# Patient Record
Sex: Female | Born: 1955
Health system: Southern US, Community
[De-identification: ages and names within clinical notes are randomized; demographics above are authoritative.]

## PROBLEM LIST (undated history)

## (undated) DIAGNOSIS — B2790 Infectious mononucleosis, unspecified without complication: Secondary | ICD-10-CM

## (undated) DIAGNOSIS — Z8719 Personal history of other diseases of the digestive system: Secondary | ICD-10-CM

## (undated) DIAGNOSIS — J40 Bronchitis, not specified as acute or chronic: Secondary | ICD-10-CM

## (undated) DIAGNOSIS — E785 Hyperlipidemia, unspecified: Secondary | ICD-10-CM

## (undated) DIAGNOSIS — E119 Type 2 diabetes mellitus without complications: Secondary | ICD-10-CM

## (undated) DIAGNOSIS — N200 Calculus of kidney: Secondary | ICD-10-CM

## (undated) DIAGNOSIS — J449 Chronic obstructive pulmonary disease, unspecified: Secondary | ICD-10-CM

## (undated) DIAGNOSIS — N189 Chronic kidney disease, unspecified: Secondary | ICD-10-CM

## (undated) DIAGNOSIS — T7840XA Allergy, unspecified, initial encounter: Secondary | ICD-10-CM

## (undated) DIAGNOSIS — I10 Essential (primary) hypertension: Secondary | ICD-10-CM

## (undated) DIAGNOSIS — H269 Unspecified cataract: Secondary | ICD-10-CM

## (undated) DIAGNOSIS — E039 Hypothyroidism, unspecified: Secondary | ICD-10-CM

## (undated) DIAGNOSIS — Z87442 Personal history of urinary calculi: Secondary | ICD-10-CM

## (undated) DIAGNOSIS — G709 Myoneural disorder, unspecified: Secondary | ICD-10-CM

## (undated) DIAGNOSIS — C50919 Malignant neoplasm of unspecified site of unspecified female breast: Secondary | ICD-10-CM

## (undated) DIAGNOSIS — C801 Malignant (primary) neoplasm, unspecified: Secondary | ICD-10-CM

## (undated) DIAGNOSIS — K219 Gastro-esophageal reflux disease without esophagitis: Secondary | ICD-10-CM

## (undated) DIAGNOSIS — J189 Pneumonia, unspecified organism: Secondary | ICD-10-CM

## (undated) DIAGNOSIS — E079 Disorder of thyroid, unspecified: Secondary | ICD-10-CM

## (undated) DIAGNOSIS — E669 Obesity, unspecified: Secondary | ICD-10-CM

## (undated) HISTORY — DX: Disorder of thyroid, unspecified: E07.9

## (undated) HISTORY — DX: Obesity, unspecified: E66.9

## (undated) HISTORY — PX: BREAST BIOPSY: SHX20

## (undated) HISTORY — DX: Allergy, unspecified, initial encounter: T78.40XA

## (undated) HISTORY — DX: Chronic obstructive pulmonary disease, unspecified: J44.9

## (undated) HISTORY — DX: Hyperlipidemia, unspecified: E78.5

## (undated) HISTORY — DX: Gastro-esophageal reflux disease without esophagitis: K21.9

## (undated) HISTORY — PX: TUBAL LIGATION: SHX77

## (undated) HISTORY — DX: Bronchitis, not specified as acute or chronic: J40

## (undated) HISTORY — DX: Unspecified cataract: H26.9

## (undated) HISTORY — DX: Chronic kidney disease, unspecified: N18.9

## (undated) HISTORY — PX: BREAST LUMPECTOMY: SHX2

## (undated) HISTORY — DX: Essential (primary) hypertension: I10

## (undated) HISTORY — DX: Type 2 diabetes mellitus without complications: E11.9

## (undated) HISTORY — PX: TONSILLECTOMY AND ADENOIDECTOMY: SUR1326

---

## 1999-03-11 ENCOUNTER — Other Ambulatory Visit: Admission: RE | Admit: 1999-03-11 | Discharge: 1999-03-11 | Payer: Self-pay | Admitting: Obstetrics and Gynecology

## 2000-03-15 ENCOUNTER — Other Ambulatory Visit: Admission: RE | Admit: 2000-03-15 | Discharge: 2000-03-15 | Payer: Self-pay | Admitting: Obstetrics and Gynecology

## 2001-12-13 ENCOUNTER — Encounter: Payer: Self-pay | Admitting: Obstetrics and Gynecology

## 2001-12-13 ENCOUNTER — Ambulatory Visit (HOSPITAL_COMMUNITY): Admission: RE | Admit: 2001-12-13 | Discharge: 2001-12-13 | Payer: Self-pay | Admitting: Obstetrics and Gynecology

## 2002-01-01 ENCOUNTER — Other Ambulatory Visit: Admission: RE | Admit: 2002-01-01 | Discharge: 2002-01-01 | Payer: Self-pay | Admitting: Obstetrics and Gynecology

## 2007-06-07 ENCOUNTER — Emergency Department (HOSPITAL_COMMUNITY): Admission: EM | Admit: 2007-06-07 | Discharge: 2007-06-07 | Payer: Self-pay | Admitting: Emergency Medicine

## 2007-06-14 ENCOUNTER — Emergency Department (HOSPITAL_COMMUNITY): Admission: EM | Admit: 2007-06-14 | Discharge: 2007-06-14 | Payer: Self-pay | Admitting: Emergency Medicine

## 2007-06-20 ENCOUNTER — Ambulatory Visit (HOSPITAL_COMMUNITY): Admission: RE | Admit: 2007-06-20 | Discharge: 2007-06-20 | Payer: Self-pay | Admitting: Internal Medicine

## 2007-06-22 ENCOUNTER — Ambulatory Visit (HOSPITAL_COMMUNITY): Admission: RE | Admit: 2007-06-22 | Discharge: 2007-06-22 | Payer: Self-pay | Admitting: Internal Medicine

## 2007-12-06 ENCOUNTER — Ambulatory Visit (HOSPITAL_COMMUNITY): Admission: RE | Admit: 2007-12-06 | Discharge: 2007-12-06 | Payer: Self-pay | Admitting: Obstetrics and Gynecology

## 2008-12-10 ENCOUNTER — Ambulatory Visit (HOSPITAL_COMMUNITY): Admission: RE | Admit: 2008-12-10 | Discharge: 2008-12-10 | Payer: Self-pay | Admitting: Obstetrics and Gynecology

## 2009-02-15 HISTORY — PX: VAGINAL HYSTERECTOMY: SUR661

## 2009-09-30 ENCOUNTER — Ambulatory Visit (HOSPITAL_COMMUNITY): Admission: RE | Admit: 2009-09-30 | Discharge: 2009-10-01 | Payer: Self-pay | Admitting: Obstetrics and Gynecology

## 2009-09-30 ENCOUNTER — Encounter (INDEPENDENT_AMBULATORY_CARE_PROVIDER_SITE_OTHER): Payer: Self-pay | Admitting: Obstetrics and Gynecology

## 2009-12-18 ENCOUNTER — Ambulatory Visit (HOSPITAL_COMMUNITY): Admission: RE | Admit: 2009-12-18 | Discharge: 2009-12-18 | Payer: Self-pay | Admitting: Obstetrics and Gynecology

## 2010-03-30 ENCOUNTER — Other Ambulatory Visit (HOSPITAL_COMMUNITY): Payer: Self-pay | Admitting: Orthopedic Surgery

## 2010-03-30 DIAGNOSIS — M25512 Pain in left shoulder: Secondary | ICD-10-CM

## 2010-04-02 ENCOUNTER — Ambulatory Visit (HOSPITAL_COMMUNITY)
Admission: RE | Admit: 2010-04-02 | Discharge: 2010-04-02 | Disposition: A | Discharge status: Home / Self Care | Payer: 59 | Source: Ambulatory Visit | Attending: Orthopedic Surgery | Admitting: Orthopedic Surgery

## 2010-04-02 DIAGNOSIS — S42293A Other displaced fracture of upper end of unspecified humerus, initial encounter for closed fracture: Secondary | ICD-10-CM | POA: Insufficient documentation

## 2010-04-02 DIAGNOSIS — S46819A Strain of other muscles, fascia and tendons at shoulder and upper arm level, unspecified arm, initial encounter: Secondary | ICD-10-CM | POA: Insufficient documentation

## 2010-04-02 DIAGNOSIS — X58XXXA Exposure to other specified factors, initial encounter: Secondary | ICD-10-CM | POA: Insufficient documentation

## 2010-04-02 DIAGNOSIS — M25512 Pain in left shoulder: Secondary | ICD-10-CM

## 2010-04-02 DIAGNOSIS — Y939 Activity, unspecified: Secondary | ICD-10-CM | POA: Insufficient documentation

## 2010-04-02 DIAGNOSIS — Y929 Unspecified place or not applicable: Secondary | ICD-10-CM | POA: Insufficient documentation

## 2010-04-02 DIAGNOSIS — Y998 Other external cause status: Secondary | ICD-10-CM | POA: Insufficient documentation

## 2010-05-01 LAB — CBC
HCT: 34.9 % — ABNORMAL LOW (ref 36.0–46.0)
MCH: 29.5 pg (ref 26.0–34.0)
MCV: 88.3 fL (ref 78.0–100.0)
Platelets: 219 10*3/uL (ref 150–400)
RBC: 3.96 MIL/uL (ref 3.87–5.11)
RDW: 13.3 % (ref 11.5–15.5)
WBC: 11.8 10*3/uL — ABNORMAL HIGH (ref 4.0–10.5)

## 2010-05-01 LAB — SURGICAL PCR SCREEN
MRSA, PCR: NEGATIVE
Staphylococcus aureus: NEGATIVE

## 2010-05-01 LAB — URINE CULTURE
Colony Count: NO GROWTH
Culture  Setup Time: 201108171112

## 2010-05-28 ENCOUNTER — Inpatient Hospital Stay (INDEPENDENT_AMBULATORY_CARE_PROVIDER_SITE_OTHER)
Admission: RE | Admit: 2010-05-28 | Discharge: 2010-05-28 | Disposition: A | Discharge status: Home / Self Care | Payer: Commercial Managed Care - PPO | Source: Ambulatory Visit | Attending: Emergency Medicine | Admitting: Emergency Medicine

## 2010-05-28 DIAGNOSIS — W19XXXA Unspecified fall, initial encounter: Secondary | ICD-10-CM

## 2010-05-28 DIAGNOSIS — S01501A Unspecified open wound of lip, initial encounter: Secondary | ICD-10-CM

## 2010-11-10 ENCOUNTER — Other Ambulatory Visit (HOSPITAL_COMMUNITY): Payer: Self-pay | Admitting: Obstetrics and Gynecology

## 2010-11-10 DIAGNOSIS — Z1231 Encounter for screening mammogram for malignant neoplasm of breast: Secondary | ICD-10-CM

## 2010-11-10 LAB — CSF CELL COUNT WITH DIFFERENTIAL
RBC Count, CSF: 3025 — ABNORMAL HIGH
Tube #: 1
WBC, CSF: 1

## 2010-11-10 LAB — POCT I-STAT, CHEM 8
BUN: 24 — ABNORMAL HIGH
Chloride: 106
HCT: 44
Potassium: 4.2
Sodium: 138

## 2010-11-10 LAB — CBC
Hemoglobin: 14.6
MCHC: 33.7
RDW: 12.8

## 2010-11-10 LAB — GRAM STAIN

## 2010-11-10 LAB — CSF CULTURE W GRAM STAIN: Culture: NO GROWTH

## 2010-11-10 LAB — DIFFERENTIAL
Basophils Absolute: 0.1
Basophils Relative: 1
Lymphocytes Relative: 40
Neutro Abs: 4.7
Neutrophils Relative %: 49

## 2010-11-10 LAB — PROTEIN AND GLUCOSE, CSF: Total  Protein, CSF: 42

## 2010-12-29 ENCOUNTER — Ambulatory Visit (HOSPITAL_COMMUNITY)
Admission: RE | Admit: 2010-12-29 | Discharge: 2010-12-29 | Disposition: A | Discharge status: Home / Self Care | Payer: 59 | Source: Ambulatory Visit | Attending: Obstetrics and Gynecology | Admitting: Obstetrics and Gynecology

## 2010-12-29 DIAGNOSIS — Z1231 Encounter for screening mammogram for malignant neoplasm of breast: Secondary | ICD-10-CM | POA: Insufficient documentation

## 2011-01-11 ENCOUNTER — Encounter: Payer: 59 | Attending: Internal Medicine | Admitting: Dietician

## 2011-01-11 ENCOUNTER — Encounter: Payer: Self-pay | Admitting: Dietician

## 2011-01-11 DIAGNOSIS — Z713 Dietary counseling and surveillance: Secondary | ICD-10-CM | POA: Insufficient documentation

## 2011-01-11 DIAGNOSIS — I10 Essential (primary) hypertension: Secondary | ICD-10-CM | POA: Insufficient documentation

## 2011-01-11 DIAGNOSIS — E785 Hyperlipidemia, unspecified: Secondary | ICD-10-CM | POA: Insufficient documentation

## 2011-01-11 DIAGNOSIS — E119 Type 2 diabetes mellitus without complications: Secondary | ICD-10-CM | POA: Insufficient documentation

## 2011-01-11 NOTE — Progress Notes (Signed)
  Medical Nutrition Therapy:  Appt start time: 1530 end time:  1700.  Assessment:  Primary concerns today: Blood glucose control and history of pre-diabetes.  Has a desire to learn more about taking care of her diabetes, loosing weight and getting her blood glucose under control.  Current HgA1C is 5.7%.  She is looking to get this number lower.  Gives a history of having lost 24 lb since August 2012.  Seeing Dr. Allena Napoleon and is on a regimen for weight loss and supplements to assist in loosing weight and obtaining a healthy state.  Her husband has done a great deal of reading regarding good glucose control, and is trying to utilized the advice of Dr. Valora Corporal  for glucose control.  She reports that he has been successful with the low carb, lean meat approach for glucose control.  She is seeking to eat healthy, but needs some education regarding label reading and food prep and meal planning. Use organic products when affordable.   MEDICATIONS: Diabetes:  Metformin ER 500 mg twice daily.  DIETARY INTAKE:  24-hr recall:  B (6:00-9:00 AM): Protein Smoothie containing whey protein powder, 1 cup of berries, and flax seed.  Estimated in the past that this was 40 gm of CHO.  Snk (Mid- AM) :Usually does not have a snack at this time.  L (Mid-day): Lean meat and a salad or steamed veggies.  Snk (Mid - PM): 4 oz organic apple D (6:00-8:00 PM): Lean meat, steamed non-starchy vegetable, side salad with a dressing. Or Masa Lubin have a cole slaw with a dressing.  Snk (9 PM): usually no snack. Beverages: water or coffee with a small serving of 1/2 and !/2. Recent physical activity: Brisk walk with husband for 60 minutes 5 times per week.  Estimated energy needs:64 inches and 169.3 lb.  Adjusted weight=136 lb. (62 kg) Approximately 1300 calories-1400 calories for weight loss.  Will aim more for 1300-1350.   135-145 g carbohydrates per day 95-100 g protein 34-36 g fat  Progress Towards Goal(s):  In  progress.   Nutritional Diagnosis:  [place]-2.1 Inpaired nutrition utilization As related to glucose.  As evidenced by fasting glucose greater than 100 mg..    Intervention:  Nutrition Increase label reading and continue to increase the complex CHO in the diet.  Use high fiber grains and cereals and more of the non-starchy vegetables and the lower glycemic fruits.  Practice measuring foods at home and use the information when eating at the cafeteria.    Handouts given during visit include:  ADA Living Well with Diabetes  Novo Nordisk Carbohydrate Counting Guide.  Diabetes/Glucose Control Work Building surveyor.  Food log for counting carbs.  Monitoring/Evaluation:  Dietary intake, exercise, blood glucose records , and body weight in 3-6 months.

## 2011-01-11 NOTE — Patient Instructions (Signed)
   Continue to read your food labels.  Try to eat regular meals and snacks.  Try to keep your carb to 15-30 gm of carb for meals and 15 gm for snacks.  Use your yellow card for carb counting and the carb counting book for references.  Continue to work at [employer] and snacks.  Plan to cook ahead.  Keep records of the food you ate and the effects on your glucose level.  This will help for planning.  Keep up your good work, you and your husband have made a good start.  Always have fiber in your foods.  For breads and cereals, the higher the fiber, the better for your glucose levels.  On the food label, keep the sugar at 0-9 gms per serving.  Continue to use the Splenda and Stevia in moderation.  When you loose your weight, and they explore the use of small amounts of Agava Nector for sweetening.  Pardon my spelling.  Continue your regular walking.  Plan to touch base with me sometime in the next 3-6 months.  E-mail me with the results of your A1C.

## 2011-02-01 ENCOUNTER — Ambulatory Visit: Payer: 59 | Admitting: Physical Therapy

## 2011-02-04 ENCOUNTER — Ambulatory Visit: Payer: 59 | Attending: Orthopedic Surgery | Admitting: Physical Therapy

## 2011-02-04 DIAGNOSIS — M25619 Stiffness of unspecified shoulder, not elsewhere classified: Secondary | ICD-10-CM | POA: Insufficient documentation

## 2011-02-04 DIAGNOSIS — M25519 Pain in unspecified shoulder: Secondary | ICD-10-CM | POA: Insufficient documentation

## 2011-02-04 DIAGNOSIS — IMO0001 Reserved for inherently not codable concepts without codable children: Secondary | ICD-10-CM | POA: Insufficient documentation

## 2011-02-22 ENCOUNTER — Ambulatory Visit: Payer: 59 | Attending: Orthopedic Surgery | Admitting: Physical Therapy

## 2011-02-22 DIAGNOSIS — IMO0001 Reserved for inherently not codable concepts without codable children: Secondary | ICD-10-CM | POA: Insufficient documentation

## 2011-02-22 DIAGNOSIS — M25619 Stiffness of unspecified shoulder, not elsewhere classified: Secondary | ICD-10-CM | POA: Insufficient documentation

## 2011-02-22 DIAGNOSIS — M25519 Pain in unspecified shoulder: Secondary | ICD-10-CM | POA: Insufficient documentation

## 2011-02-24 ENCOUNTER — Ambulatory Visit: Payer: 59 | Admitting: Physical Therapy

## 2011-03-01 ENCOUNTER — Ambulatory Visit: Payer: 59 | Admitting: Physical Therapy

## 2011-03-03 ENCOUNTER — Ambulatory Visit: Payer: 59 | Admitting: Physical Therapy

## 2011-03-10 ENCOUNTER — Encounter: Payer: 59 | Admitting: Physical Therapy

## 2011-03-15 ENCOUNTER — Ambulatory Visit: Payer: 59 | Admitting: Physical Therapy

## 2011-03-17 ENCOUNTER — Ambulatory Visit: Payer: 59 | Admitting: Physical Therapy

## 2011-03-22 ENCOUNTER — Ambulatory Visit: Payer: 59 | Attending: Orthopedic Surgery | Admitting: Physical Therapy

## 2011-03-22 DIAGNOSIS — M25619 Stiffness of unspecified shoulder, not elsewhere classified: Secondary | ICD-10-CM | POA: Insufficient documentation

## 2011-03-22 DIAGNOSIS — IMO0001 Reserved for inherently not codable concepts without codable children: Secondary | ICD-10-CM | POA: Insufficient documentation

## 2011-03-22 DIAGNOSIS — M25519 Pain in unspecified shoulder: Secondary | ICD-10-CM | POA: Insufficient documentation

## 2011-03-24 ENCOUNTER — Encounter: Payer: 59 | Admitting: Physical Therapy

## 2011-03-29 ENCOUNTER — Ambulatory Visit: Payer: 59 | Admitting: Physical Therapy

## 2011-03-31 ENCOUNTER — Ambulatory Visit: Payer: 59 | Admitting: Physical Therapy

## 2011-04-07 ENCOUNTER — Encounter: Payer: 59 | Admitting: Physical Therapy

## 2012-01-25 ENCOUNTER — Other Ambulatory Visit: Payer: Self-pay | Admitting: Obstetrics and Gynecology

## 2012-01-25 DIAGNOSIS — Z1231 Encounter for screening mammogram for malignant neoplasm of breast: Secondary | ICD-10-CM

## 2012-02-24 ENCOUNTER — Ambulatory Visit: Payer: 59 | Admitting: Obstetrics and Gynecology

## 2012-02-24 ENCOUNTER — Encounter: Payer: Self-pay | Admitting: Obstetrics and Gynecology

## 2012-02-24 ENCOUNTER — Ambulatory Visit (INDEPENDENT_AMBULATORY_CARE_PROVIDER_SITE_OTHER): Payer: 59 | Admitting: Obstetrics and Gynecology

## 2012-02-24 VITALS — BP 110/62 | Ht 63.51 in | Wt 157.0 lb

## 2012-02-24 DIAGNOSIS — N951 Menopausal and female climacteric states: Secondary | ICD-10-CM

## 2012-02-24 DIAGNOSIS — Z01419 Encounter for gynecological examination (general) (routine) without abnormal findings: Secondary | ICD-10-CM

## 2012-02-24 DIAGNOSIS — Z90710 Acquired absence of both cervix and uterus: Secondary | ICD-10-CM | POA: Insufficient documentation

## 2012-02-24 NOTE — Progress Notes (Signed)
Subjective:    Michelle [name] is a 57 y.o. female . who presents for annual exam.  The patient has no complaints today. Her menopausal symptoms are well controlled by a bioidentical hormone regimen prescribed by Dr. Ananias Pilgrim.  Last Pap: 01/03/2009 WNL: Yes Regular Periods:no Contraception: BTL, hyst  Monthly Breast exam:yes Tetanus<63yrs:yes Nl.Bladder Function:yes Daily BMs:yes Healthy Diet:yes Calcium:yes Mammogram:yes Date of Mammogram: 01/01/2011 Mammo sched next week Exercise:yes Have often Exercise: 5x/weekly Seatbelt: yes Abuse at home: no Stressful work:no Sigmoid-colonoscopy: none Bone Density: Yes 03/02/2010-osteopenia Pt has to have DEXA sched thru [facility] per ins co. PCP: Dr. Ananias Pilgrim Change in PMH: no change Change in FMH:no change BMI-28  The following portions of the patient's history were reviewed and updated as appropriate: allergies, current medications, past family history, past medical history, past social history, past surgical history and problem list.  Review of Systems Pertinent items are noted in HPI. Gastrointestinal:No change in bowel habits, no abdominal pain, no rectal bleeding Genitourinary:negative for dysuria, frequency, hematuria, nocturia and urinary incontinence    Objective:     BP 110/62  Ht 5' 3.51" (1.613 m)  Wt 157 lb (71.215 kg)  BMI 27.37 kg/m2  Weight:  Wt Readings from Last 1 Encounters:  02/24/12 157 lb (71.215 kg)     BMI: Body mass index is 27.37 kg/(m^2). General Appearance: Alert, appropriate appearance for age. No acute distress HEENT: Grossly normal Neck / Thyroid: Supple, no masses, nodes or enlargement Lungs: clear to auscultation bilaterally Back: No CVA tenderness Breast Exam: No masses or nodes.No dimpling, nipple retraction or discharge. Cardiovascular: Regular rate and rhythm. S1, S2, no murmur Gastrointestinal: Soft, non-tender, no masses or organomegaly Pelvic Exam: External genitalia: normal general  appearance Vaginal: atrophic mucosa and vaginal vault, Well healed and well suspended Cervix: removed surgically Adnexa: No palpable masses Uterus: removed surgically Rectovaginal: normal rectal, no masses Lymphatic Exam: Non-palpable nodes in neck, clavicular, axillary, or inguinal regions Skin: no rash or abnormalities Neurologic: Normal gait and speech, no tremor  Psychiatric: Alert and oriented, appropriate affect.    Urinalysis:Not done    Assessment:    Normal gyn exam Hormone replacement therapy, combination of estrogen, progesterone, and asked Dr. Ferne Reus compounded by Dr. Marilynne Halsted Menopause    Plan:   mammogram return annually or prn    Dierdre Forth MD

## 2012-02-29 ENCOUNTER — Ambulatory Visit (HOSPITAL_COMMUNITY)
Admission: RE | Admit: 2012-02-29 | Discharge: 2012-02-29 | Disposition: A | Discharge status: Home / Self Care | Payer: 59 | Source: Ambulatory Visit | Attending: Obstetrics and Gynecology | Admitting: Obstetrics and Gynecology

## 2012-02-29 ENCOUNTER — Ambulatory Visit (HOSPITAL_COMMUNITY): Payer: 59

## 2012-02-29 DIAGNOSIS — Z1231 Encounter for screening mammogram for malignant neoplasm of breast: Secondary | ICD-10-CM | POA: Insufficient documentation

## 2013-02-22 ENCOUNTER — Other Ambulatory Visit: Payer: Self-pay | Admitting: Obstetrics and Gynecology

## 2013-02-22 DIAGNOSIS — M8580 Other specified disorders of bone density and structure, unspecified site: Secondary | ICD-10-CM

## 2013-02-22 DIAGNOSIS — Z1231 Encounter for screening mammogram for malignant neoplasm of breast: Secondary | ICD-10-CM

## 2013-03-06 ENCOUNTER — Ambulatory Visit (HOSPITAL_COMMUNITY)
Admission: RE | Admit: 2013-03-06 | Discharge: 2013-03-06 | Disposition: A | Discharge status: Home / Self Care | Payer: 59 | Source: Ambulatory Visit | Attending: Obstetrics and Gynecology | Admitting: Obstetrics and Gynecology

## 2013-03-06 DIAGNOSIS — Z1231 Encounter for screening mammogram for malignant neoplasm of breast: Secondary | ICD-10-CM | POA: Insufficient documentation

## 2013-03-06 DIAGNOSIS — M8580 Other specified disorders of bone density and structure, unspecified site: Secondary | ICD-10-CM

## 2013-03-26 ENCOUNTER — Ambulatory Visit (INDEPENDENT_AMBULATORY_CARE_PROVIDER_SITE_OTHER): Payer: 59 | Admitting: Internal Medicine

## 2013-03-26 ENCOUNTER — Encounter: Payer: Self-pay | Admitting: Internal Medicine

## 2013-03-26 VITALS — BP 147/71 | HR 103 | Temp 98.2°F | Resp 18 | Ht 63.0 in | Wt 165.0 lb

## 2013-03-26 DIAGNOSIS — M8580 Other specified disorders of bone density and structure, unspecified site: Secondary | ICD-10-CM | POA: Insufficient documentation

## 2013-03-26 DIAGNOSIS — I10 Essential (primary) hypertension: Secondary | ICD-10-CM | POA: Insufficient documentation

## 2013-03-26 DIAGNOSIS — E1169 Type 2 diabetes mellitus with other specified complication: Secondary | ICD-10-CM | POA: Insufficient documentation

## 2013-03-26 DIAGNOSIS — Z139 Encounter for screening, unspecified: Secondary | ICD-10-CM

## 2013-03-26 DIAGNOSIS — M949 Disorder of cartilage, unspecified: Secondary | ICD-10-CM

## 2013-03-26 DIAGNOSIS — M899 Disorder of bone, unspecified: Secondary | ICD-10-CM

## 2013-03-26 DIAGNOSIS — E785 Hyperlipidemia, unspecified: Secondary | ICD-10-CM | POA: Insufficient documentation

## 2013-03-26 DIAGNOSIS — N951 Menopausal and female climacteric states: Secondary | ICD-10-CM

## 2013-03-26 DIAGNOSIS — E039 Hypothyroidism, unspecified: Secondary | ICD-10-CM | POA: Insufficient documentation

## 2013-03-26 NOTE — Progress Notes (Signed)
Subjective:    Patient ID: Michelle [name], female    DOB: [birth year], 58 y.o.   MRN: [id number]  HPI  Lester is here for first visit  Primary care.  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   [facility]  "borderline Diabetes" diagnosed Dr.VAughn,  HTN (on no meds now ),  Hyperlipidemia,  Symptomatic menopause  (on compounded HT Dr. Sharol Roussel),  Osteopenia  ,  Hypothyroidism  (Dx Dr. Sharol Roussel by bloodwork)  She is S/P hysterectomy signficant uterine prolapse per pt report  On HT for approx 2 years per pt report.   HTN:  She tells me she was able to lose over 20 lbs in the last year and subsequently able to stop her BP medication.  Since Thanksgiving she has gained 10 lbs back.  She is asymptomatic  She has never had a colonoscopy  Allergies  Allergen Reactions  . Shrimp [Shellfish Allergy] Itching    Allergic to shrimp.  Wheezing and tightness in chest.  . Tdap [Diphth-Acell Pertussis-Tetanus] Other (See Comments)    Change in level of consciousness  . Influenza Vaccine Live     Sever flu symptoms, chest tightness, wheezing  . Latex Rash  . Vitamin B12     Swelling in extremity and general malize.   Past Medical History  Diagnosis Date  . Asthma   . Hyperlipidemia   . Hypertension   . Thyroid disease   . Obesity    Past Surgical History  Procedure Laterality Date  . Tonsillectomy and adenoidectomy  childhood  . Vaginal hysterectomy    . Tubal ligation     History   Social History  . Marital Status: Married    Spouse Name: N/A    Number of Children: N/A  . Years of Education: N/A   Occupational History  . Not on file.   Social History Main Topics  . Smoking status: Never Smoker   . Smokeless tobacco: Never Used  . Alcohol Use: No  . Drug Use: No  . Sexual Activity: Not on file   Other Topics Concern  . Not on file   Social History Narrative  . No narrative on file   Family History  Problem Relation Age of Onset  . Heart disease Mother   .  Hyperlipidemia Mother   . Stroke Mother   . Hypertension Mother    Patient Active Problem List   Diagnosis Date Noted  . Menopausal symptoms 02/24/2012  . Status post hysterectomy 02/24/2012   Current Outpatient Prescriptions on File Prior to Visit  Medication Sig Dispense Refill  . ALPHA LIPOIC ACID PO Take 1-2 capsules by mouth 2 (two) times daily.        . AMBULATORY NON FORMULARY MEDICATION Medication Name: Biest/Progesterone/Testosterone/DHEA 2.5/250/0.5/2mg       . aspirin 81 MG EC tablet Take 81 mg by mouth daily. Swallow whole.       . Cholecalciferol (VITAMIN D) 2000 UNITS tablet Take 2,000 Units by mouth daily.        . metformin (FORTAMET) 500 MG (OSM) 24 hr tablet Take 500 mg by mouth 2 (two) times daily with a meal.       . MULTIPLE VITAMIN PO Take 1 tablet by mouth daily.        . Omega 3-6-9 Fatty Acids LIQD Take 10 mLs by mouth daily.        Marland Kitchen PROBIOTIC CAPS Take 1 capsule by mouth daily.        Marland Kitchen  simvastatin (ZOCOR) 40 MG tablet Take 20 mg by mouth at bedtime.        No current facility-administered medications on file prior to visit.      Review of Systems See HPI    Objective:   Physical Exam Physical Exam  Nursing note and vitals reviewed.  Constitutional: She is oriented to person, place, and time. She appears well-developed and well-nourished.  HENT:  Head: Normocephalic and atraumatic.  Cardiovascular: Normal rate and regular rhythm. Exam reveals no gallop and no friction rub.  No murmur heard.  Pulmonary/Chest: Breath sounds normal. She has no wheezes. She has no rales.  Neurological: She is alert and oriented to person, place, and time.  Skin: Skin is warm and dry.  Psychiatric: She has a normal mood and affect. Her behavior is normal.          Assessment & Plan:  Hyperlipidemia  Will check fasting levels  HTN  She is offf meds now repeat BP  142/80.  Will moniter for now  Symptomatic menopause:  I counseled pt that I prescribe FDA approved  HT and do not prescribe compounded HT.   Explained that we do not know long term risks of testosterone in women.  She wishes to conitnue with Dr. Sharol Roussel for this.  S/P hysterectomy  Osteopenia  03/06/2013  Normal dexa  She has never had a colonoscopy.  She would like to see a woman.  Will refer.   She is to schedule CPE with me

## 2013-03-26 NOTE — Patient Instructions (Signed)
Schedule CPE

## 2013-03-27 ENCOUNTER — Telehealth: Payer: Self-pay | Admitting: *Deleted

## 2013-03-27 ENCOUNTER — Telehealth: Payer: Self-pay | Admitting: Internal Medicine

## 2013-03-27 DIAGNOSIS — Z1211 Encounter for screening for malignant neoplasm of colon: Secondary | ICD-10-CM

## 2013-03-27 NOTE — Telephone Encounter (Signed)
Called pt regarding a referral to GI for colonoscopy. Pt was unable to provide appt info for referral and states that she will call back

## 2013-03-27 NOTE — Telephone Encounter (Signed)
error 

## 2013-03-27 NOTE — Telephone Encounter (Signed)
duplicate

## 2013-03-27 NOTE — Telephone Encounter (Signed)
Michelle [name]  I spoke with pt about getting a screening colonoscopy  - she has never had one at the age of 66.    She wanted to see a female.  Please call pt and see if you can set up with Dr. Collene Mares or Dr. Olevia Perches of [place] back to me her response

## 2013-03-27 NOTE — Telephone Encounter (Signed)
Pt returned call would like to schedule her colonoscopy in May. Referral completed in Epic. Dr Nichola Sizer schedule for colonoscopies only goes out until April. Pt was given the number and instructed to call in March to schedule her appt.

## 2013-12-17 ENCOUNTER — Encounter: Payer: Self-pay | Admitting: Internal Medicine

## 2014-01-07 ENCOUNTER — Encounter (HOSPITAL_COMMUNITY): Payer: 59

## 2014-01-07 ENCOUNTER — Encounter: Payer: 59 | Admitting: Surgery

## 2014-02-19 ENCOUNTER — Other Ambulatory Visit (HOSPITAL_COMMUNITY): Payer: Self-pay | Admitting: Obstetrics and Gynecology

## 2014-02-19 DIAGNOSIS — Z1231 Encounter for screening mammogram for malignant neoplasm of breast: Secondary | ICD-10-CM

## 2014-02-28 ENCOUNTER — Encounter: Payer: Self-pay | Admitting: *Deleted

## 2014-03-11 ENCOUNTER — Encounter: Payer: Self-pay | Admitting: Internal Medicine

## 2014-03-11 ENCOUNTER — Ambulatory Visit (INDEPENDENT_AMBULATORY_CARE_PROVIDER_SITE_OTHER): Payer: 59 | Admitting: Internal Medicine

## 2014-03-11 ENCOUNTER — Encounter: Payer: Self-pay | Admitting: *Deleted

## 2014-03-11 VITALS — BP 154/80 | HR 106 | Temp 98.6°F | Resp 18 | Ht 63.5 in | Wt 170.0 lb

## 2014-03-11 DIAGNOSIS — R0602 Shortness of breath: Secondary | ICD-10-CM

## 2014-03-11 DIAGNOSIS — R0789 Other chest pain: Secondary | ICD-10-CM

## 2014-03-11 DIAGNOSIS — R05 Cough: Secondary | ICD-10-CM

## 2014-03-11 DIAGNOSIS — R059 Cough, unspecified: Secondary | ICD-10-CM

## 2014-03-11 DIAGNOSIS — R7303 Prediabetes: Secondary | ICD-10-CM | POA: Insufficient documentation

## 2014-03-11 MED ORDER — PREDNISONE 20 MG PO TABS: ORAL_TABLET | ORAL | Status: DC

## 2014-03-11 MED ORDER — ALBUTEROL SULFATE HFA 108 (90 BASE) MCG/ACT IN AERS: INHALATION_SPRAY | RESPIRATORY_TRACT | Status: DC

## 2014-03-11 MED ORDER — METHYLPREDNISOLONE ACETATE 80 MG/ML IJ SUSP
160.0000 mg | Freq: Once | INTRAMUSCULAR | Status: AC
  Administered 2014-03-11: 160 mg via INTRAMUSCULAR

## 2014-03-11 MED ORDER — ALBUTEROL SULFATE (2.5 MG/3ML) 0.083% IN NEBU
2.5000 mg | INHALATION_SOLUTION | Freq: Once | RESPIRATORY_TRACT | Status: AC
  Administered 2014-03-11: 2.5 mg via RESPIRATORY_TRACT

## 2014-03-11 MED ORDER — AZITHROMYCIN 250 MG PO TABS: ORAL_TABLET | ORAL | Status: DC

## 2014-03-11 NOTE — Progress Notes (Signed)
Subjective:    Patient ID: Michelle [name], female    DOB: 1955/03/15, 59 y.o.   MRN: [id number]  HPI  For past 10 days increasing cough yellow sputum,  Wheezing and SOB.  No documented fever no chest pain   Years ago was told she had Asthma.   Has been getting care through [facility] and trying "natural meds"  See BP  Mother has htn.  Pt was on BP med but stopped  "after Dr. Sharol Roussel fixed my menopause"     Has not had any BP meds in several years  Allergies  Allergen Reactions  . Shrimp [Shellfish Allergy] Itching    Allergic to shrimp.  Wheezing and tightness in chest.  . Tdap [Diphth-Acell Pertussis-Tetanus] Other (See Comments)    Change in level of consciousness  . Influenza Vaccine Live     Sever flu symptoms, chest tightness, wheezing  . Latex Rash  . Vitamin B12     Swelling in extremity and general malize.   Past Medical History  Diagnosis Date  . Asthma   . Hyperlipidemia   . Hypertension   . Thyroid disease   . Obesity    Past Surgical History  Procedure Laterality Date  . Tonsillectomy and adenoidectomy  childhood  . Vaginal hysterectomy    . Tubal ligation     History   Social History  . Marital Status: Married    Spouse Name: N/A    Number of Children: N/A  . Years of Education: N/A   Occupational History  . Not on file.   Social History Main Topics  . Smoking status: Never Smoker   . Smokeless tobacco: Never Used  . Alcohol Use: No  . Drug Use: No  . Sexual Activity: Yes   Other Topics Concern  . Not on file   Social History Narrative   Family History  Problem Relation Age of Onset  . Heart disease Mother   . Hyperlipidemia Mother   . Stroke Mother   . Hypertension Mother    Patient Active Problem List   Diagnosis Date Noted  . Borderline diabetes  DX Dr Sharol Roussel 03/11/2014  . HTN (hypertension) 03/26/2013  . Hyperlipidemia 03/26/2013  . Osteopenia 03/26/2013  . Hypothyroidism 03/26/2013  . Menopausal symptoms 02/24/2012  .  Status post hysterectomy 02/24/2012   Current Outpatient Prescriptions on File Prior to Visit  Medication Sig Dispense Refill  . ALPHA LIPOIC ACID PO Take 1-2 capsules by mouth 2 (two) times daily.      . AMBULATORY NON FORMULARY MEDICATION Medication Name: Biest/Progesterone/Testosterone/DHEA 2.5/250/0.5/2mg     . aspirin 81 MG EC tablet Take 81 mg by mouth daily. Swallow whole.     . Calcium-Magnesium (CAL-MAG PO) Take 1 tablet by mouth daily.    . Cholecalciferol (VITAMIN D) 2000 UNITS tablet Take 2,000 Units by mouth daily.      . metformin (FORTAMET) 500 MG (OSM) 24 hr tablet Take 500 mg by mouth 2 (two) times daily with a meal.     . MULTIPLE VITAMIN PO Take 1 tablet by mouth daily.      . NONFORMULARY OR COMPOUNDED ITEM Take 1 tablet by mouth 2 (two) times daily. GTA Forte    . Omega 3-6-9 Fatty Acids LIQD Take 10 mLs by mouth daily.      Marland Kitchen PROBIOTIC CAPS Take 1 capsule by mouth daily.      . simvastatin (ZOCOR) 20 MG tablet Take 20 mg by mouth daily.  No current facility-administered medications on file prior to visit.      Review of Systems See HPI    Objective:   Physical Exam  Physical Exam  Nursing note and vitals reviewed.  Peak flow 280  Pulse ox 98% Constitutional: She is oriented to person, place, and time. She appears well-developed and well-nourished.  HENT:  Head: Normocephalic and atraumatic.  Cardiovascular: Normal rate and regular rhythm. Exam reveals no gallop and no friction rub.  No murmur heard.  Pulmonary/Chest: Breath sounds normal. She has end-exp wheezes.  . She has no rales.  Neurological: She is alert and oriented to person, place, and time.  Skin: Skin is warm and dry.  Psychiatric: She has a normal mood and affect. Her behavior is normal.             Assessment & Plan:  Bronchospasm  Will give depomedrol 160 mg in office with prednisone taper.  Restart albuterol 2 inhalations tid  See me in 2-3 days  I counseled pt that if any  worsening SOB or waking at night SOB to go to ER .  She voices understanding.  She does not like to take RX meds so I hope she will take what is prescribed  Bronchitis  Z-pak  Cough  See above  SOB  EKG today SR no acute changes to indicate  ACS

## 2014-03-12 ENCOUNTER — Ambulatory Visit (HOSPITAL_COMMUNITY): Payer: 59

## 2014-03-12 ENCOUNTER — Encounter: Payer: Self-pay | Admitting: Internal Medicine

## 2014-03-12 NOTE — Patient Instructions (Signed)
See me in 2-3 days

## 2014-03-14 ENCOUNTER — Emergency Department (HOSPITAL_BASED_OUTPATIENT_CLINIC_OR_DEPARTMENT_OTHER)
Admission: EM | Admit: 2014-03-14 | Discharge: 2014-03-14 | Disposition: A | Discharge status: Home / Self Care | Payer: 59 | Attending: Emergency Medicine | Admitting: Emergency Medicine

## 2014-03-14 ENCOUNTER — Ambulatory Visit (INDEPENDENT_AMBULATORY_CARE_PROVIDER_SITE_OTHER): Payer: 59 | Admitting: Internal Medicine

## 2014-03-14 ENCOUNTER — Emergency Department (HOSPITAL_BASED_OUTPATIENT_CLINIC_OR_DEPARTMENT_OTHER): Payer: 59

## 2014-03-14 ENCOUNTER — Encounter: Payer: Self-pay | Admitting: Internal Medicine

## 2014-03-14 ENCOUNTER — Encounter (HOSPITAL_BASED_OUTPATIENT_CLINIC_OR_DEPARTMENT_OTHER): Payer: Self-pay | Admitting: *Deleted

## 2014-03-14 VITALS — BP 167/74 | HR 100 | Resp 18 | Ht 63.5 in | Wt 170.0 lb

## 2014-03-14 DIAGNOSIS — Z91040 Latex allergy status: Secondary | ICD-10-CM | POA: Diagnosis not present

## 2014-03-14 DIAGNOSIS — E785 Hyperlipidemia, unspecified: Secondary | ICD-10-CM | POA: Diagnosis not present

## 2014-03-14 DIAGNOSIS — N200 Calculus of kidney: Secondary | ICD-10-CM | POA: Insufficient documentation

## 2014-03-14 DIAGNOSIS — J9801 Acute bronchospasm: Secondary | ICD-10-CM | POA: Diagnosis not present

## 2014-03-14 DIAGNOSIS — R0602 Shortness of breath: Secondary | ICD-10-CM | POA: Diagnosis present

## 2014-03-14 DIAGNOSIS — Z79899 Other long term (current) drug therapy: Secondary | ICD-10-CM | POA: Insufficient documentation

## 2014-03-14 DIAGNOSIS — Z7982 Long term (current) use of aspirin: Secondary | ICD-10-CM | POA: Diagnosis not present

## 2014-03-14 DIAGNOSIS — E669 Obesity, unspecified: Secondary | ICD-10-CM | POA: Diagnosis not present

## 2014-03-14 DIAGNOSIS — I10 Essential (primary) hypertension: Secondary | ICD-10-CM | POA: Insufficient documentation

## 2014-03-14 DIAGNOSIS — J40 Bronchitis, not specified as acute or chronic: Secondary | ICD-10-CM | POA: Insufficient documentation

## 2014-03-14 DIAGNOSIS — R079 Chest pain, unspecified: Secondary | ICD-10-CM

## 2014-03-14 LAB — BASIC METABOLIC PANEL
Anion gap: 6 (ref 5–15)
BUN: 19 mg/dL (ref 6–23)
CALCIUM: 9.5 mg/dL (ref 8.4–10.5)
CO2: 24 mmol/L (ref 19–32)
Chloride: 103 mmol/L (ref 96–112)
Creatinine, Ser: 0.65 mg/dL (ref 0.50–1.10)
GLUCOSE: 159 mg/dL — AB (ref 70–99)
Potassium: 4 mmol/L (ref 3.5–5.1)
Sodium: 133 mmol/L — ABNORMAL LOW (ref 135–145)

## 2014-03-14 LAB — CBC
HEMATOCRIT: 42.8 % (ref 36.0–46.0)
Hemoglobin: 14.3 g/dL (ref 12.0–15.0)
MCH: 28.5 pg (ref 26.0–34.0)
MCHC: 33.4 g/dL (ref 30.0–36.0)
MCV: 85.3 fL (ref 78.0–100.0)
PLATELETS: 340 10*3/uL (ref 150–400)
RBC: 5.02 MIL/uL (ref 3.87–5.11)
RDW: 13 % (ref 11.5–15.5)
WBC: 10.6 10*3/uL — ABNORMAL HIGH (ref 4.0–10.5)

## 2014-03-14 LAB — TROPONIN I: Troponin I: <0.03 ng/mL (ref <0.031)

## 2014-03-14 LAB — D-DIMER, QUANTITATIVE: D-Dimer, Quant: <0.27 ug/mL-FEU (ref 0.00–0.48)

## 2014-03-14 MED ORDER — ALBUTEROL SULFATE (2.5 MG/3ML) 0.083% IN NEBU
5.0000 mg | INHALATION_SOLUTION | Freq: Once | RESPIRATORY_TRACT | Status: AC
  Administered 2014-03-14: 5 mg via RESPIRATORY_TRACT
  Filled 2014-03-14: qty 6

## 2014-03-14 NOTE — Progress Notes (Signed)
   Subjective:    Patient ID: Michelle [name], female    DOB: 08-01-1955, 60 y.o.   MRN: [id number]  HPI  03/11/2014 Bronchospasm Will give depomedrol 160 mg in office with prednisone taper. Restart albuterol 2 inhalations tid See me in 2-3 days I counseled pt that if any worsening SOB or waking at night SOB to go to ER . She voices understanding. She does not like to take RX meds so I hope she will take what is prescribed  Bronchitis Z-pak  Cough See above  SOB EKG today SR no acute changes to indicate ACS   TODAY :  Michelle [name] returns  Some improvement but still SOB , with left sided chest pain is persistant.  Waking and night for rescue inhaler.  Using albuterol TID  See BP  She states BP at home 128/68 this am and 138/72 later on.    Review of Systems See HPI    Objective:   Physical Exam  Physical Exam  Nursing note and vitals reviewed.  Constitutional: She is oriented to person, place, and time. She appears well-developed and well-nourished.  HENT:  Head: Normocephalic and atraumatic.  Cardiovascular: Normal rate and regular rhythm. Exam reveals no gallop and no friction rub.  No murmur heard.  Pulmonary/Chest: Breath sounds normal. She has no wheezes. Rales at left base.    Neurological: She is alert and oriented to person, place, and time.  Skin: Skin is warm and dry.  Psychiatric: She has a normal mood and affect. Her behavior is normal.             Assessment & Plan:  Persistant  left sided chest pain with SOB:  Will need further eval with stat CXR d-dimer and CT angio of positive  Transfer to ER

## 2014-03-14 NOTE — ED Provider Notes (Signed)
CSN: [id number]     Arrival date & time 03/14/14  1227 History   First MD Initiated Contact with Patient 03/14/14 1311     Chief Complaint  Patient presents with  . Shortness of Breath     (Consider location/radiation/quality/duration/timing/severity/associated sxs/prior Treatment) HPI Comments: 59 year old female with a past medical history of thyroid disease, hypertension, hyperlipidemia and asthma presenting to the ED from her PCP Dr. Sharin Mons office with shortness of breath 2 weeks. Patient reports she had upper respiratory symptoms over the past 2 weeks, went to her PCP on Monday, 3 days ago and started on azithromycin, prednisone and albuterol. States that her respiratory symptoms have started to subside, however she continues to be short of breath. States she has pain in the left side of her chest and can occasionally hear some rattling. Pain worse with inspiration. She has been using albuterol inhaler every 8 hours with temporary relief. She is short of breath both at rest and on exertion. PCP sent her to the ED to rule out PE. Denies any lower extremity edema, leg pain, recent long travel or history of blood clots. Her mom has a significant history of blood clots. She has been on estrogen replacement therapy for 2 years. Patient states it has been 35 years since she's had an asthma exacerbation.  Patient is a 59 y.o. female presenting with shortness of breath. The history is provided by the patient.  Shortness of Breath Associated symptoms: chest pain and wheezing     Past Medical History  Diagnosis Date  . Asthma   . Hyperlipidemia   . Hypertension   . Thyroid disease   . Obesity    Past Surgical History  Procedure Laterality Date  . Tonsillectomy and adenoidectomy  childhood  . Vaginal hysterectomy    . Tubal ligation     Family History  Problem Relation Age of Onset  . Heart disease Mother   . Hyperlipidemia Mother   . Stroke Mother   . Hypertension Mother     History  Substance Use Topics  . Smoking status: Never Smoker   . Smokeless tobacco: Never Used  . Alcohol Use: No   OB History    Gravida Para Term Preterm AB TAB SAB Ectopic Multiple Living   7 4   3  0 3   4     Review of Systems  Respiratory: Positive for shortness of breath and wheezing.   Cardiovascular: Positive for chest pain.  All other systems reviewed and are negative.     Allergies  Shrimp; Tdap; Influenza vaccine live; Latex; and Vitamin b12  Home Medications   Prior to Admission medications   Medication Sig Start Date End Date Taking? Authorizing Provider  albuterol (PROVENTIL HFA;VENTOLIN HFA) 108 (90 BASE) MCG/ACT inhaler INhale 1 puffs into lungs every 8 hours prn wheezing or SOB 03/11/14   Lanice Shirts, MD  ALPHA LIPOIC ACID PO Take 1-2 capsules by mouth 2 (two) times daily.      Historical Provider, MD  AMBULATORY NON FORMULARY MEDICATION Medication Name: Biest/Progesterone/Testosterone/DHEA 2.5/250/0.5/2mg     Historical Provider, MD  aspirin 81 MG EC tablet Take 81 mg by mouth daily. Swallow whole.     Historical Provider, MD  azithromycin (ZITHROMAX) 250 MG tablet Take as directed 03/11/14   Lanice Shirts, MD  Calcium-Magnesium (CAL-MAG PO) Take 1 tablet by mouth daily.    Historical Provider, MD  Cholecalciferol (VITAMIN D) 2000 UNITS tablet Take 2,000 Units by mouth daily.  Historical Provider, MD  metformin (FORTAMET) 500 MG (OSM) 24 hr tablet Take 500 mg by mouth 2 (two) times daily with a meal.     Historical Provider, MD  MULTIPLE VITAMIN PO Take 1 tablet by mouth daily.      Historical Provider, MD  NONFORMULARY OR COMPOUNDED ITEM Take 1 tablet by mouth 2 (two) times daily. GTA Forte    Historical Provider, MD  Omega 3-6-9 Fatty Acids LIQD Take 10 mLs by mouth daily.      Historical Provider, MD  predniSONE (DELTASONE) 20 MG tablet Take 3 tablets in am for 3 days then 2 tablets in am for 3 days then 1 tablet in am for 3 days then  stop  Start first dose today 03/11/14   Lanice Shirts, MD  PROBIOTIC CAPS Take 1 capsule by mouth daily.      Historical Provider, MD  simvastatin (ZOCOR) 20 MG tablet Take 20 mg by mouth daily.    Historical Provider, MD   BP 168/64 mmHg  Pulse 105  Temp(Src) 97.9 F (36.6 C) (Oral)  Resp 12  Ht 5' 3.5" (1.613 m)  Wt 170 lb (77.111 kg)  BMI 29.64 kg/m2  SpO2 98% Physical Exam  Constitutional: She is oriented to person, place, and time. She appears well-developed and well-nourished. No distress.  HENT:  Head: Normocephalic and atraumatic.  Mouth/Throat: Oropharynx is clear and moist.  Eyes: Conjunctivae and EOM are normal.  Neck: Normal range of motion. Neck supple. No JVD present.  Cardiovascular: Normal rate, regular rhythm, normal heart sounds and intact distal pulses.   No extremity edema.  Pulmonary/Chest: Effort normal. No respiratory distress.  Faint expiratory wheezes left mid-upper lung fields.  Musculoskeletal: Normal range of motion. She exhibits no edema.  No calf swelling or tenderness bilateral.  Neurological: She is alert and oriented to person, place, and time. No sensory deficit.  Skin: Skin is warm and dry.  Psychiatric: She has a normal mood and affect. Her behavior is normal.  Nursing note and vitals reviewed.   ED Course  Procedures (including critical care time) Labs Review Labs Reviewed  CBC - Abnormal; Notable for the following:    WBC 10.6 (*)    All other components within normal limits  BASIC METABOLIC PANEL - Abnormal; Notable for the following:    Sodium 133 (*)    Glucose, Bld 159 (*)    All other components within normal limits  TROPONIN I  D-DIMER, QUANTITATIVE    Imaging Review Dg Chest 2 View  03/14/2014   CLINICAL DATA:  Left-sided chest pain and difficulty breathing for 3 days  EXAM: CHEST  2 VIEW  COMPARISON:  None.  FINDINGS: Lungs are clear. Heart size and pulmonary vascularity are normal. No adenopathy. No bone lesions.   IMPRESSION: No edema or consolidation.   Electronically Signed   By: Lowella Grip M.D.   On: 03/14/2014 13:13     EKG Interpretation   Date/Time:  Thursday March 14 2014 13:14:56 EST Ventricular Rate:  94 PR Interval:  142 QRS Duration: 100 QT Interval:  328 QTC Calculation: 410 R Axis:   73 Text Interpretation:  Normal sinus rhythm Normal ECG Confirmed by WARD,   DO, KRISTEN ([id number]) on 03/14/2014 1:20:56 PM      MDM   Final diagnoses:  Shortness of breath  Bronchospasm  Bronchitis   Pt in NAD. Afebrile, mildly tachycardic, vitals otherwise stable. O2 sat 98% on room air. No respiratory distress. Chest x-ray clear.  D-dimer within normal limits. Labs without any acute finding. Doubt cardiac. HEART score 3. Neb treatment provides temporary relief. I advised her to continue the azithromycin until complete along with prednisone. She will also continue albuterol inhaler. Follow-up with PCP on Monday or Tuesday, 4-5 days from now. I advised her to stay home from work over the weekend. I discussed results with her PCP over the phone. Stable for discharge. Return precautions given. Patient states understanding of treatment care plan and is agreeable.  Carman Ching, PA-C 03/14/14 [place], DO 03/14/14 [id number]

## 2014-03-14 NOTE — ED Notes (Addendum)
She was seen by Dr Coralyn Mark on Monday and treated for pain in her left lung and sob. Sob is better but not completely gone. Pain with inspiration. She was seen by her MD today for a recheck and sent here for further evaluation to r/o PE

## 2014-03-14 NOTE — Discharge Instructions (Signed)
Continue azithromycin and prednisone as prescribed by her primary care physician. Rest and do not do any exertional activity until follow-up with your primary care physician.  Bronchospasm A bronchospasm is a spasm or tightening of the airways going into the lungs. During a bronchospasm breathing becomes more difficult because the airways get smaller. When this happens there can be coughing, a whistling sound when breathing (wheezing), and difficulty breathing. Bronchospasm is often associated with asthma, but not all patients who experience a bronchospasm have asthma. CAUSES  A bronchospasm is caused by inflammation or irritation of the airways. The inflammation or irritation may be triggered by:   Allergies (such as to animals, pollen, food, or mold). Allergens that cause bronchospasm may cause wheezing immediately after exposure or many hours later.   Infection. Viral infections are believed to be the most common cause of bronchospasm.   Exercise.   Irritants (such as pollution, cigarette smoke, strong odors, aerosol sprays, and paint fumes).   Weather changes. Winds increase molds and pollens in the air. Rain refreshes the air by washing irritants out. Cold air may cause inflammation.   Stress and emotional upset.  SIGNS AND SYMPTOMS   Wheezing.   Excessive nighttime coughing.   Frequent or severe coughing with a simple cold.   Chest tightness.   Shortness of breath.  DIAGNOSIS  Bronchospasm is usually diagnosed through a history and physical exam. Tests, such as chest X-rays, are sometimes done to look for other conditions. TREATMENT   Inhaled medicines can be given to open up your airways and help you breathe. The medicines can be given using either an inhaler or a nebulizer machine.  Corticosteroid medicines may be given for severe bronchospasm, usually when it is associated with asthma. HOME CARE INSTRUCTIONS   Always have a plan prepared for seeking medical  care. Know when to call your health care provider and local emergency services (911 in the [place] Know where you can access local emergency care.  Only take medicines as directed by your health care provider.  If you were prescribed an inhaler or nebulizer machine, ask your health care provider to explain how to use it correctly. Always use a spacer with your inhaler if you were given one.  It is necessary to remain calm during an attack. Try to relax and breathe more slowly.  Control your home environment in the following ways:   Change your heating and air conditioning filter at least once a month.   Limit your use of fireplaces and wood stoves.  Do not smoke and do not allow smoking in your home.   Avoid exposure to perfumes and fragrances.   Get rid of pests (such as roaches and mice) and their droppings.   Throw away plants if you see mold on them.   Keep your house clean and dust free.   Replace carpet with wood, tile, or vinyl flooring. Carpet can trap dander and dust.   Use allergy-proof pillows, mattress covers, and box spring covers.   Wash bed sheets and blankets every week in hot water and dry them in a dryer.   Use blankets that are made of polyester or cotton.   Wash hands frequently. SEEK MEDICAL CARE IF:   You have muscle aches.   You have chest pain.   The sputum changes from clear or white to yellow, green, gray, or bloody.   The sputum you cough up gets thicker.   There are problems that may be related to the medicine  you are given, such as a rash, itching, swelling, or trouble breathing.  SEEK IMMEDIATE MEDICAL CARE IF:   You have worsening wheezing and coughing even after taking your prescribed medicines.   You have increased difficulty breathing.   You develop severe chest pain. MAKE SURE YOU:   Understand these instructions.  Will watch your condition.  Will get help right away if you are not doing well or get  worse. Document Released: 02/04/2003 Document Revised: 02/06/2013 Document Reviewed: 07/24/2012 [facility] Patient Information 2015 [place], [place]. This information is not intended to replace advice given to you by your health care provider. Make sure you discuss any questions you have with your health care provider.  Shortness of Breath Shortness of breath means you have trouble breathing. It could also mean that you have a medical problem. You should get immediate medical care for shortness of breath. CAUSES   Not enough oxygen in the air such as with high altitudes or a smoke-filled room.  Certain lung diseases, infections, or problems.  Heart disease or conditions, such as angina or heart failure.  Low red blood cells (anemia).  Poor physical fitness, which can cause shortness of breath when you exercise.  Chest or back injuries or stiffness.  Being overweight.  Smoking.  Anxiety, which can make you feel like you are not getting enough air. DIAGNOSIS  Serious medical problems can often be found during your physical exam. Tests may also be done to determine why you are having shortness of breath. Tests may include:  Chest X-rays.  Lung function tests.  Blood tests.  An electrocardiogram (ECG).  An ambulatory electrocardiogram. An ambulatory ECG records your heartbeat patterns over a 24-hour period.  Exercise testing.  A transthoracic echocardiogram (TTE). During echocardiography, sound waves are used to evaluate how blood flows through your heart.  A transesophageal echocardiogram (TEE).  Imaging scans. Your health care provider may not be able to find a cause for your shortness of breath after your exam. In this case, it is important to have a follow-up exam with your health care provider as directed.  TREATMENT  Treatment for shortness of breath depends on the cause of your symptoms and can vary greatly. HOME CARE INSTRUCTIONS   Do not smoke. Smoking is a common  cause of shortness of breath. If you smoke, ask for help to quit.  Avoid being around chemicals or things that may bother your breathing, such as paint fumes and dust.  Rest as needed. Slowly resume your usual activities.  If medicines were prescribed, take them as directed for the full length of time directed. This includes oxygen and any inhaled medicines.  Keep all follow-up appointments as directed by your health care provider. SEEK MEDICAL CARE IF:   Your condition does not improve in the time expected.  You have a hard time doing your normal activities even with rest.  You have any new symptoms. SEEK IMMEDIATE MEDICAL CARE IF:   Your shortness of breath gets worse.  You feel light-headed, faint, or develop a cough not controlled with medicines.  You start coughing up blood.  You have pain with breathing.  You have chest pain or pain in your arms, shoulders, or abdomen.  You have a fever.  You are unable to walk up stairs or exercise the way you normally do. MAKE SURE YOU:  Understand these instructions.  Will watch your condition.  Will get help right away if you are not doing well or get worse. Document Released:  10/27/2000 Document Revised: 02/06/2013 Document Reviewed: 04/19/2011 [facility] Patient Information 2015 [place], [place]. This information is not intended to replace advice given to you by your health care provider. Make sure you discuss any questions you have with your health care provider. Acute Bronchitis Bronchitis is inflammation of the airways that extend from the windpipe into the lungs (bronchi). The inflammation often causes mucus to develop. This leads to a cough, which is the most common symptom of bronchitis.  In acute bronchitis, the condition usually develops suddenly and goes away over time, usually in a couple weeks. Smoking, allergies, and asthma can make bronchitis worse. Repeated episodes of bronchitis may cause further lung problems.   CAUSES Acute bronchitis is most often caused by the same virus that causes a cold. The virus can spread from person to person (contagious) through coughing, sneezing, and touching contaminated objects. SIGNS AND SYMPTOMS   Cough.   Fever.   Coughing up mucus.   Body aches.   Chest congestion.   Chills.   Shortness of breath.   Sore throat.  DIAGNOSIS  Acute bronchitis is usually diagnosed through a physical exam. Your health care provider will also ask you questions about your medical history. Tests, such as chest X-rays, are sometimes done to rule out other conditions.  TREATMENT  Acute bronchitis usually goes away in a couple weeks. Oftentimes, no medical treatment is necessary. Medicines are sometimes given for relief of fever or cough. Antibiotic medicines are usually not needed but may be prescribed in certain situations. In some cases, an inhaler may be recommended to help reduce shortness of breath and control the cough. A cool mist vaporizer may also be used to help thin bronchial secretions and make it easier to clear the chest.  HOME CARE INSTRUCTIONS  Get plenty of rest.   Drink enough fluids to keep your urine clear or pale yellow (unless you have a medical condition that requires fluid restriction). Increasing fluids may help thin your respiratory secretions (sputum) and reduce chest congestion, and it will prevent dehydration.   Take medicines only as directed by your health care provider.  If you were prescribed an antibiotic medicine, finish it all even if you start to feel better.  Avoid smoking and secondhand smoke. Exposure to cigarette smoke or irritating chemicals will make bronchitis worse. If you are a smoker, consider using nicotine gum or skin patches to help control withdrawal symptoms. Quitting smoking will help your lungs heal faster.   Reduce the chances of another bout of acute bronchitis by washing your hands frequently, avoiding people  with cold symptoms, and trying not to touch your hands to your mouth, nose, or eyes.   Keep all follow-up visits as directed by your health care provider.  SEEK MEDICAL CARE IF: Your symptoms do not improve after 1 week of treatment.  SEEK IMMEDIATE MEDICAL CARE IF:  You develop an increased fever or chills.   You have chest pain.   You have severe shortness of breath.  You have bloody sputum.   You develop dehydration.  You faint or repeatedly feel like you are going to pass out.  You develop repeated vomiting.  You develop a severe headache. MAKE SURE YOU:   Understand these instructions.  Will watch your condition.  Will get help right away if you are not doing well or get worse. Document Released: 03/11/2004 Document Revised: 06/18/2013 Document Reviewed: 07/25/2012 [facility] Patient Information 2015 [place], [place]. This information is not intended to replace advice given to  you by your health care provider. Make sure you discuss any questions you have with your health care provider.

## 2014-03-19 ENCOUNTER — Ambulatory Visit (INDEPENDENT_AMBULATORY_CARE_PROVIDER_SITE_OTHER): Payer: 59 | Admitting: Internal Medicine

## 2014-03-19 ENCOUNTER — Encounter: Payer: Self-pay | Admitting: Internal Medicine

## 2014-03-19 VITALS — BP 138/70 | HR 101 | Resp 16 | Ht 63.5 in | Wt 171.0 lb

## 2014-03-19 DIAGNOSIS — R059 Cough, unspecified: Secondary | ICD-10-CM

## 2014-03-19 DIAGNOSIS — J9801 Acute bronchospasm: Secondary | ICD-10-CM

## 2014-03-19 DIAGNOSIS — R05 Cough: Secondary | ICD-10-CM

## 2014-03-19 DIAGNOSIS — R0602 Shortness of breath: Secondary | ICD-10-CM

## 2014-03-19 MED ORDER — MOMETASONE FURO-FORMOTEROL FUM 100-5 MCG/ACT IN AERO: INHALATION_SPRAY | RESPIRATORY_TRACT | Status: DC

## 2014-03-19 NOTE — Progress Notes (Signed)
Subjective:    Patient ID: Michelle [name], female    DOB: 12/11/1955, 59 y.o.   MRN: [id number]  HPI 03/14/2014  ER note Shortness of breath  Bronchospasm  Bronchitis   Pt in NAD. Afebrile, mildly tachycardic, vitals otherwise stable. O2 sat 98% on room air. No respiratory distress. Chest x-ray clear. D-dimer within normal limits. Labs without any acute finding. Doubt cardiac. HEART score 3. Neb treatment provides temporary relief. I advised her to continue the azithromycin until complete along with prednisone. She will also continue albuterol inhaler. Follow-up with PCP on Monday or Tuesday, 4-5 days from now. I advised her to stay home from work over the weekend. I discussed results with her PCP over the phone. Stable for discharge. Return precautions given. Patient states understanding of treatment care plan and is agreeable.  Carman Ching, PA-C 03/14/14 [place], DO  TODAY  Michelle [name] is here for follow up of bronchospasm and SOB.  Was sent to ER for eval to rule out PE  CXR neg acute process,  D-dimer and troponin neg  " I am getting better every day"  Sleeping through the night no rescue inhaler use during night  But using inhaler q4h during day.  Cough much less  But still some DOE when walking long distances  Allergies  Allergen Reactions  . Shrimp [Shellfish Allergy] Itching    Allergic to shrimp.  Wheezing and tightness in chest.  . Tdap [Diphth-Acell Pertussis-Tetanus] Other (See Comments)    Change in level of consciousness  . Influenza Vaccine Live     Sever flu symptoms, chest tightness, wheezing  . Latex Rash  . Vitamin B12     Swelling in extremity and general malize.   Past Medical History  Diagnosis Date  . Asthma   . Hyperlipidemia   . Hypertension   . Thyroid disease   . Obesity    Past Surgical History  Procedure Laterality Date  . Tonsillectomy and adenoidectomy  childhood  . Vaginal hysterectomy    . Tubal ligation     History     Social History  . Marital Status: Married    Spouse Name: N/A    Number of Children: N/A  . Years of Education: N/A   Occupational History  . Not on file.   Social History Main Topics  . Smoking status: Never Smoker   . Smokeless tobacco: Never Used  . Alcohol Use: No  . Drug Use: No  . Sexual Activity: Yes   Other Topics Concern  . Not on file   Social History Narrative   Family History  Problem Relation Age of Onset  . Heart disease Mother   . Hyperlipidemia Mother   . Stroke Mother   . Hypertension Mother    Patient Active Problem List   Diagnosis Date Noted  . Renal calculi  incidental on CT imaging 03/14/2014  . Borderline diabetes  DX Dr Sharol Roussel 03/11/2014  . HTN (hypertension) 03/26/2013  . Hyperlipidemia 03/26/2013  . Osteopenia 03/26/2013  . Hypothyroidism 03/26/2013  . Menopausal symptoms 02/24/2012  . Status post hysterectomy 02/24/2012   Current Outpatient Prescriptions on File Prior to Visit  Medication Sig Dispense Refill  . albuterol (PROVENTIL HFA;VENTOLIN HFA) 108 (90 BASE) MCG/ACT inhaler INhale 1 puffs into lungs every 8 hours prn wheezing or SOB 1 Inhaler 0  . ALPHA LIPOIC ACID PO Take 1-2 capsules by mouth 2 (two) times daily.      . AMBULATORY NON  FORMULARY MEDICATION Medication Name: Biest/Progesterone/Testosterone/DHEA 2.5/250/0.5/2mg     . aspirin 81 MG EC tablet Take 81 mg by mouth daily. Swallow whole.     Marland Kitchen azithromycin (ZITHROMAX) 250 MG tablet Take as directed 6 tablet 0  . Calcium-Magnesium (CAL-MAG PO) Take 1 tablet by mouth daily.    . Cholecalciferol (VITAMIN D) 2000 UNITS tablet Take 2,000 Units by mouth daily.      . metformin (FORTAMET) 500 MG (OSM) 24 hr tablet Take 500 mg by mouth 2 (two) times daily with a meal.     . MULTIPLE VITAMIN PO Take 1 tablet by mouth daily.      . NONFORMULARY OR COMPOUNDED ITEM Take 1 tablet by mouth 2 (two) times daily. GTA Forte    . Omega 3-6-9 Fatty Acids LIQD Take 10 mLs by mouth daily.       . predniSONE (DELTASONE) 20 MG tablet Take 3 tablets in am for 3 days then 2 tablets in am for 3 days then 1 tablet in am for 3 days then stop  Start first dose today 18 tablet 0  . PROBIOTIC CAPS Take 1 capsule by mouth daily.      . simvastatin (ZOCOR) 20 MG tablet Take 20 mg by mouth daily.     No current facility-administered medications on file prior to visit.        Review of Systems    see HPI Objective:   Physical Exam Physical Exam  Nursing note and vitals reviewed.   Repeat peak flow 400  Repeat BP 138/70 Constitutional: She is oriented to person, place, and time. She appears well-developed and well-nourished.  HENT:  Head: Normocephalic and atraumatic.  Cardiovascular: Normal rate and regular rhythm. Exam reveals no gallop and no friction rub.  No murmur heard.  Pulmonary/Chest: Breath sounds normal. She has no wheezes. She has no rales.  Neurological: She is alert and oriented to person, place, and time.  Skin: Skin is warm and dry.  Psychiatric: She has a normal mood and affect. Her behavior is normal.              Assessment & Plan:  Bronchospasm in setting of bronchitis :   Overall improved.    Will start Dulera 100/20  2 inhaltions bid.    Repeat Peak flow 400.  She has finished prednisone this am.    Cough improved   Elevaated BP  Repeat is normal   See me in 4 weeks or sooner prn

## 2014-03-26 ENCOUNTER — Ambulatory Visit (HOSPITAL_COMMUNITY)
Admission: RE | Admit: 2014-03-26 | Discharge: 2014-03-26 | Disposition: A | Discharge status: Home / Self Care | Payer: 59 | Source: Ambulatory Visit | Attending: Obstetrics and Gynecology | Admitting: Obstetrics and Gynecology

## 2014-03-26 DIAGNOSIS — Z1231 Encounter for screening mammogram for malignant neoplasm of breast: Secondary | ICD-10-CM | POA: Insufficient documentation

## 2014-04-16 ENCOUNTER — Encounter: Payer: Self-pay | Admitting: Internal Medicine

## 2014-04-16 ENCOUNTER — Ambulatory Visit (INDEPENDENT_AMBULATORY_CARE_PROVIDER_SITE_OTHER): Payer: 59 | Admitting: Internal Medicine

## 2014-04-16 VITALS — BP 180/80 | HR 98 | Resp 16 | Ht 63.5 in | Wt 171.0 lb

## 2014-04-16 DIAGNOSIS — I10 Essential (primary) hypertension: Secondary | ICD-10-CM

## 2014-04-16 DIAGNOSIS — J9801 Acute bronchospasm: Secondary | ICD-10-CM

## 2014-04-16 MED ORDER — MOMETASONE FURO-FORMOTEROL FUM 100-5 MCG/ACT IN AERO: INHALATION_SPRAY | RESPIRATORY_TRACT | Status: DC

## 2014-04-16 MED ORDER — AMLODIPINE BESYLATE 5 MG PO TABS: 5.0000 mg | ORAL_TABLET | Freq: Every day | ORAL | Status: DC

## 2014-04-16 MED ORDER — ALBUTEROL SULFATE HFA 108 (90 BASE) MCG/ACT IN AERS: INHALATION_SPRAY | RESPIRATORY_TRACT | Status: DC

## 2014-04-16 NOTE — Progress Notes (Signed)
Subjective:    Patient ID: Michelle [name], female    DOB: 23-May-1955, 59 y.o.   MRN: [id number]  HPI 03/19/2014 note Assessment & Plan:  Bronchospasm in setting of bronchitis : Overall improved. Will start Dulera 100/20 2 inhaltions bid. Repeat Peak flow 400. She has finished prednisone this am.   Cough improved   Elevaated BP Repeat is normal   See me in 4 weeks or sooner prn        TODAY  Michelle [name] returns for follow up of bronchospasm in setting of bronchitis.   She was placed on Dulera last visit.  States she is much better,  Back at work.  She tells me she is using her Dulera bid and has not needed her ventolin  See BP:  Upon further history pt tells me she was treated for HTN in 2009 for Dr. Sharol [name].  She reports she had extensive work up back then for labile HTN including checking for pheochromocytoma  2009 CT shows normal adrenals and no evidence of RAS.  Pt states she was treated with Norvasc,  HCTZ, and clonidine for approx 6 months and then stopped  She is also getting "hormone troches" from Dr. Sharol [name] and tells me she has not been back to see her in quite some time.  Unclear if troches contain testosterone.   Strong FH of Htn in mother and brother  She denies headache or chest pain, pressure   NO LE edema  Allergies  Allergen Reactions  . Shrimp [Shellfish Allergy] Itching    Allergic to shrimp.  Wheezing and tightness in chest.  . Tdap [Diphth-Acell Pertussis-Tetanus] Other (See Comments)    Change in level of consciousness  . Influenza Vaccine Live     Sever flu symptoms, chest tightness, wheezing  . Latex Rash  . Vitamin B12     Swelling in extremity and general malize.   Past Medical History  Diagnosis Date  . Asthma   . Hyperlipidemia   . Hypertension   . Thyroid disease   . Obesity    Past Surgical History  Procedure Laterality Date  . Tonsillectomy and adenoidectomy  childhood  . Vaginal hysterectomy    . Tubal ligation     History    Social History  . Marital Status: Married    Spouse Name: N/A  . Number of Children: N/A  . Years of Education: N/A   Occupational History  . Not on file.   Social History Main Topics  . Smoking status: Never Smoker   . Smokeless tobacco: Never Used  . Alcohol Use: No  . Drug Use: No  . Sexual Activity: Yes   Other Topics Concern  . Not on file   Social History Narrative   Family History  Problem Relation Age of Onset  . Heart disease Mother   . Hyperlipidemia Mother   . Stroke Mother   . Hypertension Mother    Patient Active Problem List   Diagnosis Date Noted  . Renal calculi  incidental on CT imaging 03/14/2014  . Borderline diabetes  DX Dr Michelle [name] 03/11/2014  . HTN (hypertension) 03/26/2013  . Hyperlipidemia 03/26/2013  . Osteopenia 03/26/2013  . Hypothyroidism 03/26/2013  . Menopausal symptoms 02/24/2012  . Status post hysterectomy 02/24/2012   Current Outpatient Prescriptions on File Prior to Visit  Medication Sig Dispense Refill  . albuterol (PROVENTIL HFA;VENTOLIN HFA) 108 (90 BASE) MCG/ACT inhaler INhale 1 puffs into lungs every 8 hours prn wheezing or SOB 1 Inhaler 0  .  ALPHA LIPOIC ACID PO Take 1-2 capsules by mouth 2 (two) times daily.      . AMBULATORY NON FORMULARY MEDICATION Medication Name: Biest/Progesterone/Testosterone/DHEA 2.5/250/0.5/2mg     . aspirin 81 MG EC tablet Take 81 mg by mouth daily. Swallow whole.     . Calcium-Magnesium (CAL-MAG PO) Take 1 tablet by mouth daily.    . Cholecalciferol (VITAMIN D) 2000 UNITS tablet Take 2,000 Units by mouth daily.      . metformin (FORTAMET) 500 MG (OSM) 24 hr tablet Take 500 mg by mouth 2 (two) times daily with a meal.     . mometasone-formoterol (DULERA) 100-5 MCG/ACT AERO 2 inhalations bid 1 Inhaler 1  . MULTIPLE VITAMIN PO Take 1 tablet by mouth daily.      . NONFORMULARY OR COMPOUNDED ITEM Take 1 tablet by mouth 2 (two) times daily. GTA Forte    . Omega 3-6-9 Fatty Acids LIQD Take 10 mLs by mouth  daily.      Marland Kitchen PROBIOTIC CAPS Take 1 capsule by mouth daily.      . simvastatin (ZOCOR) 20 MG tablet Take 20 mg by mouth daily.     No current facility-administered medications on file prior to visit.       Review of Systems See HPI    Objective:   Physical Exam Physical Exam  Nursing note and vitals reviewed. Peak flow 400  Constitutional: She is oriented to person, place, and time. She appears well-developed and well-nourished.  HENT:  Head: Normocephalic and atraumatic.  Cardiovascular: Normal rate and regular rhythm. Exam reveals no gallop and no friction rub.  No murmur heard.  Pulmonary/Chest: Breath sounds normal. She has no wheezes. She has no rales.  Neurological: She is alert and oriented to person, place, and time.  Skin: Skin is warm and dry.  Psychiatric: She has a normal mood and affect. Her behavior is normal.         Assessment & Plan:  HTN  I advised pt that there is likely a genetic component to her HTN.  If testosterone is in Troches this my be a contributor .  Discussed multiple complications of untreated Htn including MI,  CVA and renal failure.  Will initiate Norvasc 5 mg daily.  She has a long h/o using homeopathic remedies.  I hope she  Will take medication.   I note she did not have her chemistries and thyroid done last year.  Will get thyroid studies and all labs today   Michelle [name] will call pt   Bronchospasm   Since this is spring allergy season  Advised to use  Dulera bid for 4 weeks then if still well can taper to 2 inhalations once a day  On HT with troches  Advised to see Dr. Sharol [name] since she has not been there in a while   See me in 4 weeks or prn

## 2014-04-17 ENCOUNTER — Telehealth: Payer: Self-pay | Admitting: *Deleted

## 2014-04-17 NOTE — Telephone Encounter (Signed)
Michelle [name]'s insurance will not pay for the [facility]. She is requesting Symbicort to be called in

## 2014-04-18 MED ORDER — BUDESONIDE-FORMOTEROL FUMARATE 80-4.5 MCG/ACT IN AERO: 2.0000 | INHALATION_SPRAY | Freq: Two times a day (BID) | RESPIRATORY_TRACT | Status: DC

## 2014-04-18 NOTE — Telephone Encounter (Signed)
Patient is aware that the RX was sent to the pharmacy downstairs

## 2014-04-19 LAB — LIPID PANEL
CHOL/HDL RATIO: 2.4 ratio
CHOLESTEROL: 147 mg/dL (ref 0–200)
HDL: 61 mg/dL (ref >=46)
LDL CALC: 73 mg/dL (ref 0–99)
TRIGLYCERIDES: 65 mg/dL (ref <150)
VLDL: 13 mg/dL (ref 0–40)

## 2014-04-19 LAB — COMPREHENSIVE METABOLIC PANEL
ALT: 24 U/L (ref 0–35)
AST: 18 U/L (ref 0–37)
Albumin: 4.1 g/dL (ref 3.5–5.2)
Alkaline Phosphatase: 37 U/L — ABNORMAL LOW (ref 39–117)
BUN: 15 mg/dL (ref 6–23)
CO2: 25 mEq/L (ref 19–32)
CREATININE: 0.59 mg/dL (ref 0.50–1.10)
Calcium: 9.3 mg/dL (ref 8.4–10.5)
Chloride: 103 mEq/L (ref 96–112)
GLUCOSE: 89 mg/dL (ref 70–99)
Potassium: 4.5 mEq/L (ref 3.5–5.3)
Sodium: 139 mEq/L (ref 135–145)
Total Bilirubin: 0.8 mg/dL (ref 0.2–1.2)
Total Protein: 6.5 g/dL (ref 6.0–8.3)

## 2014-04-19 LAB — CBC
HCT: 43.6 % (ref 36.0–46.0)
HEMOGLOBIN: 14.3 g/dL (ref 12.0–15.0)
MCH: 28.5 pg (ref 26.0–34.0)
MCHC: 32.8 g/dL (ref 30.0–36.0)
MCV: 87 fL (ref 78.0–100.0)
MPV: 9.9 fL (ref 8.6–12.4)
Platelets: 301 10*3/uL (ref 150–400)
RBC: 5.01 MIL/uL (ref 3.87–5.11)
RDW: 13.5 % (ref 11.5–15.5)
WBC: 7.6 10*3/uL (ref 4.0–10.5)

## 2014-04-20 LAB — T3, FREE: T3, Free: 2.8 pg/mL (ref 2.3–4.2)

## 2014-04-20 LAB — TSH: TSH: 0.023 u[IU]/mL — AB (ref 0.350–4.500)

## 2014-04-20 LAB — VITAMIN D 25 HYDROXY (VIT D DEFICIENCY, FRACTURES): VIT D 25 HYDROXY: 105 ng/mL — AB (ref 30–100)

## 2014-04-20 LAB — T4, FREE: Free T4: 1 ng/dL (ref 0.80–1.80)

## 2014-04-23 ENCOUNTER — Telehealth: Payer: Self-pay | Admitting: Internal Medicine

## 2014-04-23 ENCOUNTER — Encounter: Payer: Self-pay | Admitting: Internal Medicine

## 2014-04-23 NOTE — Telephone Encounter (Signed)
Spoke with pt regarding labs  She had doubled up on her vitamin D and was taking 10,000 units of D when she was sick.  I advised to take only 800 units daily  See TSH  She was given a bovine thyroid supplement by Dr. Sharol Roussel called [facility]   Advised to stop this and have her TSH rechecked in 4-6 months.  She voices understanding  Pt states she will see labs via My Chart

## 2014-05-14 ENCOUNTER — Encounter: Payer: Self-pay | Admitting: Internal Medicine

## 2014-05-14 ENCOUNTER — Ambulatory Visit (INDEPENDENT_AMBULATORY_CARE_PROVIDER_SITE_OTHER): Payer: 59 | Admitting: Internal Medicine

## 2014-05-14 VITALS — BP 141/82 | HR 97 | Resp 16 | Ht 63.5 in | Wt 170.0 lb

## 2014-05-14 DIAGNOSIS — I10 Essential (primary) hypertension: Secondary | ICD-10-CM | POA: Diagnosis not present

## 2014-05-14 DIAGNOSIS — R946 Abnormal results of thyroid function studies: Secondary | ICD-10-CM

## 2014-05-14 DIAGNOSIS — J9801 Acute bronchospasm: Secondary | ICD-10-CM

## 2014-05-14 DIAGNOSIS — R7989 Other specified abnormal findings of blood chemistry: Secondary | ICD-10-CM

## 2014-05-14 MED ORDER — AMLODIPINE BESYLATE 5 MG PO TABS: 5.0000 mg | ORAL_TABLET | Freq: Every day | ORAL | Status: DC

## 2014-05-14 MED ORDER — BUDESONIDE-FORMOTEROL FUMARATE 80-4.5 MCG/ACT IN AERO: 2.0000 | INHALATION_SPRAY | Freq: Two times a day (BID) | RESPIRATORY_TRACT | Status: DC

## 2014-05-14 NOTE — Progress Notes (Signed)
Subjective:    Patient ID: Michelle [name], female    DOB: [birth year], 59 y.o.   MRN: [id number]  HPI  04/16/2014  Assessment & Plan:  HTN I advised pt that there is likely a genetic component to her HTN. If testosterone is in Troches this my be a contributor . Discussed multiple complications of untreated Htn including MI, CVA and renal failure. Will initiate Norvasc 5 mg daily. She has a long h/o using homeopathic remedies. I hope she Will take medication.  I note she did not have her chemistries and thyroid done last year. Will get thyroid studies and all labs today Michelle [name] will call pt   Bronchospasm Since this is spring allergy season Advised to use Dulera bid for 4 weeks then if still well can taper to 2 inhalations once a day  On HT with troches Advised to see Michelle [name] since she has not been there in a while   See me in 4 weeks or prn       TODAY  Michelle [name] is here for follow up of multiple issues    HTN- initiated Norvasc last visit  And also to follow up on bronchospasm   Low TSH she was on Bovine thyroid preparation from Michelle [name] - she has stopped this now Bronchospasm  No rescue inhaler use.  She is using Symbicort bid .      Allergies  Allergen Reactions  . Shrimp [Shellfish Allergy] Itching    Allergic to shrimp.  Wheezing and tightness in chest.  . Tdap [Diphth-Acell Pertussis-Tetanus] Other (See Comments)    Change in level of consciousness  . Influenza Vaccine Live     Sever flu symptoms, chest tightness, wheezing  . Latex Rash  . Vitamin B12     Swelling in extremity and general malize.   Past Medical History  Diagnosis Date  . Asthma   . Hyperlipidemia   . Hypertension   . Thyroid disease   . Obesity    Past Surgical History  Procedure Laterality Date  . Tonsillectomy and adenoidectomy  childhood  . Vaginal hysterectomy    . Tubal ligation     History   Social History  . Marital Status: Married    Spouse Name: N/A  .  Number of Children: N/A  . Years of Education: N/A   Occupational History  . Not on file.   Social History Main Topics  . Smoking status: Never Smoker   . Smokeless tobacco: Never Used  . Alcohol Use: No  . Drug Use: No  . Sexual Activity: Yes   Other Topics Concern  . Not on file   Social History Narrative   Family History  Problem Relation Age of Onset  . Heart disease Mother   . Hyperlipidemia Mother   . Stroke Mother   . Hypertension Mother    Patient Active Problem List   Diagnosis Date Noted  . Renal calculi  incidental on CT imaging 03/14/2014  . Borderline diabetes  DX Dr Sharol [name] 03/11/2014  . HTN (hypertension) 03/26/2013  . Hyperlipidemia 03/26/2013  . Osteopenia 03/26/2013  . Hypothyroidism 03/26/2013  . Menopausal symptoms 02/24/2012  . Status post hysterectomy 02/24/2012   Current Outpatient Prescriptions on File Prior to Visit  Medication Sig Dispense Refill  . albuterol (PROVENTIL HFA;VENTOLIN HFA) 108 (90 BASE) MCG/ACT inhaler INhale 1 puffs into lungs every 8 hours prn wheezing or SOB 1 Inhaler 0  . ALPHA LIPOIC ACID PO Take 1-2 capsules by mouth 2 (  two) times daily.      . AMBULATORY NON FORMULARY MEDICATION Medication Name: Biest/Progesterone/Testosterone/DHEA 2.5/250/0.5/2mg     . amLODipine (NORVASC) 5 MG tablet Take 1 tablet (5 mg total) by mouth daily. 30 tablet 2  . aspirin 81 MG EC tablet Take 81 mg by mouth daily. Swallow whole.     . budesonide-formoterol (SYMBICORT) 80-4.5 MCG/ACT inhaler Inhale 2 puffs into the lungs 2 (two) times daily. 1 Inhaler 1  . Calcium-Magnesium (CAL-MAG PO) Take 1 tablet by mouth daily.    . Cholecalciferol (VITAMIN D) 2000 UNITS tablet Take 2,000 Units by mouth daily.      . metformin (FORTAMET) 500 MG (OSM) 24 hr tablet Take 500 mg by mouth 2 (two) times daily with a meal.     . MULTIPLE VITAMIN PO Take 1 tablet by mouth daily.      . NONFORMULARY OR COMPOUNDED ITEM Take 1 tablet by mouth 2 (two) times daily. GTA  Forte    . Omega 3-6-9 Fatty Acids LIQD Take 10 mLs by mouth daily.      Marland Kitchen PROBIOTIC CAPS Take 1 capsule by mouth daily.      . simvastatin (ZOCOR) 20 MG tablet Take 20 mg by mouth daily.     No current facility-administered medications on file prior to visit.      Review of Systems See HPI    Objective:   Physical Exam  Physical Exam  Nursing note and vitals reviewed.  Constitutional: She is oriented to person, place, and time. She appears well-developed and well-nourished.  HENT:  Head: Normocephalic and atraumatic.  Cardiovascular: Normal rate and regular rhythm. Exam reveals no gallop and no friction rub.  No murmur heard.  Pulmonary/Chest: Breath sounds normal. She has no wheezes. She has no rales.  Neurological: She is alert and oriented to person, place, and time.  Skin: Skin is warm and dry.  Psychiatric: She has a normal mood and affect. Her behavior is normal.       Assessment & Plan:  HTN  Continue Norvasc  Low TSH  She is off Bovine preparation  Will check TSH in 4-6 weeks    Bronchospasm:    Continue Symbicort

## 2014-06-21 LAB — TSH: TSH: 3.18 u[IU]/mL (ref 0.350–4.500)

## 2014-06-24 ENCOUNTER — Telehealth: Payer: Self-pay | Admitting: *Deleted

## 2014-06-24 NOTE — Telephone Encounter (Signed)
Unable to reach patient at time of Pre-Visit Call.  Left message for patient to return call when available.    

## 2014-06-25 ENCOUNTER — Ambulatory Visit (INDEPENDENT_AMBULATORY_CARE_PROVIDER_SITE_OTHER): Payer: 59 | Admitting: Family Medicine

## 2014-06-25 ENCOUNTER — Telehealth: Payer: Self-pay | Admitting: *Deleted

## 2014-06-25 ENCOUNTER — Encounter: Payer: Self-pay | Admitting: Family Medicine

## 2014-06-25 VITALS — BP 122/74 | HR 76 | Temp 98.8°F | Resp 16 | Ht 63.0 in | Wt 175.0 lb

## 2014-06-25 DIAGNOSIS — E039 Hypothyroidism, unspecified: Secondary | ICD-10-CM

## 2014-06-25 DIAGNOSIS — R7303 Prediabetes: Secondary | ICD-10-CM

## 2014-06-25 DIAGNOSIS — R7309 Other abnormal glucose: Secondary | ICD-10-CM | POA: Diagnosis not present

## 2014-06-25 DIAGNOSIS — I10 Essential (primary) hypertension: Secondary | ICD-10-CM | POA: Diagnosis not present

## 2014-06-25 MED ORDER — LISINOPRIL 10 MG PO TABS: 10.0000 mg | ORAL_TABLET | Freq: Every day | ORAL | Status: DC

## 2014-06-25 NOTE — Telephone Encounter (Signed)
-----   Message from Lanice Shirts, MD sent at 06/23/2014  5:48 PM EDT ----- Call Jana Half and let her know her TSH is now normal.  Stay on current dosing of thyroid med  Ok to mail to her

## 2014-06-25 NOTE — Patient Instructions (Signed)

## 2014-06-25 NOTE — Progress Notes (Signed)
Pre visit review using our clinic review tool, if applicable. No additional management support is needed unless otherwise documented below in the visit note. 

## 2014-06-25 NOTE — Telephone Encounter (Signed)
Patient aware of her TSH results-eh

## 2014-06-26 NOTE — Assessment & Plan Note (Signed)
On metformin--- she was kept on it even though a1c is low secondary to possible weight loss benefits

## 2014-06-26 NOTE — Progress Notes (Signed)
Patient ID: AUSTRALIA DROLL, female    DOB: 01-03-1956  Age: 59 y.o. MRN: [id number]    Subjective:  Subjective HPI Michelle [name] presents to establish care.  She feels the norvasc is causing weight gain and swelling.  No other complaints.   Review of Systems  Constitutional: Negative for diaphoresis, appetite change, fatigue and unexpected weight change.  Eyes: Negative for pain, redness and visual disturbance.  Respiratory: Negative for cough, chest tightness, shortness of breath and wheezing.   Cardiovascular: Negative for chest pain, palpitations and leg swelling.  Endocrine: Negative for cold intolerance, heat intolerance, polydipsia, polyphagia and polyuria.  Genitourinary: Negative for dysuria, frequency and difficulty urinating.  Neurological: Negative for dizziness, light-headedness, numbness and headaches.  Psychiatric/Behavioral: Negative for decreased concentration. The patient is not nervous/anxious.     History Past Medical History  Diagnosis Date  . Asthma   . Hyperlipidemia   . Hypertension   . Thyroid disease   . Obesity   . Diabetes mellitus without complication   . Bronchitis   . Allergy     She has past surgical history that includes Tonsillectomy and adenoidectomy (childhood); Tubal ligation; and Vaginal hysterectomy (2011).   Her family history includes Heart disease in her mother; Hyperlipidemia in her mother; Hypertension in her mother; Stroke in her mother.She reports that she has never smoked. She has never used smokeless tobacco. She reports that she does not drink alcohol or use illicit drugs.  Current Outpatient Prescriptions on File Prior to Visit  Medication Sig Dispense Refill  . albuterol (PROVENTIL HFA;VENTOLIN HFA) 108 (90 BASE) MCG/ACT inhaler INhale 1 puffs into lungs every 8 hours prn wheezing or SOB 1 Inhaler 0  . ALPHA LIPOIC ACID PO Take 1-2 capsules by mouth 2 (two) times daily.      . AMBULATORY NON FORMULARY MEDICATION Medication  Name: Biest/Progesterone/Testosterone/DHEA 2.5/250/0.5/2mg     . aspirin 81 MG EC tablet Take 81 mg by mouth daily. Swallow whole.     . budesonide-formoterol (SYMBICORT) 80-4.5 MCG/ACT inhaler Inhale 2 puffs into the lungs 2 (two) times daily. 1 Inhaler 1  . Calcium-Magnesium (CAL-MAG PO) Take 1 tablet by mouth daily.    . Cholecalciferol (VITAMIN D) 2000 UNITS tablet Take 2,000 Units by mouth daily.      . metformin (FORTAMET) 500 MG (OSM) 24 hr tablet Take 500 mg by mouth 2 (two) times daily with a meal.     . MULTIPLE VITAMIN PO Take 1 tablet by mouth daily.      . Omega 3-6-9 Fatty Acids LIQD Take 10 mLs by mouth daily.      Marland Kitchen PROBIOTIC CAPS Take 1 capsule by mouth daily.      . simvastatin (ZOCOR) 20 MG tablet Take 20 mg by mouth daily.     No current facility-administered medications on file prior to visit.     Objective:  Objective Physical Exam  Constitutional: She is oriented to person, place, and time. She appears well-developed and well-nourished.  HENT:  Head: Normocephalic and atraumatic.  Eyes: Conjunctivae and EOM are normal.  Neck: Normal range of motion. Neck supple. No JVD present. Carotid bruit is not present. No thyromegaly present.  Cardiovascular: Normal rate, regular rhythm and normal heart sounds.   No murmur heard. Pulmonary/Chest: Effort normal and breath sounds normal. No respiratory distress. She has no wheezes. She has no rales. She exhibits no tenderness.  Musculoskeletal: She exhibits no edema.  Neurological: She is alert and oriented to person, place, and time.  Psychiatric: She has a normal mood and affect. Thought content normal.   BP 122/74 mmHg  Pulse 76  Temp(Src) 98.8 F (37.1 C) (Oral)  Resp 16  Ht 5\' 3"  (1.6 m)  Wt 175 lb (79.379 kg)  BMI 31.01 kg/m2  SpO2 98% Wt Readings from Last 3 Encounters:  06/25/14 175 lb (79.379 kg)  05/14/14 170 lb (77.111 kg)  04/16/14 171 lb (77.565 kg)     Lab Results  Component Value Date   WBC 7.6  04/19/2014   HGB 14.3 04/19/2014   HCT 43.6 04/19/2014   PLT 301 04/19/2014   GLUCOSE 89 04/19/2014   CHOL 147 04/19/2014   TRIG 65 04/19/2014   HDL 61 04/19/2014   LDLCALC 73 04/19/2014   ALT 24 04/19/2014   AST 18 04/19/2014   NA 139 04/19/2014   K 4.5 04/19/2014   CL 103 04/19/2014   CREATININE 0.59 04/19/2014   BUN 15 04/19/2014   CO2 25 04/19/2014   TSH 3.180 06/20/2014    Mm Screening Breast Tomo Bilateral  03/27/2014   CLINICAL DATA:  Screening.  EXAM: DIGITAL SCREENING BILATERAL MAMMOGRAM WITH 3D TOMO WITH CAD  COMPARISON:  Previous exam(s).  ACR Breast Density Category b: There are scattered areas of fibroglandular density.  FINDINGS: There are no findings suspicious for malignancy. Images were processed with CAD.  IMPRESSION: No mammographic evidence of malignancy. A result letter of this screening mammogram will be mailed directly to the patient.  RECOMMENDATION: Screening mammogram in one year. (Code:SM-B-01Y)  BI-RADS CATEGORY  1: Negative.   Electronically Signed   By: Lillia Mountain M.D.   On: 03/27/2014 14:05     Assessment & Plan:  Plan I have discontinued [name]'s amLODipine. I am also having her start on lisinopril. Additionally, I am having her maintain her aspirin, MULTIPLE VITAMIN PO, metformin, Vitamin D, Probiotic, Omega 3-6-9 Fatty Acids, ALPHA LIPOIC ACID PO, AMBULATORY NON FORMULARY MEDICATION, Calcium-Magnesium (CAL-MAG PO), simvastatin, albuterol, and budesonide-formoterol.  Meds ordered this encounter  Medications  . lisinopril (PRINIVIL,ZESTRIL) 10 MG tablet    Sig: Take 1 tablet (10 mg total) by mouth daily.    Dispense:  90 tablet    Refill:  3    Problem List Items Addressed This Visit    Hypothyroidism    Lab Results  Component Value Date   TSH 3.180 06/20/2014   con't synthroid      HTN (hypertension) - Primary    Stable, d/c norvasc Start lisinopril  rto 2-3 weeks      Relevant Medications   lisinopril (PRINIVIL,ZESTRIL) 10  MG tablet   Borderline diabetes  DX Dr Sharol Roussel    On metformin--- she was kept on it even though a1c is low secondary to possible weight loss benefits         Follow-up: Return in about 3 weeks (around 07/16/2014), or if symptoms worsen or fail to improve.  Garnet Koyanagi, DO

## 2014-06-26 NOTE — Assessment & Plan Note (Signed)
Lab Results  Component Value Date   TSH 3.180 06/20/2014   con't synthroid

## 2014-06-26 NOTE — Assessment & Plan Note (Signed)
Stable, d/c norvasc Start lisinopril  rto 2-3 weeks

## 2014-06-28 ENCOUNTER — Other Ambulatory Visit: Payer: Self-pay | Admitting: Family Medicine

## 2014-06-28 ENCOUNTER — Other Ambulatory Visit: Payer: Self-pay

## 2014-06-28 DIAGNOSIS — R7303 Prediabetes: Secondary | ICD-10-CM

## 2014-06-28 DIAGNOSIS — E785 Hyperlipidemia, unspecified: Secondary | ICD-10-CM

## 2014-06-28 MED ORDER — SIMVASTATIN 20 MG PO TABS: 20.0000 mg | ORAL_TABLET | Freq: Every day | ORAL | Status: DC

## 2014-06-28 MED ORDER — METFORMIN HCL ER 500 MG PO TB24: 500.0000 mg | ORAL_TABLET | Freq: Two times a day (BID) | ORAL | Status: DC

## 2014-06-28 NOTE — Telephone Encounter (Signed)
The patient Est on 06/25/14. Please advise if refills appropriate, No DM labs.      KP

## 2014-07-19 ENCOUNTER — Ambulatory Visit (INDEPENDENT_AMBULATORY_CARE_PROVIDER_SITE_OTHER): Payer: 59 | Admitting: Family Medicine

## 2014-07-19 ENCOUNTER — Encounter: Payer: Self-pay | Admitting: Family Medicine

## 2014-07-19 VITALS — BP 130/84 | HR 89 | Temp 99.2°F | Ht 63.0 in

## 2014-07-19 DIAGNOSIS — E785 Hyperlipidemia, unspecified: Secondary | ICD-10-CM

## 2014-07-19 DIAGNOSIS — E039 Hypothyroidism, unspecified: Secondary | ICD-10-CM | POA: Diagnosis not present

## 2014-07-19 DIAGNOSIS — I10 Essential (primary) hypertension: Secondary | ICD-10-CM | POA: Diagnosis not present

## 2014-07-19 NOTE — Patient Instructions (Signed)
Hypothyroidism The thyroid is a large gland located in the lower front of your neck. The thyroid gland helps control metabolism. Metabolism is how your body handles food. It controls metabolism with the hormone thyroxine. When this gland is underactive (hypothyroid), it produces too little hormone.  CAUSES These include:   Absence or destruction of thyroid tissue.  Goiter due to iodine deficiency.  Goiter due to medications.  Congenital defects (since birth).  Problems with the pituitary. This causes a lack of TSH (thyroid stimulating hormone). This hormone tells the thyroid to turn out more hormone. SYMPTOMS  Lethargy (feeling as though you have no energy)  Cold intolerance  Weight gain (in spite of normal food intake)  Dry skin  Coarse hair  Menstrual irregularity (if severe, may lead to infertility)  Slowing of thought processes Cardiac problems are also caused by insufficient amounts of thyroid hormone. Hypothyroidism in the newborn is cretinism, and is an extreme form. It is important that this form be treated adequately and immediately or it will lead rapidly to retarded physical and mental development. DIAGNOSIS  To prove hypothyroidism, your caregiver may do blood tests and ultrasound tests. Sometimes the signs are hidden. It may be necessary for your caregiver to watch this illness with blood tests either before or after diagnosis and treatment. TREATMENT  Low levels of thyroid hormone are increased by using synthetic thyroid hormone. This is a safe, effective treatment. It usually takes about four weeks to gain the full effects of the medication. After you have the full effect of the medication, it will generally take another four weeks for problems to leave. Your caregiver may start you on low doses. If you have had heart problems the dose may be gradually increased. It is generally not an emergency to get rapidly to normal. HOME CARE INSTRUCTIONS   Take your  medications as your caregiver suggests. Let your caregiver know of any medications you are taking or start taking. Your caregiver will help you with dosage schedules.  As your condition improves, your dosage needs may increase. It will be necessary to have continuing blood tests as suggested by your caregiver.  Report all suspected medication side effects to your caregiver. SEEK MEDICAL CARE IF: Seek medical care if you develop:  Sweating.  Tremulousness (tremors).  Anxiety.  Rapid weight loss.  Heat intolerance.  Emotional swings.  Diarrhea.  Weakness. SEEK IMMEDIATE MEDICAL CARE IF:  You develop chest pain, an irregular heart beat (palpitations), or a rapid heart beat. MAKE SURE YOU:   Understand these instructions.  Will watch your condition.  Will get help right away if you are not doing well or get worse. Document Released: 02/01/2005 Document Revised: 04/26/2011 Document Reviewed: 09/22/2007 Exit[employer] Patient Information 2015 [employer]. This information is not intended to replace advice given to you by your health care provider. Make sure you discuss any questions you have with your health care provider.  

## 2014-07-19 NOTE — Progress Notes (Signed)
Patient ID: Michelle [name], female    DOB: 1955-04-16  Age: 59 y.o. MRN: [id number]    Subjective:  Subjective HPI Michelle [name] presents for bp f/u.    Review of Systems  Constitutional: Negative for diaphoresis, appetite change, fatigue and unexpected weight change.  Eyes: Negative for pain, redness and visual disturbance.  Respiratory: Negative for cough, chest tightness, shortness of breath and wheezing.   Cardiovascular: Negative for chest pain, palpitations and leg swelling.  Endocrine: Negative for cold intolerance, heat intolerance, polydipsia, polyphagia and polyuria.  Genitourinary: Negative for dysuria, frequency and difficulty urinating.  Neurological: Negative for dizziness, light-headedness, numbness and headaches.    History Past Medical History  Diagnosis Date  . Asthma   . Hyperlipidemia   . Hypertension   . Thyroid disease   . Obesity   . Diabetes mellitus without complication   . Bronchitis   . Allergy     She has past surgical history that includes Tonsillectomy and adenoidectomy (childhood); Tubal ligation; and Vaginal hysterectomy (2011).   Her family history includes Heart disease in her mother; Hyperlipidemia in her mother; Hypertension in her mother; Stroke in her mother.She reports that she has never smoked. She has never used smokeless tobacco. She reports that she does not drink alcohol or use illicit drugs.  Current Outpatient Prescriptions on File Prior to Visit  Medication Sig Dispense Refill  . albuterol (PROVENTIL HFA;VENTOLIN HFA) 108 (90 BASE) MCG/ACT inhaler INhale 1 puffs into lungs every 8 hours prn wheezing or SOB 1 Inhaler 0  . ALPHA LIPOIC ACID PO Take 1-2 capsules by mouth 2 (two) times daily.      . AMBULATORY NON FORMULARY MEDICATION Medication Name: Biest/Progesterone/Testosterone/DHEA 2.5/250/0.5/2mg     . aspirin 81 MG EC tablet Take 81 mg by mouth daily. Swallow whole.     . budesonide-formoterol (SYMBICORT) 80-4.5 MCG/ACT  inhaler Inhale 2 puffs into the lungs 2 (two) times daily. (Patient taking differently: Inhale 1 puff into the lungs daily. ) 1 Inhaler 1  . Calcium-Magnesium (CAL-MAG PO) Take 1 tablet by mouth daily.    . metFORMIN (GLUCOPHAGE-XR) 500 MG 24 hr tablet Take 1 tablet (500 mg total) by mouth 2 (two) times daily. 180 tablet 1  . MULTIPLE VITAMIN PO Take 1 tablet by mouth daily.      . Omega 3-6-9 Fatty Acids LIQD Take 10 mLs by mouth daily.      Marland Kitchen PROBIOTIC CAPS Take 1 capsule by mouth daily.      . simvastatin (ZOCOR) 20 MG tablet Take 1 tablet (20 mg total) by mouth daily. 90 tablet 1   No current facility-administered medications on file prior to visit.     Objective:  Objective Physical Exam  Constitutional: She is oriented to person, place, and time. She appears well-developed and well-nourished.  HENT:  Head: Normocephalic and atraumatic.  Eyes: Conjunctivae and EOM are normal.  Neck: Normal range of motion. Neck supple. No JVD present. Carotid bruit is not present. No thyromegaly present.  Cardiovascular: Normal rate, regular rhythm and normal heart sounds.   No murmur heard. Pulmonary/Chest: Effort normal and breath sounds normal. No respiratory distress. She has no wheezes. She has no rales. She exhibits no tenderness.  Musculoskeletal: She exhibits no edema.  Neurological: She is alert and oriented to person, place, and time.  Psychiatric: She has a normal mood and affect. Her behavior is normal.   BP 130/84 mmHg  Pulse 89  Temp(Src) 99.2 F (37.3 C) (Oral)  Ht  5\' 3"  (1.6 m)  Wt   SpO2 97% Wt Readings from Last 3 Encounters:  06/25/14 175 lb (79.379 kg)  05/14/14 170 lb (77.111 kg)  04/16/14 171 lb (77.565 kg)     Lab Results  Component Value Date   WBC 7.6 04/19/2014   HGB 14.3 04/19/2014   HCT 43.6 04/19/2014   PLT 301 04/19/2014   GLUCOSE 89 04/19/2014   CHOL 147 04/19/2014   TRIG 65 04/19/2014   HDL 61 04/19/2014   LDLCALC 73 04/19/2014   ALT 24  04/19/2014   AST 18 04/19/2014   NA 139 04/19/2014   K 4.5 04/19/2014   CL 103 04/19/2014   CREATININE 0.59 04/19/2014   BUN 15 04/19/2014   CO2 25 04/19/2014   TSH 3.180 06/20/2014    Mm Screening Breast Tomo Bilateral  03/27/2014   CLINICAL DATA:  Screening.  EXAM: DIGITAL SCREENING BILATERAL MAMMOGRAM WITH 3D TOMO WITH CAD  COMPARISON:  Previous exam(s).  ACR Breast Density Category b: There are scattered areas of fibroglandular density.  FINDINGS: There are no findings suspicious for malignancy. Images were processed with CAD.  IMPRESSION: No mammographic evidence of malignancy. A result letter of this screening mammogram will be mailed directly to the patient.  RECOMMENDATION: Screening mammogram in one year. (Code:SM-B-01Y)  BI-RADS CATEGORY  1: Negative.   Electronically Signed   By: Lillia Mountain M.D.   On: 03/27/2014 14:05     Assessment & Plan:  Plan I have discontinued Michelle [name]'s Vitamin D and lisinopril. I am also having her maintain her aspirin, MULTIPLE VITAMIN PO, Probiotic, Omega 3-6-9 Fatty Acids, ALPHA LIPOIC ACID PO, AMBULATORY NON FORMULARY MEDICATION, Calcium-Magnesium (CAL-MAG PO), albuterol, budesonide-formoterol, metFORMIN, simvastatin, and Vitamin D3.  Meds ordered this encounter  Medications  . Cholecalciferol (VITAMIN D3) 5000 UNITS CAPS    Sig: Take 1 capsule by mouth daily.    Problem List Items Addressed This Visit    Hyperlipidemia    Lab Results  Component Value Date   CHOL 147 04/19/2014   HDL 61 04/19/2014   LDLCALC 73 04/19/2014   TRIG 65 04/19/2014   CHOLHDL 2.4 04/19/2014   Recheck 6 months      HTN (hypertension)    Stable off bp meds Pt states bp dropped way too low with med--- slightly elevated today secondary to stress       Other Visit Diagnoses    Hypothyroidism, unspecified hypothyroidism type    -  Primary       Follow-up: Return in about 6 months (around 01/18/2015), or if symptoms worsen or fail to improve, for  hyperlipidemia.  Michelle Koyanagi, DO

## 2014-07-19 NOTE — Progress Notes (Signed)
Pre visit review using our clinic review tool, if applicable. No additional management support is needed unless otherwise documented below in the visit note. 

## 2014-07-19 NOTE — Assessment & Plan Note (Signed)
Stable off bp meds Pt states bp dropped way too low with med--- slightly elevated today secondary to stress

## 2014-07-19 NOTE — Assessment & Plan Note (Signed)
Lab Results  Component Value Date   CHOL 147 04/19/2014   HDL 61 04/19/2014   LDLCALC 73 04/19/2014   TRIG 65 04/19/2014   CHOLHDL 2.4 04/19/2014   Recheck 6 months

## 2014-09-25 ENCOUNTER — Telehealth: Payer: Self-pay | Admitting: Family Medicine

## 2014-09-25 MED ORDER — BUDESONIDE-FORMOTEROL FUMARATE 80-4.5 MCG/ACT IN AERO: 1.0000 | INHALATION_SPRAY | Freq: Every day | RESPIRATORY_TRACT | Status: DC

## 2014-09-25 NOTE — Telephone Encounter (Signed)
Rx has been faxed. Is she wanting a call back to discuss breathing?

## 2014-09-25 NOTE — Telephone Encounter (Signed)
Pt needing refill on symbicort. She also scheduled appt to discuss possible referral to pulmonary as her breathing issues do not seem to be improving. Appt 8/16 11:15am.

## 2014-09-26 NOTE — Telephone Encounter (Signed)
No call needed

## 2014-10-01 ENCOUNTER — Ambulatory Visit (INDEPENDENT_AMBULATORY_CARE_PROVIDER_SITE_OTHER): Payer: 59 | Admitting: Family Medicine

## 2014-10-01 ENCOUNTER — Encounter: Payer: Self-pay | Admitting: Family Medicine

## 2014-10-01 VITALS — BP 140/84 | HR 92 | Temp 99.2°F | Wt 184.0 lb

## 2014-10-01 DIAGNOSIS — E039 Hypothyroidism, unspecified: Secondary | ICD-10-CM

## 2014-10-01 DIAGNOSIS — Z8709 Personal history of other diseases of the respiratory system: Secondary | ICD-10-CM

## 2014-10-01 DIAGNOSIS — R5383 Other fatigue: Secondary | ICD-10-CM | POA: Diagnosis not present

## 2014-10-01 LAB — CBC WITH DIFFERENTIAL/PLATELET
BASOS ABS: 0 10*3/uL (ref 0.0–0.1)
Basophils Relative: 0.5 % (ref 0.0–3.0)
EOS PCT: 7.9 % — AB (ref 0.0–5.0)
Eosinophils Absolute: 0.7 10*3/uL (ref 0.0–0.7)
HEMATOCRIT: 41.3 % (ref 36.0–46.0)
Hemoglobin: 13.7 g/dL (ref 12.0–15.0)
LYMPHS PCT: 42.9 % (ref 12.0–46.0)
Lymphs Abs: 3.6 10*3/uL (ref 0.7–4.0)
MCHC: 33.2 g/dL (ref 30.0–36.0)
MCV: 87.7 fl (ref 78.0–100.0)
MONOS PCT: 9 % (ref 3.0–12.0)
Monocytes Absolute: 0.8 10*3/uL (ref 0.1–1.0)
Neutro Abs: 3.4 10*3/uL (ref 1.4–7.7)
Neutrophils Relative %: 39.7 % — ABNORMAL LOW (ref 43.0–77.0)
Platelets: 250 10*3/uL (ref 150.0–400.0)
RBC: 4.71 Mil/uL (ref 3.87–5.11)
RDW: 12.5 % (ref 11.5–15.5)
WBC: 8.5 10*3/uL (ref 4.0–10.5)

## 2014-10-01 LAB — COMPREHENSIVE METABOLIC PANEL
ALBUMIN: 4.3 g/dL (ref 3.5–5.2)
ALK PHOS: 35 U/L — AB (ref 39–117)
ALT: 22 U/L (ref 0–35)
AST: 16 U/L (ref 0–37)
BILIRUBIN TOTAL: 0.6 mg/dL (ref 0.2–1.2)
BUN: 15 mg/dL (ref 6–23)
CO2: 29 mEq/L (ref 19–32)
Calcium: 9.5 mg/dL (ref 8.4–10.5)
Chloride: 104 mEq/L (ref 96–112)
Creatinine, Ser: 0.68 mg/dL (ref 0.40–1.20)
GFR: 93.32 mL/min (ref >60.00)
GLUCOSE: 105 mg/dL — AB (ref 70–99)
Potassium: 4.1 mEq/L (ref 3.5–5.1)
SODIUM: 139 meq/L (ref 135–145)
TOTAL PROTEIN: 6.9 g/dL (ref 6.0–8.3)

## 2014-10-01 LAB — THYROID PANEL WITH TSH
FREE THYROXINE INDEX: 1.6 (ref 1.4–3.8)
T3 Uptake: 27 % (ref 22–35)
T4, Total: 5.8 ug/dL (ref 4.5–12.0)
TSH: 2.703 u[IU]/mL (ref 0.350–4.500)

## 2014-10-01 MED ORDER — BUDESONIDE-FORMOTEROL FUMARATE 80-4.5 MCG/ACT IN AERO: INHALATION_SPRAY | RESPIRATORY_TRACT | Status: DC

## 2014-10-01 NOTE — Progress Notes (Signed)
Patient ID: Michelle [name], female    DOB: [birth year]  Age: 59 y.o. MRN: [id number]    Subjective:  Subjective HPI Michelle [name] presents for f/u thyroid.  She was taken of armour thyroid in Feb.  Since then she has c/o constipation, hair loss, weight gain and fatigue.    Review of Systems  Constitutional: Positive for fatigue. Negative for diaphoresis, appetite change and unexpected weight change.  Eyes: Negative for pain, redness and visual disturbance.  Respiratory: Negative for cough, chest tightness, shortness of breath and wheezing.   Cardiovascular: Negative for chest pain, palpitations and leg swelling.  Endocrine: Negative for cold intolerance, heat intolerance, polydipsia, polyphagia and polyuria.  Genitourinary: Negative for dysuria, frequency and difficulty urinating.  Neurological: Negative for dizziness, light-headedness, numbness and headaches.    History Past Medical History  Diagnosis Date  . Asthma   . Hyperlipidemia   . Hypertension   . Thyroid disease   . Obesity   . Diabetes mellitus without complication   . Bronchitis   . Allergy     She has past surgical history that includes Tonsillectomy and adenoidectomy (childhood); Tubal ligation; and Vaginal hysterectomy (2011).   Her family history includes Heart disease in her mother; Hyperlipidemia in her mother; Hypertension in her mother; Stroke in her mother.She reports that she has never smoked. She has never used smokeless tobacco. She reports that she does not drink alcohol or use illicit drugs.  Current Outpatient Prescriptions on File Prior to Visit  Medication Sig Dispense Refill  . albuterol (PROVENTIL HFA;VENTOLIN HFA) 108 (90 BASE) MCG/ACT inhaler INhale 1 puffs into lungs every 8 hours prn wheezing or SOB 1 Inhaler 0  . ALPHA LIPOIC ACID PO Take 1-2 capsules by mouth 2 (two) times daily.      . AMBULATORY NON FORMULARY MEDICATION Medication Name: Biest/Progesterone/Testosterone/DHEA  2.5/250/0.5/2mg     . aspirin 81 MG EC tablet Take 81 mg by mouth daily. Swallow whole.     . Calcium-Magnesium (CAL-MAG PO) Take 1 tablet by mouth daily.    . Cholecalciferol (VITAMIN D3) 5000 UNITS CAPS Take 1 capsule by mouth daily.    . metFORMIN (GLUCOPHAGE-XR) 500 MG 24 hr tablet Take 1 tablet (500 mg total) by mouth 2 (two) times daily. 180 tablet 1  . MULTIPLE VITAMIN PO Take 1 tablet by mouth daily.      . Omega 3-6-9 Fatty Acids LIQD Take 10 mLs by mouth daily.      Marland Kitchen PROBIOTIC CAPS Take 1 capsule by mouth daily.      . simvastatin (ZOCOR) 20 MG tablet Take 1 tablet (20 mg total) by mouth daily. 90 tablet 1   No current facility-administered medications on file prior to visit.     Objective:  Objective Physical Exam  Constitutional: She is oriented to person, place, and time. She appears well-developed and well-nourished.  HENT:  Head: Normocephalic and atraumatic.  Eyes: Conjunctivae and EOM are normal.  Neck: Normal range of motion. Neck supple. No JVD present. Carotid bruit is not present. No thyromegaly present.  Cardiovascular: Normal rate, regular rhythm and normal heart sounds.   No murmur heard. Pulmonary/Chest: Effort normal. No respiratory distress. She has wheezes. She has no rales. She exhibits no tenderness.  Musculoskeletal: She exhibits no edema.  Neurological: She is alert and oriented to person, place, and time.  Psychiatric: She has a normal mood and affect. Her behavior is normal.  Nursing note and vitals reviewed.  BP 140/84 mmHg  Pulse 92  Temp(Src) 99.2 F (37.3 C) (Oral)  Wt 184 lb (83.462 kg)  SpO2 97% Wt Readings from Last 3 Encounters:  10/01/14 184 lb (83.462 kg)  06/25/14 175 lb (79.379 kg)  05/14/14 170 lb (77.111 kg)     Lab Results  Component Value Date   WBC 7.6 04/19/2014   HGB 14.3 04/19/2014   HCT 43.6 04/19/2014   PLT 301 04/19/2014   GLUCOSE 89 04/19/2014   CHOL 147 04/19/2014   TRIG 65 04/19/2014   HDL 61 04/19/2014    LDLCALC 73 04/19/2014   ALT 24 04/19/2014   AST 18 04/19/2014   NA 139 04/19/2014   K 4.5 04/19/2014   CL 103 04/19/2014   CREATININE 0.59 04/19/2014   BUN 15 04/19/2014   CO2 25 04/19/2014   TSH 3.180 06/20/2014    Mm Screening Breast Tomo Bilateral  03/27/2014   CLINICAL DATA:  Screening.  EXAM: DIGITAL SCREENING BILATERAL MAMMOGRAM WITH 3D TOMO WITH CAD  COMPARISON:  Previous exam(s).  ACR Breast Density Category b: There are scattered areas of fibroglandular density.  FINDINGS: There are no findings suspicious for malignancy. Images were processed with CAD.  IMPRESSION: No mammographic evidence of malignancy. A result letter of this screening mammogram will be mailed directly to the patient.  RECOMMENDATION: Screening mammogram in one year. (Code:SM-B-01Y)  BI-RADS CATEGORY  1: Negative.   Electronically Signed   By: Lillia Mountain M.D.   On: 03/27/2014 14:05     Assessment & Plan:  Plan I have changed Michelle [name]'s budesonide-formoterol. I am also having her maintain her aspirin, MULTIPLE VITAMIN PO, Probiotic, Omega 3-6-9 Fatty Acids, ALPHA LIPOIC ACID PO, AMBULATORY NON FORMULARY MEDICATION, Calcium-Magnesium (CAL-MAG PO), albuterol, metFORMIN, simvastatin, and Vitamin D3.  Meds ordered this encounter  Medications  . budesonide-formoterol (SYMBICORT) 80-4.5 MCG/ACT inhaler    Sig: 2 puffs bid    Dispense:  1 Inhaler    Refill:  5    Problem List Items Addressed This Visit    None    Visit Diagnoses    Hx of acute bronchitis with bronchospasm    -  Primary    Relevant Medications    budesonide-formoterol (SYMBICORT) 80-4.5 MCG/ACT inhaler    Other Relevant Orders    Ambulatory referral to Pulmonology    Hypothyroidism, unspecified hypothyroidism type        Relevant Orders    Thyroid Panel With TSH    Other fatigue        Relevant Orders    CBC with Differential/Platelet    Comprehensive metabolic panel       Follow-up: No Follow-up on file.  Garnet Koyanagi, DO

## 2014-10-01 NOTE — Patient Instructions (Signed)

## 2014-10-01 NOTE — Progress Notes (Signed)
Pre visit review using our clinic review tool, if applicable. No additional management support is needed unless otherwise documented below in the visit note. 

## 2014-10-15 ENCOUNTER — Ambulatory Visit (INDEPENDENT_AMBULATORY_CARE_PROVIDER_SITE_OTHER)
Admission: RE | Admit: 2014-10-15 | Discharge: 2014-10-15 | Disposition: A | Discharge status: Home / Self Care | Payer: 59 | Source: Ambulatory Visit | Attending: Internal Medicine | Admitting: Internal Medicine

## 2014-10-15 ENCOUNTER — Ambulatory Visit (INDEPENDENT_AMBULATORY_CARE_PROVIDER_SITE_OTHER): Payer: 59 | Admitting: Internal Medicine

## 2014-10-15 ENCOUNTER — Telehealth: Payer: Self-pay | Admitting: Internal Medicine

## 2014-10-15 ENCOUNTER — Encounter: Payer: Self-pay | Admitting: Internal Medicine

## 2014-10-15 VITALS — BP 160/88 | HR 94 | Ht 63.5 in | Wt 185.0 lb

## 2014-10-15 DIAGNOSIS — E669 Obesity, unspecified: Secondary | ICD-10-CM | POA: Diagnosis not present

## 2014-10-15 DIAGNOSIS — R0600 Dyspnea, unspecified: Secondary | ICD-10-CM

## 2014-10-15 DIAGNOSIS — R05 Cough: Secondary | ICD-10-CM

## 2014-10-15 DIAGNOSIS — R058 Other specified cough: Secondary | ICD-10-CM

## 2014-10-15 MED ORDER — IOHEXOL 350 MG/ML SOLN
80.0000 mL | Freq: Once | INTRAVENOUS | Status: AC | PRN
  Administered 2014-10-15: 80 mL via INTRAVENOUS

## 2014-10-15 MED ORDER — PANTOPRAZOLE SODIUM 40 MG PO TBEC: 40.0000 mg | DELAYED_RELEASE_TABLET | Freq: Every day | ORAL | Status: DC

## 2014-10-15 MED ORDER — FAMOTIDINE 20 MG PO TABS: ORAL_TABLET | ORAL | Status: DC

## 2014-10-15 NOTE — Progress Notes (Signed)
Quick Note:  LMTCB ______ 

## 2014-10-15 NOTE — Patient Instructions (Addendum)
Pantoprazole (protonix) 40 mg   Take  30-60 min before first meal of the day and Pepcid (famotidine)  20 mg one @  bedtime until return to office - this is the best way to tell whether stomach acid is contributing to your problem.    GERD (REFLUX)  is an extremely common cause of respiratory symptoms just like yours , many times with no obvious heartburn at all.    It can be treated with medication, but also with lifestyle changes including elevation of the head of your bed (ideally with 6 inch  bed blocks),  Smoking cessation, avoidance of late meals, excessive alcohol, and avoid fatty foods, chocolate, peppermint, colas, red wine, and acidic juices such as orange juice.  NO MINT OR MENTHOL PRODUCTS SO NO COUGH DROPS  USE SUGARLESS CANDY INSTEAD (Jolley ranchers or Stover's or Life Savers) or even ice chips will also do - the key is to swallow to prevent all throat clearing. NO OIL BASED VITAMINS - use powdered substitutes.   Continue symbicort 80 Take 2 puffs first thing in am and then another 2 puffs about 12 hours later.   Only use your albuterol as a rescue medication to be used if you can't catch your breath by resting or doing a relaxed purse lip breathing pattern.  - The less you use it, the better it will work when you need it. - Ok to use up to 2 puffs  every 4 hours if you must but call for immediate appointment if use goes up over your usual need - Don't leave home without it !!  (think of it like the spare tire for your car)   Please see patient coordinator before you leave today  to schedule CTangiogram and sinus ct done   Please schedule a follow up office visit in 4 weeks, sooner if needed - it needs to be first thing in am with pfts

## 2014-10-15 NOTE — Progress Notes (Signed)
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 Asthma required meds for a month or so then none and back to 100% fxn then  acute onset mid Jan 2016 severe cough > ER > 50% improved on symbicort 80 2 bid and qid albuterol > referred by Dr Etter Sjogren to pulmonary clinic 10/15/2014 with persistent daily symptoms of coughing and chest burning.   10/15/2014 f/u ov/Wert re:  Chief Complaint  Patient presents with  . Pulmonary Consult    Referred by Dr Etter Sjogren. Pt c/o cough and SOB since Jan 2016. Pt states that she is SOB "all the time".  She c/o "burning in chest".   ex tolerance = 70 min brisk walk at baseline, now only 45 m L chest discomfort some better since Jan 2016 worse lying down located laterally and posteriorly on the left and recently posteriorly on the right as well  All she describes her cough is productive mostly of her coughing is dry with no history of any hemoptysis  No obvious other patterns in day to day or daytime variabilty or assoc  chest tightness, subjective wheeze overt sinus or hb symptoms. No unusual exp hx or h/o childhood pna/ asthma or knowledge of premature birth.  Sleeping ok without nocturnal  or early am exacerbation  of respiratory  c/o's or need for noct saba. Also denies any obvious fluctuation of symptoms with weather or environmental changes or other aggravating or alleviating factors except as outlined above   Current Medications, Allergies, Complete Past Medical History, Past Surgical History, Family History, and Social History were reviewed in [employer] record.               Review of Systems     Objective:   Physical Exam    amb wf freq throat clearing     Wt Readings from Last 3 Encounters:  10/15/14 185 lb (83.915 kg)  10/01/14 184 lb (83.462 kg)  06/25/14 175 lb  (79.379 kg)    Vital signs reviewed   HEENT: nl dentition, turbinates, and orophanx. Nl external ear canals without cough reflex   NECK :  without JVD/Nodes/TM/ nl carotid upstrokes bilaterally   LUNGS: no acc muscle use, clear to A and P bilaterally without cough on insp or exp maneuvers   CV:  RRR  no s3 or murmur or increase in P2, no edema   ABD:  soft and nontender with nl excursion in the supine position. No bruits or organomegaly, bowel sounds nl  MS:  warm without deformities, calf tenderness, cyanosis or clubbing  SKIN: warm and dry without lesions    NEURO:  alert, approp, no deficits     I personally reviewed images and agree with radiology impression as follows:  CTa 10/15/2014  1. No evidence of acute pulmonary embolism or other acute chest process. 2. Mild emphysema. 3. Mild atherosclerosis. 4. Indeterminate soft tissue nodularity posterior to the descending thoracic aorta. This does not appear vascular but is adjacent to an intercostal vessel. This is of doubtful significance and may reflect a lymph node or pleural plaque.      Assessment:

## 2014-10-15 NOTE — Telephone Encounter (Signed)
LMTCB for the pt 

## 2014-10-16 ENCOUNTER — Encounter: Payer: Self-pay | Admitting: Internal Medicine

## 2014-10-16 DIAGNOSIS — R05 Cough: Secondary | ICD-10-CM | POA: Insufficient documentation

## 2014-10-16 DIAGNOSIS — R058 Other specified cough: Secondary | ICD-10-CM | POA: Insufficient documentation

## 2014-10-16 NOTE — Assessment & Plan Note (Signed)
The most common causes of chronic cough in immunocompetent adults include the following: upper airway cough syndrome (UACS), previously referred to as postnasal drip syndrome (PNDS), which is caused by variety of rhinosinus conditions; (2) asthma; (3) GERD; (4) chronic bronchitis from cigarette smoking or other inhaled environmental irritants; (5) nonasthmatic eosinophilic bronchitis; and (6) bronchiectasis.   These conditions, singly or in combination, have accounted for up to 94% of the causes of chronic cough in prospective studies.   Other conditions have constituted no >6% of the causes in prospective studies These have included bronchogenic carcinoma, chronic interstitial pneumonia, sarcoidosis, left ventricular failure, ACEI-induced cough, and aspiration from a condition associated with pharyngeal dysfunction.    Chronic cough is often simultaneously caused by more than one condition. A single cause has been found from 38 to 82% of the time, multiple causes from 18 to 62%. Multiply caused cough has been the result of three diseases up to 42% of the time.       Based on hx and exam, this is most likely:  Classic Upper airway cough syndrome, so named because it's frequently impossible to sort out how much is  CR/sinusitis with freq throat clearing (which can be related to primary GERD)   vs  causing  secondary (" extra esophageal")  GERD from wide swings in gastric pressure that occur with throat clearing, often  promoting self use of mint and menthol lozenges that reduce the lower esophageal sphincter tone and exacerbate the problem further in a cyclical fashion.   These are the same pts (now being labeled as having "irritable larynx syndrome" by some cough centers) who not infrequently have a history of having failed to tolerate ace inhibitors,  dry powder inhalers or biphosphonates or report having atypical reflux symptoms that don't respond to standard doses of PPI , and are easily confused as  having aecopd or asthma flares by even experienced allergists/ pulmonologists.   The first step is to maximize acid suppression and eliminate cyclical coughing then regroup with pfts in 2 weeks to sort out her other complaints  I had an extended discussion with the patient reviewing all relevant studies completed to date and  lasting 35 min  1) Explained: The standardized cough guidelines published in Chest by Lissa Morales in 2006 are still the best available and consist of a multiple step process (up to 12!) , not a single office visit,  and are intended  to address this problem logically,  with an alogrithm dependent on response to empiric treatment at  each progressive step  to determine a specific diagnosis with  minimal addtional testing needed. Therefore if adherence is an issue or can't be accurately verified,  it's very unlikely the standard evaluation and treatment will be successful here.    Furthermore, response to therapy (other than acute cough suppression, which should only be used short term with avoidance of narcotic containing cough syrups if possible), can be a gradual process for which the patient may perceive immediate benefit.  Unlike going to an eye doctor where the best perscription is almost always the first one and is immediately effective, this is almost never the case in the management of chronic cough syndromes. Therefore the patient needs to commit up front to consistently adhere to recommendations  for up to 6 weeks of therapy directed at the likely underlying problem(s) before the response can be reasonably evaluated.     2) Each maintenance medication was reviewed in detail including most importantly the difference  between maintenance and prns and under what circumstances the prns are to be triggered using an action plan format that is not reflected in the computer generated alphabetically organized AVS.    Please see instructions for details which were reviewed in  writing and the patient given a copy highlighting the part that I personally wrote and discussed at today's ov.   See instructions for specific recommendations which were reviewed directly with the patient who was given a copy with highlighter outlining the key components.

## 2014-10-16 NOTE — Telephone Encounter (Signed)
Patient returned call, please call [phone].

## 2014-10-16 NOTE — Progress Notes (Signed)
Quick Note:  Spoke with pt and notified of results per Dr. Wert. Pt verbalized understanding and denied any questions.  ______ 

## 2014-10-16 NOTE — Telephone Encounter (Signed)
Spoke with pt and notified of results per Dr. Wert. Pt verbalized understanding and denied any questions. 

## 2014-10-16 NOTE — Assessment & Plan Note (Addendum)
Symptoms are markedly disproportionate to objective findings and not clear this is a lung problem but pt does appear to have difficult airway management issues.  DDX of  difficult airways management all start with A and  include Adherence, Ace Inhibitors, Acid Reflux, Active Sinus Disease, Alpha 1 Antitripsin deficiency, Anxiety masquerading as Airways dz,  ABPA,  allergy(esp in young), Aspiration (esp in elderly), Adverse effects of meds,  Active smokers, A bunch of PE's (a small clot burden can't cause this syndrome unless there is already severe underlying pulm or vascular dz with poor reserve) plus two Bs  = Bronchiectasis and Beta blocker use..and one C= CHF   Adherence is always the initial "prime suspect" and is a multilayered concern that requires a "trust but verify" approach in every patient - starting with knowing how to use medications, especially inhalers, correctly, keeping up with refills and understanding the fundamental difference between maintenance and prns vs those medications only taken for a very short course and then stopped and not refilled.  - The proper method of use, as well as anticipated side effects, of a metered-dose inhaler are discussed and demonstrated to the patient. Improved effectiveness after extensive coaching during this visit to a level of approximately  75% so continue symbicort int he 80 strength 2bid  ? Acid (or non-acid) GERD > always difficult to exclude as up to 75% of pts in some series report no assoc GI/ Heartburn symptoms> rec max (24h)  acid suppression and diet restrictions/ reviewed and instructions given in writing.   ? Anxiety > usually dx of exclusion but much higher in the ddx here.  ? Active sinus dz > excluded by sinus ct 10/15/2014    ? Allergies/ asthma > I strongly doubt that she went from no asthma to refractory asthma symptoms and for now would simply continue the Symbicort at the dose of 82 puffs twice a day to cover the possibility that  asthma is present although I certainly could not detect it today.  Will need pfts to sort out at this point

## 2014-10-16 NOTE — Assessment & Plan Note (Signed)
Body mass index is 32.25    Lab Results  Component Value Date   TSH 2.703 10/01/2014     Contributing to gerd tendency/ doe/reviewed need  achieve and maintain neg calorie balance > defer f/u primary care including intermittently monitoring thyroid status

## 2014-11-18 ENCOUNTER — Other Ambulatory Visit: Payer: Self-pay | Admitting: Internal Medicine

## 2014-11-18 DIAGNOSIS — R0600 Dyspnea, unspecified: Secondary | ICD-10-CM

## 2014-11-19 ENCOUNTER — Ambulatory Visit (INDEPENDENT_AMBULATORY_CARE_PROVIDER_SITE_OTHER): Payer: 59 | Admitting: Internal Medicine

## 2014-11-19 ENCOUNTER — Other Ambulatory Visit (INDEPENDENT_AMBULATORY_CARE_PROVIDER_SITE_OTHER): Payer: 59

## 2014-11-19 ENCOUNTER — Encounter: Payer: Self-pay | Admitting: Internal Medicine

## 2014-11-19 VITALS — BP 136/80 | HR 74 | Ht 64.5 in | Wt 189.0 lb

## 2014-11-19 DIAGNOSIS — E669 Obesity, unspecified: Secondary | ICD-10-CM | POA: Diagnosis not present

## 2014-11-19 DIAGNOSIS — R05 Cough: Secondary | ICD-10-CM

## 2014-11-19 DIAGNOSIS — R058 Other specified cough: Secondary | ICD-10-CM

## 2014-11-19 DIAGNOSIS — R0600 Dyspnea, unspecified: Secondary | ICD-10-CM

## 2014-11-19 LAB — PULMONARY FUNCTION TEST
DL/VA % pred: 96 %
DL/VA: 4.71 ml/min/mmHg/L
DLCO UNC % PRED: 97 %
DLCO UNC: 24.27 ml/min/mmHg
FEF 25-75 PRE: 2.66 L/s
FEF 25-75 Post: 3.06 L/sec
FEF2575-%CHANGE-POST: 15 %
FEF2575-%Pred-Post: 133 %
FEF2575-%Pred-Pre: 115 %
FEV1-%CHANGE-POST: 3 %
FEV1-%PRED-POST: 107 %
FEV1-%PRED-PRE: 103 %
FEV1-POST: 2.75 L
FEV1-Pre: 2.66 L
FEV1FVC-%Change-Post: 2 %
FEV1FVC-%Pred-Pre: 104 %
FEV6-%Change-Post: 1 %
FEV6-%PRED-POST: 103 %
FEV6-%Pred-Pre: 102 %
FEV6-POST: 3.32 L
FEV6-PRE: 3.27 L
FEV6FVC-%PRED-POST: 104 %
FEV6FVC-%PRED-PRE: 104 %
FVC-%CHANGE-POST: 1 %
FVC-%Pred-Post: 99 %
FVC-%Pred-Pre: 98 %
FVC-Post: 3.32 L
FVC-Pre: 3.27 L
PRE FEV6/FVC RATIO: 100 %
Post FEV1/FVC ratio: 83 %
Post FEV6/FVC ratio: 100 %
Pre FEV1/FVC ratio: 81 %
RV % PRED: 90 %
RV: 1.85 L
TLC % pred: 106 %
TLC: 5.46 L

## 2014-11-19 LAB — CBC WITH DIFFERENTIAL/PLATELET
BASOS ABS: 0.1 10*3/uL (ref 0.0–0.1)
Basophils Relative: 0.7 % (ref 0.0–3.0)
EOS ABS: 1.2 10*3/uL — AB (ref 0.0–0.7)
Eosinophils Relative: 14.6 % — ABNORMAL HIGH (ref 0.0–5.0)
HCT: 41.5 % (ref 36.0–46.0)
Hemoglobin: 13.6 g/dL (ref 12.0–15.0)
LYMPHS ABS: 3.1 10*3/uL (ref 0.7–4.0)
Lymphocytes Relative: 37.9 % (ref 12.0–46.0)
MCHC: 32.8 g/dL (ref 30.0–36.0)
MCV: 87.9 fl (ref 78.0–100.0)
MONOS PCT: 8.8 % (ref 3.0–12.0)
Monocytes Absolute: 0.7 10*3/uL (ref 0.1–1.0)
NEUTROS PCT: 38 % — AB (ref 43.0–77.0)
Neutro Abs: 3.1 10*3/uL (ref 1.4–7.7)
Platelets: 266 10*3/uL (ref 150.0–400.0)
RBC: 4.72 Mil/uL (ref 3.87–5.11)
RDW: 13 % (ref 11.5–15.5)
WBC: 8.3 10*3/uL (ref 4.0–10.5)

## 2014-11-19 MED ORDER — PANTOPRAZOLE SODIUM 40 MG PO TBEC: 40.0000 mg | DELAYED_RELEASE_TABLET | Freq: Every day | ORAL | Status: DC

## 2014-11-19 MED ORDER — FAMOTIDINE 20 MG PO TABS: ORAL_TABLET | ORAL | Status: DC

## 2014-11-19 NOTE — Progress Notes (Signed)
PFT done today. 

## 2014-11-19 NOTE — Progress Notes (Signed)
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 Asthma required meds for a month or so then none and back to 100% fxn (?because moved to [facility] then moved to [place] around 2000)  then  acute onset mid Jan 2016 severe cough > ER > 50% improved on symbicort 80 2 bid and qid albuterol > referred by Dr Etter Sjogren to pulmonary clinic 10/15/2014 with persistent daily symptoms of coughing and chest burning.    History of Present Illness  10/15/2014 first pulmonary eval / [facility]   Chief Complaint  Patient presents with  . Pulmonary Consult    Referred by Dr Etter Sjogren. Pt c/o cough and SOB since Jan 2016. Pt states that she is SOB "all the time".  She c/o "burning in chest".   ex tolerance = 70 min brisk walk at baseline, now only 45 m L chest discomfort some better since onset Jan 2016 worse lying down located laterally and posteriorly on the left and recently posteriorly on the right as well  she describes her cough is productive  But most  of her coughing is dry with no history of any hemoptysis rec   Pantoprazole (protonix) 40 mg   Take  30-60 min before first meal of the day and Pepcid (famotidine)  20 mg one @  bedtime until return to office - this is the best way to tell whether stomach acid is contributing to your problem.   GERD diet    Continue symbicort 80 Take 2 puffs first thing in am and then another 2 puffs about 12 hours later.  Only use your albuterol  Please see patient coordinator before you leave today  to schedule CTangiogram and sinus ct done> neg   Please schedule a follow up office visit in 4 weeks, sooner if needed - it needs to be first thing in am with pfts done before am symbicort       11/19/2014  f/u ov/[facility] re:  Unexplained sob / pfts nl x for low erv  Chief Complaint  Patient presents with  . Follow-up   PFT done today. Pt states that her breathing has improved some but not back to baseline since last visit. No new co's today.       75% better >>>  still doe and occ throat clearing / on symbicort 80 2bid and no need for saba  No obvious day to day or daytime variability or assoc chronic cough or cp or chest tightness, subjective wheeze or overt sinus or hb symptoms. No unusual exp hx or h/o childhood pna/ asthma or knowledge of premature birth.  Sleeping ok without nocturnal  or early am exacerbation  of respiratory  c/o's or need for noct saba. Also denies any obvious fluctuation of symptoms with weather or environmental changes or other aggravating or alleviating factors except as outlined above   Current Medications, Allergies, Complete Past Medical History, Past Surgical History, Family History, and Social History were reviewed in [employer] record.  ROS  The following are not active complaints unless bolded sore throat, dysphagia, dental problems, itching, sneezing,  nasal congestion or excess/ purulent secretions, ear ache,   fever, chills, sweats, unintended wt loss, classically pleuritic or exertional cp, hemoptysis,  orthopnea pnd or leg swelling, presyncope, palpitations, abdominal  pain, anorexia, nausea, vomiting, diarrhea  or change in bowel or bladder habits, change in stools or urine, dysuria,hematuria,  rash, arthralgias, visual complaints, headache, numbness, weakness or ataxia or problems with walking or coordination,  change in mood/affect or memory.                         Objective:   Physical Exam    amb anxious mod obese  wf  nad but still some throat clearing    11/19/2014         189  Wt Readings from Last 3 Encounters:  10/15/14 185 lb (83.915 kg)  10/01/14 184 lb (83.462 kg)  06/25/14 175 lb (79.379 kg)    Vital signs reviewed   HEENT: nl dentition, turbinates, and orophanx. Nl external ear canals without cough reflex   NECK  :  without JVD/Nodes/TM/ nl carotid upstrokes bilaterally   LUNGS: no acc muscle use, clear to A and P bilaterally without cough on insp or exp maneuvers   CV:  RRR  no s3 or murmur or increase in P2, no edema   ABD:  soft and nontender with nl excursion in the supine position. No bruits or organomegaly, bowel sounds nl  MS:  warm without deformities, calf tenderness, cyanosis or clubbing  SKIN: warm and dry without lesions    NEURO:  alert, approp, no deficits     I personally reviewed images and agree with radiology impression as follows:  CTa 10/15/2014  1. No evidence of acute pulmonary embolism or other acute chest process. 2. Mild emphysema. 3. Mild atherosclerosis. 4. Indeterminate soft tissue nodularity posterior to the descending thoracic aorta. This does not appear vascular but is adjacent to an intercostal vessel. This is of doubtful significance and may reflect a lymph node or pleural plaque.      Assessment:

## 2014-11-19 NOTE — Patient Instructions (Addendum)
For drainage / throat tickle try take CHLORPHENIRAMINE  4 mg - take one every 4 hours as needed - available over the counter- may cause drowsiness so start with just a bedtime dose or two and see how you tolerate it before trying in daytime    For cough as needed  Take delsym two tsp every 12 hours to suppress the urge to cough. Swallowing water or using ice chips/non mint and menthol containing candies (such as lifesavers or sugarless jolly ranchers) are also effective.  You should rest your voice and avoid activities that you know make you cough.  Please remember to go to the lab department downstairs for your tests - we will call you with the results when they are available. If you have significant allergies then a trial of singulair may need to be considered > late add singulair 10 mg daily

## 2014-11-20 ENCOUNTER — Telehealth: Payer: Self-pay | Admitting: *Deleted

## 2014-11-20 ENCOUNTER — Encounter: Payer: Self-pay | Admitting: Internal Medicine

## 2014-11-20 LAB — ALLERGY FULL PROFILE
Allergen, D pternoyssinus,d7: <0.1 kU/L
Allergen,Goose feathers, e70: <0.1 kU/L
Aspergillus fumigatus, m3: <0.1 kU/L
Bermuda Grass: <0.1 kU/L
Common Ragweed: <0.1 kU/L
D. farinae: <0.1 kU/L
G005 Rye, Perennial: <0.1 kU/L
G009 Red Top: <0.1 kU/L
House Dust Hollister: <0.1 kU/L
IGE (IMMUNOGLOBULIN E), SERUM: 16 kU/L (ref <115)
Oak: <0.1 kU/L
Stemphylium Botryosum: <0.1 kU/L
Sycamore Tree: <0.1 kU/L
Timothy Grass: <0.1 kU/L

## 2014-11-20 MED ORDER — MONTELUKAST SODIUM 10 MG PO TABS: 10.0000 mg | ORAL_TABLET | Freq: Every day | ORAL | Status: DC

## 2014-11-20 NOTE — Assessment & Plan Note (Addendum)
Sinus ct 10/15/2014 neg  - Allergy profile 11/19/2014     Eos 1.2, IgE  Although there is no definite evidence of asthma here, she very well could have enough chronic rhinitis/postnasal drip syndrome to cause coughing which in turn causes reflux which in turn causes more coughing and a repeat/ cyclical fashion.  This is very difficult to distinguish from asthma.  Since Singulair may be beneficial in both settings are recommended a trial of 10 mg daily and continue for now Symbicort 80 taken 2 puffs every 12 hours.

## 2014-11-20 NOTE — Telephone Encounter (Signed)
Spoke with pt. She is aware that MW would like her to start Singulair. States that she has already picked this up from the pharmacy. Nothing further was needed.

## 2014-11-20 NOTE — Assessment & Plan Note (Signed)
Body mass index is 31.95   Trending down   Lab Results  Component Value Date   TSH 2.703 10/01/2014     Contributing to gerd tendency/ doe/reviewed the need and the process to achieve and maintain neg calorie balance > defer f/u primary care including intermittently monitoring thyroid status

## 2014-11-20 NOTE — Telephone Encounter (Signed)
LMTCB

## 2014-11-20 NOTE — Telephone Encounter (Signed)
-----   Message from Tanda Rockers, MD sent at 11/20/2014 11:23 AM EDT ----- Let her know on the basis of the eosinophilia in her blood she probably does have an underlying allergic form of rhinitis and might benefit from Singulair trial so I call this in for her already and will be back in touch with specific allergy information when available.

## 2014-11-20 NOTE — Telephone Encounter (Signed)
Pt returned call  [phone]

## 2014-11-20 NOTE — Assessment & Plan Note (Signed)
CTa 10/15/2014 > neg - PFT's  11/19/2014  FEV1 2.75  (83 % ) ratio 3  p 3 % improvement from saba with DLCO  97 % corrects to 96 % for alv volume  And ERV 20%   Her remaining symptoms do not suggest that all and airflow limitation but are rather due to obesity with deconditioning. I reviewed her PFTs whether emphasizing the importance of reconditioning with consideration for CPST if she is not able to return back to baseline..  I had an extended discussion with the patient/husband reviewing all relevant studies completed to date and  lasting 15 to 20 minutes of a 25 minute visit    Each maintenance medication was reviewed in detail including most importantly the difference between maintenance and prns and under what circumstances the prns are to be triggered using an action plan format that is not reflected in the computer generated alphabetically organized AVS.    Please see instructions for details which were reviewed in writing and the patient given a copy highlighting the part that I personally wrote and discussed at today's ov.

## 2015-01-01 ENCOUNTER — Telehealth: Payer: Self-pay | Admitting: Family Medicine

## 2015-01-01 DIAGNOSIS — R0600 Dyspnea, unspecified: Secondary | ICD-10-CM

## 2015-01-01 DIAGNOSIS — Z8709 Personal history of other diseases of the respiratory system: Secondary | ICD-10-CM

## 2015-01-01 NOTE — Telephone Encounter (Signed)
Relation to [id number] Call back number:[phone] Pharmacy: [facility] [place] [phone] (Phone) [phone] (Fax)        Reason for call:   Patient requesting a refill of the following medication, patient states she no longer sees Dr. Melvyn Novas and would like PCP to take over refilling medication, patient states she is completey out. Please advise budesonide-formoterol (SYMBICORT) 80-4.5 MCG/ACT inhaler famotidine (PEPCID) 20 MG tablet  (patient is no longer to Dr. Melvyn Novas) pantoprazole (PROTONIX) 40 MG tablet  (patient is no longer to Dr. Melvyn Novas)

## 2015-01-01 NOTE — Telephone Encounter (Signed)
Please advise if these refills are appropriate.     KP 

## 2015-01-02 MED ORDER — PANTOPRAZOLE SODIUM 40 MG PO TBEC: 40.0000 mg | DELAYED_RELEASE_TABLET | Freq: Every day | ORAL | Status: DC

## 2015-01-02 MED ORDER — FAMOTIDINE 20 MG PO TABS: ORAL_TABLET | ORAL | Status: DC

## 2015-01-02 MED ORDER — BUDESONIDE-FORMOTEROL FUMARATE 80-4.5 MCG/ACT IN AERO: INHALATION_SPRAY | RESPIRATORY_TRACT | Status: DC

## 2015-01-02 NOTE — Telephone Encounter (Signed)
Ok to refill for 1 year 

## 2015-01-02 NOTE — Telephone Encounter (Signed)
Rx faxed.    KP 

## 2015-01-14 ENCOUNTER — Other Ambulatory Visit: Payer: Self-pay | Admitting: Family Medicine

## 2015-01-28 ENCOUNTER — Encounter: Payer: Self-pay | Admitting: Family Medicine

## 2015-01-28 ENCOUNTER — Ambulatory Visit (INDEPENDENT_AMBULATORY_CARE_PROVIDER_SITE_OTHER): Payer: 59 | Admitting: Family Medicine

## 2015-01-28 VITALS — BP 122/68 | HR 79 | Temp 98.0°F | Ht 65.0 in | Wt 181.6 lb

## 2015-01-28 DIAGNOSIS — Z1159 Encounter for screening for other viral diseases: Secondary | ICD-10-CM | POA: Diagnosis not present

## 2015-01-28 DIAGNOSIS — I10 Essential (primary) hypertension: Secondary | ICD-10-CM

## 2015-01-28 DIAGNOSIS — R739 Hyperglycemia, unspecified: Secondary | ICD-10-CM | POA: Diagnosis not present

## 2015-01-28 DIAGNOSIS — R7303 Prediabetes: Secondary | ICD-10-CM | POA: Diagnosis not present

## 2015-01-28 DIAGNOSIS — E785 Hyperlipidemia, unspecified: Secondary | ICD-10-CM | POA: Diagnosis not present

## 2015-01-28 NOTE — Assessment & Plan Note (Signed)
con't watch diet Check labs

## 2015-01-28 NOTE — Assessment & Plan Note (Signed)
stable °

## 2015-01-28 NOTE — Progress Notes (Signed)
Pre visit review using our clinic review tool, if applicable. No additional management support is needed unless otherwise documented below in the visit note. 

## 2015-01-28 NOTE — Patient Instructions (Signed)

## 2015-01-28 NOTE — Progress Notes (Signed)
Patient ID: Michelle [name], female    DOB: 01-29-1956  Age: 59 y.o. MRN: [id number]    Subjective:  Subjective HPI RAND BOLLER presents for f/u of borderline dm and lung issues.  Pt feeling better after starting meds for gerd.  Her fasting bs this am was 95.    Review of Systems  Constitutional: Negative for diaphoresis, appetite change, fatigue and unexpected weight change.  Eyes: Negative for pain, redness and visual disturbance.  Respiratory: Negative for cough, chest tightness, shortness of breath and wheezing.   Cardiovascular: Negative for chest pain, palpitations and leg swelling.  Endocrine: Negative for cold intolerance, heat intolerance, polydipsia, polyphagia and polyuria.  Genitourinary: Negative for dysuria, frequency and difficulty urinating.  Neurological: Negative for dizziness, light-headedness, numbness and headaches.    History Past Medical History  Diagnosis Date  . Asthma   . Hyperlipidemia   . Hypertension   . Thyroid disease   . Obesity   . Diabetes mellitus without complication ([place])   . Bronchitis   . Allergy     She has past surgical history that includes Tonsillectomy and adenoidectomy (childhood); Tubal ligation; and Vaginal hysterectomy (2011).   Her family history includes Heart disease in her mother; Hyperlipidemia in her mother; Hypertension in her mother; Stroke in her mother.She reports that she has never smoked. She has never used smokeless tobacco. She reports that she does not drink alcohol or use illicit drugs.  Current Outpatient Prescriptions on File Prior to Visit  Medication Sig Dispense Refill  . albuterol (PROVENTIL HFA;VENTOLIN HFA) 108 (90 BASE) MCG/ACT inhaler INhale 1 puffs into lungs every 8 hours prn wheezing or SOB 1 Inhaler 0  . ALPHA LIPOIC ACID PO Take 1-2 capsules by mouth 2 (two) times daily.      . AMBULATORY NON FORMULARY MEDICATION Medication Name: Biest/Progesterone/Testosterone/DHEA 2.5/250/0.5/72m    .  aspirin 81 MG EC tablet Take 81 mg by mouth daily. Swallow whole.     . budesonide-formoterol (SYMBICORT) 80-4.5 MCG/ACT inhaler 2 puffs bid (Patient taking differently: Inhale 1 puff into the lungs 2 (two) times daily. ) 1 Inhaler 11  . Calcium-Magnesium (CAL-MAG PO) Take 1 tablet by mouth daily.    . Cholecalciferol (VITAMIN D3) 5000 UNITS CAPS Take 1 capsule by mouth daily.    . famotidine (PEPCID) 20 MG tablet One at bedtime 90 tablet 3  . metFORMIN (GLUCOPHAGE-XR) 500 MG 24 hr tablet TAKE 1 TABLET (500 MG TOTAL) BY MOUTH 2 TIMES A DAY. 180 tablet 1  . MULTIPLE VITAMIN PO Take 1 tablet by mouth daily.      . pantoprazole (PROTONIX) 40 MG tablet Take 1 tablet (40 mg total) by mouth daily. Take 30-60 min before first meal of the day 90 tablet 3  . PROBIOTIC CAPS Take 1 capsule by mouth daily.      . simvastatin (ZOCOR) 20 MG tablet Take 1 tablet (20 mg total) by mouth daily. 90 tablet 1   No current facility-administered medications on file prior to visit.     Objective:  Objective Physical Exam  Constitutional: She is oriented to person, place, and time. She appears well-developed and well-nourished.  HENT:  Head: Normocephalic and atraumatic.  Eyes: Conjunctivae and EOM are normal.  Neck: Normal range of motion. Neck supple. No JVD present. Carotid bruit is not present. No thyromegaly present.  Cardiovascular: Normal rate, regular rhythm and normal heart sounds.   No murmur heard. Pulmonary/Chest: Effort normal and breath sounds normal. No respiratory distress. She  has no wheezes. She has no rales. She exhibits no tenderness.  Musculoskeletal: She exhibits no edema.  Neurological: She is alert and oriented to person, place, and time.  Psychiatric: She has a normal mood and affect. Her behavior is normal.  Nursing note and vitals reviewed.  BP 122/68 mmHg  Pulse 79  Temp(Src) 98 F (36.7 C) (Oral)  Ht _0  (1.651 m)  Wt 181 lb 9.6 oz (82.373 kg)  BMI 30.22 kg/m2  SpO2 98% Wt  Readings from Last 3 Encounters:  01/28/15 181 lb 9.6 oz (82.373 kg)  11/19/14 189 lb (85.73 kg)  10/15/14 185 lb (83.915 kg)     Lab Results  Component Value Date   WBC 8.3 11/19/2014   HGB 13.6 11/19/2014   HCT 41.5 11/19/2014   PLT 266.0 11/19/2014   GLUCOSE 105* 10/01/2014   CHOL 147 04/19/2014   TRIG 65 04/19/2014   HDL 61 04/19/2014   LDLCALC 73 04/19/2014   ALT 22 10/01/2014   AST 16 10/01/2014   NA 139 10/01/2014   K 4.1 10/01/2014   CL 104 10/01/2014   CREATININE 0.68 10/01/2014   BUN 15 10/01/2014   CO2 29 10/01/2014   TSH 2.703 10/01/2014    Ct Angio Chest Pe W/cm &/or Wo Cm  10/15/2014  CLINICAL DATA:  Seven month history of left-sided chest pain, shortness of breath with exertion and productive cough. Evaluate for pulmonary embolism. Initial encounter. EXAM: CT ANGIOGRAPHY CHEST WITH CONTRAST TECHNIQUE: Multidetector CT imaging of the chest was performed using the standard protocol during bolus administration of intravenous contrast. Multiplanar CT image reconstructions and MIPs were obtained to evaluate the vascular anatomy. CONTRAST:  4m OMNIPAQUE IOHEXOL 350 MG/ML SOLN COMPARISON:  Radiographs 03/14/2014. FINDINGS: Mediastinum: The pulmonary arteries are well opacified with contrast. There is no evidence of acute pulmonary embolism. There is minimal atherosclerosis of the descending aorta and coronary arteries.No enlarged mediastinal, hilar or axillary lymph nodes identified. Prominent soft tissue posterior to the descending aorta is noted around an intercostal branch. This is most obvious on coronal image 79 and sagittal image 84. The heart size is normal. There is no pericardial effusion. The thyroid gland, trachea and esophagus demonstrate no significant findings. Lungs/Pleura: There is no pleural effusion.Mild emphysema and mild dependent atelectasis at both lung bases. No suspicious pulmonary findings. Upper abdomen: Unremarkable.  There is no adrenal mass.  Musculoskeletal/Chest wall: No chest wall lesion or acute osseous findings. Review of the MIP images confirms the above findings. IMPRESSION: 1. No evidence of acute pulmonary embolism or other acute chest process. 2. Mild emphysema. 3. Mild atherosclerosis. 4. Indeterminate soft tissue nodularity posterior to the descending thoracic aorta. This does not appear vascular but is adjacent to an intercostal vessel. This is of doubtful significance and may reflect a lymph node or pleural plaque. Electronically Signed   By: WRichardean SaleM.D.   On: 10/15/2014 14:44   CSadorusCm  10/15/2014  CLINICAL DATA:  Dyspnea and productive cough. Evaluate for sinusitis. EXAM: CT PARANASAL SINUS LIMITED WITHOUT CONTRAST TECHNIQUE: Non-contiguous multidetector CT images of the paranasal sinuses were obtained in a single plane without contrast. COMPARISON:  06/20/2007 head CT FINDINGS: Mild mucosal thickening in the inferior left maxillary sinus. No indication of acute sinusitis. Mild rightward nasal septal deviation spurring without turbinate contact. Left middle concha bullosa. Partial intracranial imaging is negative. Bilateral optic drusen noted, incidental based on the history. IMPRESSION: Mild chronic appearing left frontal sinusitis. Electronically Signed  By: Monte Fantasia M.D.   On: 10/15/2014 14:55     Assessment & Plan:  Plan I have discontinued Ms. Kato's montelukast. I am also having her maintain her aspirin, MULTIPLE VITAMIN PO, Probiotic, ALPHA LIPOIC ACID PO, AMBULATORY NON FORMULARY MEDICATION, Calcium-Magnesium (CAL-MAG PO), albuterol, simvastatin, Vitamin D3, pantoprazole, famotidine, budesonide-formoterol, and metFORMIN.  No orders of the defined types were placed in this encounter.    Problem List Items Addressed This Visit    HTN (hypertension)    stable      Borderline diabetes  DX Dr Sharol Roussel    con't watch diet Check labs         Other Visit Diagnoses     Hyperglycemia    -  Primary    Relevant Orders    Hemoglobin A1c    Lipid panel    Comp Met (CMET)    Hyperlipidemia LDL goal <70        Relevant Orders    Hemoglobin A1c    Lipid panel    Comp Met (CMET)    Need for hepatitis C screening test        Relevant Orders    Hepatitis C antibody       Follow-up: Return in about 6 months (around 07/29/2015), or if symptoms worsen or fail to improve, for annual exam, fasting.  Garnet Koyanagi, DO

## 2015-01-29 ENCOUNTER — Other Ambulatory Visit (INDEPENDENT_AMBULATORY_CARE_PROVIDER_SITE_OTHER): Payer: 59

## 2015-01-29 DIAGNOSIS — R739 Hyperglycemia, unspecified: Secondary | ICD-10-CM

## 2015-01-29 DIAGNOSIS — E785 Hyperlipidemia, unspecified: Secondary | ICD-10-CM

## 2015-01-29 DIAGNOSIS — Z1159 Encounter for screening for other viral diseases: Secondary | ICD-10-CM

## 2015-01-29 LAB — COMPREHENSIVE METABOLIC PANEL
ALBUMIN: 4 g/dL (ref 3.5–5.2)
ALT: 19 U/L (ref 0–35)
AST: 16 U/L (ref 0–37)
Alkaline Phosphatase: 35 U/L — ABNORMAL LOW (ref 39–117)
BUN: 15 mg/dL (ref 6–23)
CALCIUM: 9.1 mg/dL (ref 8.4–10.5)
CHLORIDE: 105 meq/L (ref 96–112)
CO2: 28 mEq/L (ref 19–32)
Creatinine, Ser: 0.71 mg/dL (ref 0.40–1.20)
GFR: 88.69 mL/min (ref >60.00)
Glucose, Bld: 92 mg/dL (ref 70–99)
POTASSIUM: 4.5 meq/L (ref 3.5–5.1)
SODIUM: 140 meq/L (ref 135–145)
Total Bilirubin: 0.5 mg/dL (ref 0.2–1.2)
Total Protein: 6.7 g/dL (ref 6.0–8.3)

## 2015-01-29 LAB — HEPATITIS C ANTIBODY: HCV AB: NEGATIVE

## 2015-01-29 LAB — LIPID PANEL
CHOLESTEROL: 141 mg/dL (ref 0–200)
HDL: 47.4 mg/dL (ref >39.00)
LDL Cholesterol: 77 mg/dL (ref 0–99)
NonHDL: 93.13
Total CHOL/HDL Ratio: 3
Triglycerides: 81 mg/dL (ref 0.0–149.0)
VLDL: 16.2 mg/dL (ref 0.0–40.0)

## 2015-01-29 LAB — HEMOGLOBIN A1C: HEMOGLOBIN A1C: 5.7 % (ref 4.6–6.5)

## 2015-01-30 ENCOUNTER — Telehealth: Payer: Self-pay | Admitting: Family Medicine

## 2015-01-30 NOTE — Telephone Encounter (Signed)
I removed flu vaccine form her Health maintenance due to allergy on 01/29/15.    KP

## 2015-01-30 NOTE — Telephone Encounter (Signed)
Declined Flu Shot. Patient is allergic to the flu shot

## 2015-02-05 ENCOUNTER — Telehealth: Payer: Self-pay | Admitting: Family Medicine

## 2015-02-05 NOTE — Telephone Encounter (Signed)
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

## 2015-02-06 ENCOUNTER — Telehealth: Payer: Self-pay | Admitting: Family Medicine

## 2015-02-06 NOTE — Telephone Encounter (Signed)
Caller name:Janeah Relationship to patient:self Can be [phone] Pharmacy:  Reason for call:Dropped off paperwork for work that will allow her to work first shift

## 2015-02-06 NOTE — Telephone Encounter (Signed)
Patient called back today about the form she needs filled out by her PCP to change shifts at work. State she will drop forms off at front desk today.

## 2015-02-07 NOTE — Telephone Encounter (Signed)
Forwarded to Dr. Lowne. JG//CMA  

## 2015-02-11 NOTE — Telephone Encounter (Signed)
Received completed and signed forms from provider; patient informed ready for pick-up at front desk during regular business hours, copy made for scan/SLS

## 2015-02-21 MED FILL — SYMBICORT 80-4.5 MCG INH: 80-4.5 | 30 days supply | Qty: 10 | Fill #1

## 2015-03-04 ENCOUNTER — Other Ambulatory Visit: Payer: Self-pay | Admitting: Family Medicine

## 2015-03-05 MED FILL — SIMVASTATIN 20 MG TABLET: 20 | 90 days supply | Qty: 90 | Fill #0

## 2015-03-12 DIAGNOSIS — Z01419 Encounter for gynecological examination (general) (routine) without abnormal findings: Secondary | ICD-10-CM | POA: Diagnosis not present

## 2015-03-12 DIAGNOSIS — Z13820 Encounter for screening for osteoporosis: Secondary | ICD-10-CM | POA: Diagnosis not present

## 2015-03-12 DIAGNOSIS — Z6832 Body mass index (BMI) 32.0-32.9, adult: Secondary | ICD-10-CM | POA: Diagnosis not present

## 2015-03-13 ENCOUNTER — Other Ambulatory Visit (HOSPITAL_BASED_OUTPATIENT_CLINIC_OR_DEPARTMENT_OTHER): Payer: Self-pay | Admitting: Obstetrics and Gynecology

## 2015-03-13 DIAGNOSIS — M810 Age-related osteoporosis without current pathological fracture: Secondary | ICD-10-CM

## 2015-03-13 DIAGNOSIS — Z1231 Encounter for screening mammogram for malignant neoplasm of breast: Secondary | ICD-10-CM

## 2015-03-13 DIAGNOSIS — Z299 Encounter for prophylactic measures, unspecified: Secondary | ICD-10-CM

## 2015-03-18 DIAGNOSIS — E119 Type 2 diabetes mellitus without complications: Secondary | ICD-10-CM | POA: Diagnosis not present

## 2015-03-18 DIAGNOSIS — H52223 Regular astigmatism, bilateral: Secondary | ICD-10-CM | POA: Diagnosis not present

## 2015-03-18 DIAGNOSIS — H5203 Hypermetropia, bilateral: Secondary | ICD-10-CM | POA: Diagnosis not present

## 2015-03-18 DIAGNOSIS — Z7984 Long term (current) use of oral hypoglycemic drugs: Secondary | ICD-10-CM | POA: Diagnosis not present

## 2015-03-18 DIAGNOSIS — H524 Presbyopia: Secondary | ICD-10-CM | POA: Diagnosis not present

## 2015-03-18 LAB — HM DIABETES EYE EXAM

## 2015-03-25 MED FILL — FAMOTIDINE 20 MG TABLET: 20 | 90 days supply | Qty: 90 | Fill #1

## 2015-03-25 MED FILL — PANTOPRAZOLE SOD DR 40 MG T: 40 | 90 days supply | Qty: 90 | Fill #1

## 2015-03-31 ENCOUNTER — Ambulatory Visit (HOSPITAL_BASED_OUTPATIENT_CLINIC_OR_DEPARTMENT_OTHER)
Admission: RE | Admit: 2015-03-31 | Discharge: 2015-03-31 | Disposition: A | Discharge status: Home / Self Care | Payer: 59 | Source: Ambulatory Visit | Attending: Obstetrics and Gynecology | Admitting: Obstetrics and Gynecology

## 2015-03-31 DIAGNOSIS — M810 Age-related osteoporosis without current pathological fracture: Secondary | ICD-10-CM | POA: Insufficient documentation

## 2015-03-31 DIAGNOSIS — Z1231 Encounter for screening mammogram for malignant neoplasm of breast: Secondary | ICD-10-CM | POA: Diagnosis not present

## 2015-03-31 DIAGNOSIS — Z13820 Encounter for screening for osteoporosis: Secondary | ICD-10-CM | POA: Diagnosis not present

## 2015-03-31 DIAGNOSIS — Z299 Encounter for prophylactic measures, unspecified: Secondary | ICD-10-CM | POA: Insufficient documentation

## 2015-03-31 DIAGNOSIS — Z780 Asymptomatic menopausal state: Secondary | ICD-10-CM | POA: Insufficient documentation

## 2015-03-31 DIAGNOSIS — Z8262 Family history of osteoporosis: Secondary | ICD-10-CM | POA: Diagnosis not present

## 2015-04-09 ENCOUNTER — Encounter: Payer: Self-pay | Admitting: Family Medicine

## 2015-04-14 MED FILL — METFORMIN HCL ER 500 MG TAB: 500 | 90 days supply | Qty: 180 | Fill #1

## 2015-04-24 ENCOUNTER — Ambulatory Visit (INDEPENDENT_AMBULATORY_CARE_PROVIDER_SITE_OTHER): Payer: 59 | Admitting: Family Medicine

## 2015-04-24 ENCOUNTER — Encounter: Payer: Self-pay | Admitting: Family Medicine

## 2015-04-24 VITALS — BP 132/76 | HR 76 | Temp 98.4°F | Wt 179.0 lb

## 2015-04-24 DIAGNOSIS — J0140 Acute pansinusitis, unspecified: Secondary | ICD-10-CM

## 2015-04-24 MED ORDER — FLUTICASONE PROPIONATE 50 MCG/ACT NA SUSP: 2.0000 | Freq: Every day | NASAL | Status: DC

## 2015-04-24 MED ORDER — AMOXICILLIN-POT CLAVULANATE 875-125 MG PO TABS: 1.0000 | ORAL_TABLET | Freq: Two times a day (BID) | ORAL | Status: DC

## 2015-04-24 MED FILL — FLUTICASONE PROP 50 MCG SPR: 50 | 30 days supply | Qty: 16 | Fill #0

## 2015-04-24 MED FILL — AMOX-CLAV 875-125 MG TABLET: 875-125 | 10 days supply | Qty: 20 | Fill #0

## 2015-04-24 NOTE — Patient Instructions (Signed)

## 2015-04-24 NOTE — Progress Notes (Signed)
  Subjective:     Michelle [name] is a 60 y.o. female who presents for evaluation of sinus pain. Symptoms include: congestion, cough, facial pain, foul rhinorrhea and headaches. Onset of symptoms was 9 days ago. Symptoms have been gradually worsening since that time. Past history is significant for no history of pneumonia or bronchitis. Patient is a non-smoker.  The following portions of the patient's history were reviewed and updated as appropriate: allergies, current medications, past family history, past medical history, past social history, past surgical history and problem list.  Review of Systems Pertinent items are noted in HPI.   Objective:    BP 132/76 mmHg  Pulse 76  Temp(Src) 98.4 F (36.9 C) (Oral)  Wt 179 lb (81.194 kg)  SpO2 98% General appearance: alert, cooperative and no distress Ears: normal TM's and external ear canals both ears Nose: green and yellow discharge, moderate congestion, turbinates red, swollen, sinus tenderness bilateral Throat: lips, mucosa, and tongue normal; teeth and gums normal Neck: mild anterior cervical adenopathy, supple, symmetrical, trachea midline and thyroid not enlarged, symmetric, no tenderness/mass/nodules Lungs: clear to auscultation bilaterally Heart: S1, S2 normal    Assessment:    Acute bacterial sinusitis.    Plan:    Nasal steroids per medication orders. Antihistamines per medication orders. Augmentin per medication orders. f/u prn

## 2015-05-07 MED FILL — SYMBICORT 80-4.5 MCG INH: 80-4.5 | 30 days supply | Qty: 10 | Fill #2

## 2015-05-09 ENCOUNTER — Telehealth: Payer: Self-pay | Admitting: Family Medicine

## 2015-05-09 NOTE — Telephone Encounter (Signed)
Caller name: Self  Can be reached: [phone]  Pharmacy:  Reason for call: Patient called stating that she was seen on 3/9 for a sinus infection and given antibiotics. States that it had cleared up great but on this past Monday the symptoms started coming back. Would like advise on if she needs to take another antibiotic. Plse adv

## 2015-05-09 NOTE — Telephone Encounter (Signed)
She will need re-evaluation in office if symptoms are not improved.

## 2015-05-09 NOTE — Telephone Encounter (Signed)
Left detailed message on pt's voicemail to call the office and schedule appt.

## 2015-05-12 ENCOUNTER — Ambulatory Visit (INDEPENDENT_AMBULATORY_CARE_PROVIDER_SITE_OTHER): Payer: 59 | Admitting: Physician Assistant

## 2015-05-12 ENCOUNTER — Encounter: Payer: Self-pay | Admitting: Physician Assistant

## 2015-05-12 VITALS — BP 160/58 | HR 82 | Temp 98.0°F | Ht 65.0 in | Wt 179.0 lb

## 2015-05-12 DIAGNOSIS — J329 Chronic sinusitis, unspecified: Secondary | ICD-10-CM

## 2015-05-12 MED ORDER — DOXYCYCLINE HYCLATE 100 MG PO CAPS: 100.0000 mg | ORAL_CAPSULE | Freq: Two times a day (BID) | ORAL | Status: DC

## 2015-05-12 MED FILL — DOXYCYCLINE HYC 100 MG CAP: 100 | 10 days supply | Qty: 20 | Fill #0

## 2015-05-12 NOTE — Assessment & Plan Note (Signed)
Recently on Augmentin. Will begin Doxycycline for a 10-day course. Continue Flonase and antihistamine. Supportive measures reviewed. Follow-up PRN if symptoms are not resolving.

## 2015-05-12 NOTE — Progress Notes (Signed)
Pre visit review using our clinic review tool, if applicable. No additional management support is needed unless otherwise documented below in the visit note. 

## 2015-05-12 NOTE — Progress Notes (Signed)
Patient presents to clinic today c/o recurrence of sinus pressure, sinus pain (R-sided), R ear pain, tooth ache and headaches. Was seen 2 weeks ago by PCP and placed on Flonase and Augmentin (10-day course). Endorses improvement in symptoms while on antibiotics but after finishing, symptoms recurred. Denies fever, chills, aches.  Has continued Flonase and taking OTC tylenol for headaches.  Past Medical History  Diagnosis Date  . Asthma   . Hyperlipidemia   . Hypertension   . Thyroid disease   . Obesity   . Diabetes mellitus without complication ([place])   . Bronchitis   . Allergy     Current Outpatient Prescriptions on File Prior to Visit  Medication Sig Dispense Refill  . albuterol (PROVENTIL HFA;VENTOLIN HFA) 108 (90 BASE) MCG/ACT inhaler INhale 1 puffs into lungs every 8 hours prn wheezing or SOB 1 Inhaler 0  . ALPHA LIPOIC ACID PO Take 1-2 capsules by mouth 2 (two) times daily.      . AMBULATORY NON FORMULARY MEDICATION Medication Name: Biest/Progesterone/Testosterone/DHEA 2.5/250/0.5/2mg     . aspirin 81 MG EC tablet Take 81 mg by mouth daily. Swallow whole.     . budesonide-formoterol (SYMBICORT) 80-4.5 MCG/ACT inhaler 2 puffs bid (Patient taking differently: Inhale 1 puff into the lungs 2 (two) times daily. ) 1 Inhaler 11  . Calcium-Magnesium (CAL-MAG PO) Take 1 tablet by mouth daily.    . Cholecalciferol (VITAMIN D3) 5000 UNITS CAPS Take 1 capsule by mouth daily.    . famotidine (PEPCID) 20 MG tablet One at bedtime 90 tablet 3  . fluticasone (FLONASE) 50 MCG/ACT nasal spray Place 2 sprays into both nostrils daily. 16 g 6  . metFORMIN (GLUCOPHAGE-XR) 500 MG 24 hr tablet TAKE 1 TABLET (500 MG TOTAL) BY MOUTH 2 TIMES A DAY. 180 tablet 1  . MULTIPLE VITAMIN PO Take 1 tablet by mouth daily.      . pantoprazole (PROTONIX) 40 MG tablet Take 1 tablet (40 mg total) by mouth daily. Take 30-60 min before first meal of the day 90 tablet 3  . PROBIOTIC CAPS Take 1 capsule by mouth daily.        . simvastatin (ZOCOR) 20 MG tablet TAKE 1 TABLET (20 MG) BY MOUTH DAILY. 90 tablet 1   No current facility-administered medications on file prior to visit.    Allergies  Allergen Reactions  . Shrimp [Shellfish Allergy] Itching    Allergic to shrimp.  Wheezing and tightness in chest.  . Tdap [Diphth-Acell Pertussis-Tetanus] Other (See Comments)    Change in level of consciousness  . Influenza Vaccine Live     Sever flu symptoms, chest tightness, wheezing  . Latex Rash  . Vitamin B12     Swelling in extremity and general malize.    Family History  Problem Relation Age of Onset  . Heart disease Mother   . Hyperlipidemia Mother   . Stroke Mother   . Hypertension Mother     Social History   Social History  . Marital Status: Married    Spouse Name: N/A  . Number of Children: N/A  . Years of Education: N/A   Occupational History  . RN    Social History Main Topics  . Smoking status: Never Smoker   . Smokeless tobacco: Never Used  . Alcohol Use: No  . Drug Use: No  . Sexual Activity: Yes   Other Topics Concern  . None   Social History Narrative   Review of Systems - See HPI.  All other  ROS are negative.  BP 160/58 mmHg  Pulse 82  Temp(Src) 98 F (36.7 C) (Oral)  Ht 5\' 5"  (1.651 m)  Wt 179 lb (81.194 kg)  BMI 29.79 kg/m2  SpO2 98%  Physical Exam  Constitutional: She is oriented to person, place, and time and well-developed, well-nourished, and in no distress.  HENT:  Head: Normocephalic and atraumatic.  Right Ear: Tympanic membrane normal.  Left Ear: Tympanic membrane normal.  Nose: Right sinus exhibits maxillary sinus tenderness and frontal sinus tenderness.  Mouth/Throat: Uvula is midline, oropharynx is clear and moist and mucous membranes are normal.  Eyes: Conjunctivae are normal.  Neck: Neck supple.  Cardiovascular: Normal rate, regular rhythm, normal heart sounds and intact distal pulses.   Pulmonary/Chest: Effort normal and breath sounds normal.  No respiratory distress. She has no wheezes. She has no rales. She exhibits no tenderness.  Neurological: She is alert and oriented to person, place, and time.  Skin: Skin is warm and dry. No rash noted.  Psychiatric: Affect normal.  Vitals reviewed.  Recent Results (from the past 2160 hour(s))  HM DIABETES EYE EXAM     Status: None   Collection Time: 03/18/15 12:00 AM  Result Value Ref Range   HM Diabetic Eye Exam No Retinopathy No Retinopathy   Assessment/Plan: Recurrent sinusitis Recently on Augmentin. Will begin Doxycycline for a 10-day course. Continue Flonase and antihistamine. Supportive measures reviewed. Follow-up PRN if symptoms are not resolving.

## 2015-05-12 NOTE — Patient Instructions (Signed)
Please take antibiotic as directed.  Increase fluid intake.  Use Saline nasal spray.  Take a daily multivitamin. Continue Flonase and antihistaming.  Place a humidifier in the bedroom.  Please call or return clinic if symptoms are not improving. We may need to set you up with ENT if this becomes a recurring issue.  Sinusitis Sinusitis is redness, soreness, and swelling (inflammation) of the paranasal sinuses. Paranasal sinuses are air pockets within the bones of your face (beneath the eyes, the middle of the forehead, or above the eyes). In healthy paranasal sinuses, mucus is able to drain out, and air is able to circulate through them by way of your nose. However, when your paranasal sinuses are inflamed, mucus and air can become trapped. This can allow bacteria and other germs to grow and cause infection. Sinusitis can develop quickly and last only a short time (acute) or continue over a long period (chronic). Sinusitis that lasts for more than 12 weeks is considered chronic.  CAUSES  Causes of sinusitis include:  Allergies.  Structural abnormalities, such as displacement of the cartilage that separates your nostrils (deviated septum), which can decrease the air flow through your nose and sinuses and affect sinus drainage.  Functional abnormalities, such as when the small hairs (cilia) that line your sinuses and help remove mucus do not work properly or are not present. SYMPTOMS  Symptoms of acute and chronic sinusitis are the same. The primary symptoms are pain and pressure around the affected sinuses. Other symptoms include:  Upper toothache.  Earache.  Headache.  Bad breath.  Decreased sense of smell and taste.  A cough, which worsens when you are lying flat.  Fatigue.  Fever.  Thick drainage from your nose, which often is green and may contain pus (purulent).  Swelling and warmth over the affected sinuses. DIAGNOSIS  Your caregiver will perform a physical exam. During the  exam, your caregiver may:  Look in your nose for signs of abnormal growths in your nostrils (nasal polyps).  Tap over the affected sinus to check for signs of infection.  View the inside of your sinuses (endoscopy) with a special imaging device with a light attached (endoscope), which is inserted into your sinuses. If your caregiver suspects that you have chronic sinusitis, one or more of the following tests may be recommended:  Allergy tests.  Nasal culture A sample of mucus is taken from your nose and sent to a lab and screened for bacteria.  Nasal cytology A sample of mucus is taken from your nose and examined by your caregiver to determine if your sinusitis is related to an allergy. TREATMENT  Most cases of acute sinusitis are related to a viral infection and will resolve on their own within 10 days. Sometimes medicines are prescribed to help relieve symptoms (pain medicine, decongestants, nasal steroid sprays, or saline sprays).  However, for sinusitis related to a bacterial infection, your caregiver will prescribe antibiotic medicines. These are medicines that will help kill the bacteria causing the infection.  Rarely, sinusitis is caused by a fungal infection. In theses cases, your caregiver will prescribe antifungal medicine. For some cases of chronic sinusitis, surgery is needed. Generally, these are cases in which sinusitis recurs more than 3 times per year, despite other treatments. HOME CARE INSTRUCTIONS   Drink plenty of water. Water helps thin the mucus so your sinuses can drain more easily.  Use a humidifier.  Inhale steam 3 to 4 times a day (for example, sit in the bathroom  with the shower running).  Apply a warm, moist washcloth to your face 3 to 4 times a day, or as directed by your caregiver.  Use saline nasal sprays to help moisten and clean your sinuses.  Take over-the-counter or prescription medicines for pain, discomfort, or fever only as directed by your  caregiver. SEEK IMMEDIATE MEDICAL CARE IF:  You have increasing pain or severe headaches.  You have nausea, vomiting, or drowsiness.  You have swelling around your face.  You have vision problems.  You have a stiff neck.  You have difficulty breathing. MAKE SURE YOU:   Understand these instructions.  Will watch your condition.  Will get help right away if you are not doing well or get worse. Document Released: 02/01/2005 Document Revised: 04/26/2011 Document Reviewed: 02/16/2011 [facility] Patient Information 2014 [place], [place].

## 2015-05-19 MED FILL — FLUTICASONE PROP 50 MCG SPR: 50 | 30 days supply | Qty: 16 | Fill #1

## 2015-05-22 ENCOUNTER — Encounter: Payer: Self-pay | Admitting: Family Medicine

## 2015-05-22 ENCOUNTER — Ambulatory Visit (INDEPENDENT_AMBULATORY_CARE_PROVIDER_SITE_OTHER): Payer: 59 | Admitting: Family Medicine

## 2015-05-22 VITALS — BP 132/78 | HR 81 | Temp 98.0°F | Wt 180.6 lb

## 2015-05-22 DIAGNOSIS — J0140 Acute pansinusitis, unspecified: Secondary | ICD-10-CM

## 2015-05-22 MED ORDER — AMOXICILLIN-POT CLAVULANATE ER 1000-62.5 MG PO TB12: 2.0000 | ORAL_TABLET | Freq: Two times a day (BID) | ORAL | Status: DC

## 2015-05-22 MED FILL — AMOXICILLIN-CLAV ER 1,000-6: 1000-62.5 | 10 days supply | Qty: 40 | Fill #0

## 2015-05-22 NOTE — Patient Instructions (Signed)

## 2015-05-22 NOTE — Progress Notes (Signed)
Pre visit review using our clinic review tool, if applicable. No additional management support is needed unless otherwise documented below in the visit note. 

## 2015-05-24 NOTE — Progress Notes (Signed)
Patient ID: Michelle [name], female    DOB: [birth year]  Age: 60 y.o. MRN: [id number]    Subjective:  Subjective HPI CNIYA FIGGERS presents for sinus congestion, no fevers.  + sinus pressure and nasal congestion.  + cough.  No otc meds  Review of Systems  Constitutional: Positive for chills. Negative for fever.  HENT: Positive for congestion, postnasal drip, rhinorrhea and sinus pressure.   Respiratory: Positive for cough, chest tightness, shortness of breath and wheezing.   Cardiovascular: Negative for chest pain, palpitations and leg swelling.  Allergic/Immunologic: Negative for environmental allergies.    History Past Medical History  Diagnosis Date  . Asthma   . Hyperlipidemia   . Hypertension   . Thyroid disease   . Obesity   . Diabetes mellitus without complication ([place])   . Bronchitis   . Allergy     She has past surgical history that includes Tonsillectomy and adenoidectomy (childhood); Tubal ligation; and Vaginal hysterectomy (2011).   Her family history includes Heart disease in her mother; Hyperlipidemia in her mother; Hypertension in her mother; Stroke in her mother.She reports that she has never smoked. She has never used smokeless tobacco. She reports that she does not drink alcohol or use illicit drugs.  Current Outpatient Prescriptions on File Prior to Visit  Medication Sig Dispense Refill  . albuterol (PROVENTIL HFA;VENTOLIN HFA) 108 (90 BASE) MCG/ACT inhaler INhale 1 puffs into lungs every 8 hours prn wheezing or SOB 1 Inhaler 0  . ALPHA LIPOIC ACID PO Take 1-2 capsules by mouth 2 (two) times daily.      . AMBULATORY NON FORMULARY MEDICATION Medication Name: Biest/Progesterone/Testosterone/DHEA 2.5/250/0.5/2mg     . aspirin 81 MG EC tablet Take 81 mg by mouth daily. Swallow whole.     . budesonide-formoterol (SYMBICORT) 80-4.5 MCG/ACT inhaler 2 puffs bid (Patient taking differently: Inhale 1 puff into the lungs 2 (two) times daily. ) 1 Inhaler 11  .  Calcium-Magnesium (CAL-MAG PO) Take 1 tablet by mouth daily.    . Cholecalciferol (VITAMIN D3) 5000 UNITS CAPS Take 1 capsule by mouth daily.    . famotidine (PEPCID) 20 MG tablet One at bedtime 90 tablet 3  . fluticasone (FLONASE) 50 MCG/ACT nasal spray Place 2 sprays into both nostrils daily. 16 g 6  . metFORMIN (GLUCOPHAGE-XR) 500 MG 24 hr tablet TAKE 1 TABLET (500 MG TOTAL) BY MOUTH 2 TIMES A DAY. 180 tablet 1  . MULTIPLE VITAMIN PO Take 1 tablet by mouth daily.      . pantoprazole (PROTONIX) 40 MG tablet Take 1 tablet (40 mg total) by mouth daily. Take 30-60 min before first meal of the day 90 tablet 3  . PROBIOTIC CAPS Take 1 capsule by mouth daily.      . simvastatin (ZOCOR) 20 MG tablet TAKE 1 TABLET (20 MG) BY MOUTH DAILY. 90 tablet 1   No current facility-administered medications on file prior to visit.     Objective:  Objective Physical Exam  Constitutional: She is oriented to person, place, and time. She appears well-developed and well-nourished.  HENT:  Right Ear: External ear normal.  Left Ear: External ear normal.  Nose: Right sinus exhibits maxillary sinus tenderness and frontal sinus tenderness. Left sinus exhibits maxillary sinus tenderness and frontal sinus tenderness.  Mouth/Throat: Posterior oropharyngeal erythema present. No oropharyngeal exudate or posterior oropharyngeal edema.  + PND + errythema  Eyes: Conjunctivae are normal. Right eye exhibits no discharge. Left eye exhibits no discharge.  Cardiovascular: Normal rate, regular rhythm  and normal heart sounds.   No murmur heard. Pulmonary/Chest: Effort normal and breath sounds normal. No respiratory distress. She has no wheezes. She has no rales. She exhibits no tenderness.  Musculoskeletal: She exhibits no edema.  Lymphadenopathy:    She has cervical adenopathy.  Neurological: She is alert and oriented to person, place, and time.  Nursing note and vitals reviewed.  BP 132/78 mmHg  Pulse 81  Temp(Src) 98 F  (36.7 C) (Oral)  Wt 180 lb 9.6 oz (81.92 kg)  SpO2 98% Wt Readings from Last 3 Encounters:  05/22/15 180 lb 9.6 oz (81.92 kg)  05/12/15 179 lb (81.194 kg)  04/24/15 179 lb (81.194 kg)     Lab Results  Component Value Date   WBC 8.3 11/19/2014   HGB 13.6 11/19/2014   HCT 41.5 11/19/2014   PLT 266.0 11/19/2014   GLUCOSE 92 01/29/2015   CHOL 141 01/29/2015   TRIG 81.0 01/29/2015   HDL 47.40 01/29/2015   LDLCALC 77 01/29/2015   ALT 19 01/29/2015   AST 16 01/29/2015   NA 140 01/29/2015   K 4.5 01/29/2015   CL 105 01/29/2015   CREATININE 0.71 01/29/2015   BUN 15 01/29/2015   CO2 28 01/29/2015   TSH 2.703 10/01/2014   HGBA1C 5.7 01/29/2015    Dg Bone Density  03/31/2015  EXAM: DUAL X-RAY ABSORPTIOMETRY (DXA) FOR BONE MINERAL DENSITY IMPRESSION: Referring Physician:  Seymour Bars HAYGOOD [name] [name] Patient ID: [id number] Birth Date: 11-Nov-1955 Height: 63.5 in. Sex: Female Measured: 03/31/2015 Weight: 181.6 lbs. Indications: Caucasian, Diabetic, Family History of Osteoporosis, Follow up Osteopenia, Hysterectomy, Post-Menopausal Fractures: Humerus Treatments: Calcium, Estrogen/Hormone Therapy, Testosterone, Vitamin D ASSESSMENT: The BMD measured at AP Spine L1-L4 is 1.189 g/cm2 with a T-score of 0.0. This patient is considered normal according to [facility]) criteria. Site Region Measured Date Measured Age WHO YA BMD Classification T-score AP Spine L2-L4 03/31/2015 62.0 Normal -0.2 1.197 g/cm2 DualFemur Neck Left 1/31/2015 [age] years Normal 0.0 1.044 g/cm2 World Health Organization [facility]) criteria for post-menopausal, Caucasian Women: Normal       T-score at or above -1 SD Osteopenia   T-score between -1 and -2.5 SD Osteoporosis T-score at or below -2.5 SD RECOMMENDATION: [facility] recommends that FDA-approved medical therapies be considered in postmenopausal women and men age 30 or older with a: 1. Hip or vertebral (clinical or  morphometric) fracture. 2. T-score of < -2.5 at the spine or hip. 3. Ten-year fracture probability by FRAX of 3% or greater for hip fracture or 20% or greater for major osteoporotic fracture. All treatment decisions require clinical judgment and consideration of individual patient factors, including patient preferences, co-morbidities, previous drug use, risk factors not captured in the FRAX model (e.g. falls, vitamin D deficiency, increased bone turnover, interval significant decline in bone density) and possible under - or over-estimation of fracture risk by FRAX. All patients should ensure an adequate intake of dietary calcium (1200 mg/d) and vitamin D (800 IU daily) unless contraindicated. FOLLOW-UP: People with diagnosed cases of osteoporosis or at high risk for fracture should have regular bone mineral density tests. For patients eligible for Medicare, routine testing is allowed once every 2 years. The testing frequency can be increased to one year for patients who have rapidly progressing disease, those who are receiving or discontinuing medical therapy to restore bone mass, or have additional risk factors. I have reviewed this report and agree with the above findings. [facility] Radiology Electronically Signed  By: Rolm Baptise M.D.   On: 03/31/2015 10:27   Mm Screening Breast Tomo Bilateral  04/01/2015  CLINICAL DATA:  Screening. EXAM: DIGITAL SCREENING BILATERAL MAMMOGRAM WITH 3D TOMO WITH CAD COMPARISON:  Previous exam(s). ACR Breast Density Category b: There are scattered areas of fibroglandular density. FINDINGS: There are no findings suspicious for malignancy. Images were processed with CAD. IMPRESSION: No mammographic evidence of malignancy. A result letter of this screening mammogram will be mailed directly to the patient. RECOMMENDATION: Screening mammogram in one year. (Code:SM-B-01Y) BI-RADS CATEGORY  1: Negative. Electronically Signed   By: Claudie Revering M.D.   On: 04/01/2015 13:15       Assessment & Plan:  Plan I have discontinued Ms. Erber's doxycycline. I am also having her start on amoxicillin-clavulanate. Additionally, I am having her maintain her aspirin, MULTIPLE VITAMIN PO, Probiotic, ALPHA LIPOIC ACID PO, AMBULATORY NON FORMULARY MEDICATION, Calcium-Magnesium (CAL-MAG PO), albuterol, Vitamin D3, pantoprazole, famotidine, budesonide-formoterol, metFORMIN, simvastatin, and fluticasone.  Meds ordered this encounter  Medications  . amoxicillin-clavulanate (AUGMENTIN XR) 1000-62.5 MG 12 hr tablet    Sig: Take 2 tablets by mouth 2 (two) times daily.    Dispense:  40 tablet    Refill:  0    Problem List Items Addressed This Visit    None    Visit Diagnoses    Acute pansinusitis, recurrence not specified    -  Primary    Relevant Medications    amoxicillin-clavulanate (AUGMENTIN XR) 1000-62.5 MG 12 hr tablet     otc steroid nasal spray and antihistamine rto prn Follow-up: No Follow-up on file.  [name], DO

## 2015-06-02 MED FILL — SIMVASTATIN 20 MG TABLET: 20 | 90 days supply | Qty: 90 | Fill #1

## 2015-06-09 ENCOUNTER — Ambulatory Visit (INDEPENDENT_AMBULATORY_CARE_PROVIDER_SITE_OTHER): Payer: 59 | Admitting: Family Medicine

## 2015-06-09 ENCOUNTER — Encounter: Payer: Self-pay | Admitting: Family Medicine

## 2015-06-09 VITALS — BP 130/80 | HR 86 | Temp 98.7°F | Ht 65.0 in | Wt 180.0 lb

## 2015-06-09 DIAGNOSIS — J0141 Acute recurrent pansinusitis: Secondary | ICD-10-CM

## 2015-06-09 MED ORDER — CLINDAMYCIN HCL 150 MG PO CAPS: 150.0000 mg | ORAL_CAPSULE | Freq: Four times a day (QID) | ORAL | Status: DC

## 2015-06-09 MED FILL — CLINDAMYCIN HCL 150 MG CAP: 150 | 10 days supply | Qty: 40 | Fill #0

## 2015-06-09 NOTE — Progress Notes (Signed)
Patient ID: Michelle Michelle [name] Michelle [name], female    DOB: [birth year]  Age: 60 y.o. MRN: [id number]    Subjective:  Subjective HPI Michelle Michelle [name] Michelle [name] presents for persistant sinusiits.  She started to get better after last visit but after she finished abx symptoms started to come back.  No fever, but she feels flushed.    Review of Systems  Constitutional: Positive for chills. Negative for fever.  HENT: Positive for congestion, postnasal drip, rhinorrhea and sinus pressure.   Respiratory: Negative for cough, chest tightness, shortness of breath and wheezing.   Cardiovascular: Negative for chest pain, palpitations and leg swelling.  Allergic/Immunologic: Negative for environmental allergies.    History Past Medical History  Diagnosis Date  . Asthma   . Hyperlipidemia   . Hypertension   . Thyroid disease   . Obesity   . Diabetes mellitus without complication ([place])   . Bronchitis   . Allergy     She has past surgical history that includes Tonsillectomy and adenoidectomy (childhood); Tubal ligation; and Vaginal hysterectomy (2011).   Her family history includes Heart disease in her mother; Hyperlipidemia in her mother; Hypertension in her mother; Stroke in her mother.She reports that she has never smoked. She has never used smokeless tobacco. She reports that she does not drink alcohol or use illicit drugs.  Current Outpatient Prescriptions on File Prior to Visit  Medication Sig Dispense Refill  . albuterol (PROVENTIL HFA;VENTOLIN HFA) 108 (90 BASE) MCG/ACT inhaler INhale 1 puffs into lungs every 8 hours prn wheezing or SOB 1 Inhaler 0  . ALPHA LIPOIC ACID PO Take 1-2 capsules by mouth 2 (two) times daily.      . AMBULATORY NON FORMULARY MEDICATION Medication Name: Biest/Progesterone/Testosterone/DHEA 2.5/250/0.5/2mg     . aspirin 81 MG EC tablet Take 81 mg by mouth daily. Swallow whole.     . budesonide-formoterol (SYMBICORT) 80-4.5 MCG/ACT inhaler 2 puffs bid (Patient taking differently: Inhale  1 puff into the lungs 2 (two) times daily. ) 1 Inhaler 11  . Calcium-Magnesium (CAL-MAG PO) Take 1 tablet by mouth daily.    . Cholecalciferol (VITAMIN D3) 5000 UNITS CAPS Take 1 capsule by mouth daily.    . famotidine (PEPCID) 20 MG tablet One at bedtime 90 tablet 3  . fluticasone (FLONASE) 50 MCG/ACT nasal spray Place 2 sprays into both nostrils daily. 16 g 6  . metFORMIN (GLUCOPHAGE-XR) 500 MG 24 hr tablet TAKE 1 TABLET (500 MG TOTAL) BY MOUTH 2 TIMES A DAY. 180 tablet 1  . MULTIPLE VITAMIN PO Take 1 tablet by mouth daily.      . pantoprazole (PROTONIX) 40 MG tablet Take 1 tablet (40 mg total) by mouth daily. Take 30-60 min before first meal of the day 90 tablet 3  . PROBIOTIC CAPS Take 1 capsule by mouth daily.      . simvastatin (ZOCOR) 20 MG tablet TAKE 1 TABLET (20 MG) BY MOUTH DAILY. 90 tablet 1   No current facility-administered medications on file prior to visit.     Objective:  Objective Physical Exam  Constitutional: She is oriented to person, place, and time. She appears well-developed and well-nourished.  HENT:  Right Ear: External ear normal.  Left Ear: External ear normal.  Nose: Right sinus exhibits maxillary sinus tenderness and frontal sinus tenderness. Left sinus exhibits no maxillary sinus tenderness and no frontal sinus tenderness.  + PND + errythema  Eyes: Conjunctivae are normal. Right eye exhibits no discharge. Left eye exhibits no discharge.  Cardiovascular: Normal rate, regular  rhythm and normal heart sounds.   No murmur heard. Pulmonary/Chest: Effort normal and breath sounds normal. No respiratory distress. She has no wheezes. She has no rales. She exhibits no tenderness.  Musculoskeletal: She exhibits no edema.  Lymphadenopathy:    She has cervical adenopathy.  Neurological: She is alert and oriented to person, place, and time.  Psychiatric: She has a normal mood and affect. Her behavior is normal. Judgment and thought content normal.  Nursing note and  vitals reviewed.  BP 130/80 mmHg  Pulse 86  Temp(Src) 98.7 F (37.1 C) (Oral)  Ht 5\' 5"  (1.651 m)  Wt 180 lb (81.647 kg)  BMI 29.95 kg/m2  SpO2 98% Wt Readings from Last 3 Encounters:  06/09/15 180 lb (81.647 kg)  05/22/15 180 lb 9.6 oz (81.92 kg)  05/12/15 179 lb (81.194 kg)     Lab Results  Component Value Date   WBC 8.3 11/19/2014   HGB 13.6 11/19/2014   HCT 41.5 11/19/2014   PLT 266.0 11/19/2014   GLUCOSE 92 01/29/2015   CHOL 141 01/29/2015   TRIG 81.0 01/29/2015   HDL 47.40 01/29/2015   LDLCALC 77 01/29/2015   ALT 19 01/29/2015   AST 16 01/29/2015   NA 140 01/29/2015   K 4.5 01/29/2015   CL 105 01/29/2015   CREATININE 0.71 01/29/2015   BUN 15 01/29/2015   CO2 28 01/29/2015   TSH 2.703 10/01/2014   HGBA1C 5.7 01/29/2015    Dg Bone Density  03/31/2015  EXAM: DUAL X-RAY ABSORPTIOMETRY (DXA) FOR BONE MINERAL DENSITY IMPRESSION: Referring Physician:  Seymour Bars HAYGOOD [name] Michelle [name], Michelle [name] Patient ID: [id number] Birth Date: 12-04-1955 Height: 63.5 in. Sex: Female Measured: 03/31/2015 Weight: 181.6 lbs. Indications: Caucasian, Diabetic, Family History of Osteoporosis, Follow up Osteopenia, Hysterectomy, Post-Menopausal Fractures: Humerus Treatments: Calcium, Estrogen/Hormone Therapy, Testosterone, Vitamin D ASSESSMENT: The BMD measured at AP Spine L1-L4 is 1.189 g/cm2 with a T-score of 0.0. This patient is considered normal according to [facility]) criteria. Site Region Measured Date Measured Age WHO YA BMD Classification T-score AP Spine L2-L4 03/31/2015 60.0 Normal -0.2 1.197 g/cm2 DualFemur Neck Left 1/31/2015 [age] Normal 0.0 1.044 g/cm2 World Health Organization [facility]) criteria for post-menopausal, Caucasian Women: Normal       T-score at or above -1 SD Osteopenia   T-score between -1 and -2.5 SD Osteoporosis T-score at or below -2.5 SD RECOMMENDATION: [place] recommends that FDA-approved medical therapies be  considered in postmenopausal women and men age 18 or older with a: 1. Hip or vertebral (clinical or morphometric) fracture. 2. T-score of < -2.5 at the spine or hip. 3. Ten-year fracture probability by FRAX of 3% or greater for hip fracture or 20% or greater for major osteoporotic fracture. All treatment decisions require clinical judgment and consideration of individual patient factors, including patient preferences, co-morbidities, previous drug use, risk factors not captured in the FRAX model (e.g. falls, vitamin D deficiency, increased bone turnover, interval significant decline in bone density) and possible under - or over-estimation of fracture risk by FRAX. All patients should ensure an adequate intake of dietary calcium (1200 mg/d) and vitamin D (800 IU daily) unless contraindicated. FOLLOW-UP: People with diagnosed cases of osteoporosis or at high risk for fracture should have regular bone mineral density tests. For patients eligible for Medicare, routine testing is allowed once every 2 years. The testing frequency can be increased to one year for patients who have rapidly progressing disease, those who are receiving or discontinuing  medical therapy to restore bone mass, or have additional risk factors. I have reviewed this report and agree with the above findings. [facility] Radiology Electronically Signed   By: Rolm Baptise M.D.   On: 03/31/2015 10:27   Mm Screening Breast Tomo Bilateral  04/01/2015  CLINICAL DATA:  Screening. EXAM: DIGITAL SCREENING BILATERAL MAMMOGRAM WITH 3D TOMO WITH CAD COMPARISON:  Previous exam(s). ACR Breast Density Category b: There are scattered areas of fibroglandular density. FINDINGS: There are no findings suspicious for malignancy. Images were processed with CAD. IMPRESSION: No mammographic evidence of malignancy. A result letter of this screening mammogram will be mailed directly to the patient. RECOMMENDATION: Screening mammogram in one year. (Code:SM-B-01Y) BI-RADS  CATEGORY  1: Negative. Electronically Signed   By: Claudie Revering M.D.   On: 04/01/2015 13:15     Assessment & Plan:  Plan I have discontinued Ms. Schnieders's amoxicillin-clavulanate. I am also having her start on clindamycin. Additionally, I am having her maintain her aspirin, MULTIPLE VITAMIN PO, Probiotic, ALPHA LIPOIC ACID PO, AMBULATORY NON FORMULARY MEDICATION, Calcium-Magnesium (CAL-MAG PO), albuterol, Vitamin D3, pantoprazole, famotidine, budesonide-formoterol, metFORMIN, simvastatin, and fluticasone.  Meds ordered this encounter  Medications  . clindamycin (CLEOCIN) 150 MG capsule    Sig: Take 1 capsule (150 mg total) by mouth 4 (four) times daily.    Dispense:  40 capsule    Refill:  0    Problem List Items Addressed This Visit    None    Visit Diagnoses    Acute recurrent pansinusitis    -  Primary    Relevant Medications    clindamycin (CLEOCIN) 150 MG capsule     con't flonase,  Can use otc antihistamine rto prn  Follow-up: Return if symptoms worsen or fail to improve.  [name], DO

## 2015-06-09 NOTE — Progress Notes (Signed)
Pre visit review using our clinic review tool, if applicable. No additional management support is needed unless otherwise documented below in the visit note. 

## 2015-06-09 NOTE — Patient Instructions (Signed)

## 2015-06-18 MED FILL — PANTOPRAZOLE SOD DR 40 MG T: 40 | 90 days supply | Qty: 90 | Fill #2

## 2015-06-18 MED FILL — FLUTICASONE PROP 50 MCG SPR: 50 | 30 days supply | Qty: 16 | Fill #2

## 2015-06-18 MED FILL — SYMBICORT 80-4.5 MCG INH: 80-4.5 | 30 days supply | Qty: 10 | Fill #3

## 2015-07-15 ENCOUNTER — Other Ambulatory Visit: Payer: Self-pay | Admitting: Family Medicine

## 2015-07-15 MED FILL — METFORMIN HCL ER 500 MG TAB: 500 | 90 days supply | Qty: 180 | Fill #0

## 2015-07-17 MED FILL — FAMOTIDINE 20 MG TABLET: 20 | 90 days supply | Qty: 90 | Fill #2

## 2015-07-21 DIAGNOSIS — S034XXA Sprain of jaw, initial encounter: Secondary | ICD-10-CM | POA: Diagnosis not present

## 2015-07-31 ENCOUNTER — Telehealth: Payer: Self-pay | Admitting: Family Medicine

## 2015-07-31 DIAGNOSIS — J0191 Acute recurrent sinusitis, unspecified: Secondary | ICD-10-CM

## 2015-07-31 MED FILL — SYMBICORT 80-4.5 MCG INH: 80-4.5 | 30 days supply | Qty: 10 | Fill #4

## 2015-07-31 MED FILL — FLUTICASONE PROP 50 MCG SPR: 50 | 30 days supply | Qty: 16 | Fill #3

## 2015-07-31 NOTE — Telephone Encounter (Signed)
Pt called in to request a referral to ENT, she says that her oral surgeon confirmed that it is definitely needed. (already previously discussed with PCP) . Provided pt with common referral locations from our referral dept. Pt says that she will look into those locations and call our office back.

## 2015-07-31 NOTE — Telephone Encounter (Signed)
Ok to refer to ENT for recurrent sinusitis

## 2015-07-31 NOTE — Telephone Encounter (Signed)
Please advise if it is ok to place the referral.    KP

## 2015-08-01 NOTE — Telephone Encounter (Signed)
[facility] - ENT - [address]- [phone]

## 2015-08-01 NOTE — Telephone Encounter (Signed)
Ref has been placed.     KP 

## 2015-08-07 DIAGNOSIS — J342 Deviated nasal septum: Secondary | ICD-10-CM | POA: Diagnosis not present

## 2015-08-27 ENCOUNTER — Other Ambulatory Visit: Payer: Self-pay | Admitting: Family Medicine

## 2015-08-28 MED FILL — SIMVASTATIN 20 MG TABLET: 20 | 90 days supply | Qty: 90 | Fill #0

## 2015-09-01 MED FILL — FLUTICASONE PROP 50 MCG SPR: 50 | 30 days supply | Qty: 16 | Fill #4

## 2015-09-25 MED FILL — PANTOPRAZOLE SOD DR 40 MG T: 40 | 90 days supply | Qty: 90 | Fill #3

## 2015-09-25 MED FILL — FAMOTIDINE 20 MG TABLET: 20 | 90 days supply | Qty: 90 | Fill #3

## 2015-10-08 MED FILL — SYMBICORT 80-4.5 MCG INH: 80-4.5 | 30 days supply | Qty: 10 | Fill #5

## 2015-10-08 MED FILL — FLUTICASONE PROP 50 MCG SPR: 50 | 30 days supply | Qty: 16 | Fill #5

## 2015-10-15 ENCOUNTER — Other Ambulatory Visit: Payer: Self-pay | Admitting: Family Medicine

## 2015-10-15 MED FILL — METFORMIN HCL ER 500 MG TAB: 500 | 90 days supply | Qty: 180 | Fill #0

## 2015-11-24 MED FILL — SIMVASTATIN 20 MG TABLET: 20 | 90 days supply | Qty: 90 | Fill #1

## 2015-11-24 MED FILL — SYMBICORT 80-4.5 MCG INH: 80-4.5 | 30 days supply | Qty: 10 | Fill #6

## 2015-12-01 MED FILL — FLUTICASONE PROP 50 MCG SPR: 50 | 30 days supply | Qty: 16 | Fill #6

## 2015-12-15 ENCOUNTER — Other Ambulatory Visit: Payer: Self-pay | Admitting: Family Medicine

## 2015-12-15 DIAGNOSIS — R0600 Dyspnea, unspecified: Secondary | ICD-10-CM

## 2015-12-15 MED FILL — PANTOPRAZOLE SOD DR 40 MG T: 40 | 90 days supply | Qty: 90 | Fill #0

## 2015-12-15 MED FILL — FAMOTIDINE 20 MG TABLET: 20 | 90 days supply | Qty: 90 | Fill #0

## 2015-12-15 NOTE — Telephone Encounter (Signed)
Patient needs follow up appt. Thanks PC

## 2015-12-31 NOTE — Telephone Encounter (Signed)
Called pt. lvm advising to schedule  F/U with pcp

## 2016-01-06 ENCOUNTER — Other Ambulatory Visit: Payer: Self-pay | Admitting: Family Medicine

## 2016-01-06 DIAGNOSIS — J0140 Acute pansinusitis, unspecified: Secondary | ICD-10-CM

## 2016-01-06 MED FILL — FLUTICASONE PROP 50 MCG SPR: 50 | 30 days supply | Qty: 16 | Fill #0

## 2016-01-12 MED FILL — METFORMIN HCL ER 500 MG TAB: 500 | 90 days supply | Qty: 180 | Fill #1

## 2016-01-19 ENCOUNTER — Other Ambulatory Visit: Payer: Self-pay | Admitting: Family Medicine

## 2016-01-19 DIAGNOSIS — Z8709 Personal history of other diseases of the respiratory system: Secondary | ICD-10-CM

## 2016-01-19 MED FILL — SYMBICORT 80-4.5 MCG INH: 80-4.5 | 30 days supply | Qty: 10 | Fill #0

## 2016-01-19 NOTE — Telephone Encounter (Signed)
eScribe request from [place] for refill on Symbicort HFA Last filled - 01/02/15, #1 [id number] refills Last AEX - 01/28/15 [Has had Acute visits after F/U] Next AEX - 3-Mths.Refill sent per [facility] refill protocol/SLS  Please call patient and schedule past due F/U office visit prior to future refills/SLS 12/04

## 2016-01-20 ENCOUNTER — Ambulatory Visit: Payer: 59 | Admitting: Family Medicine

## 2016-02-02 MED FILL — PANTOPRAZOLE SOD DR 20 MG T: 20 | 30 days supply | Qty: 30 | Fill #0

## 2016-02-08 ENCOUNTER — Encounter (HOSPITAL_BASED_OUTPATIENT_CLINIC_OR_DEPARTMENT_OTHER): Payer: Self-pay | Admitting: *Deleted

## 2016-02-08 ENCOUNTER — Emergency Department (HOSPITAL_BASED_OUTPATIENT_CLINIC_OR_DEPARTMENT_OTHER)
Admission: EM | Admit: 2016-02-08 | Discharge: 2016-02-08 | Disposition: A | Discharge status: Home / Self Care | Payer: 59 | Attending: Emergency Medicine | Admitting: Emergency Medicine

## 2016-02-08 ENCOUNTER — Emergency Department (HOSPITAL_BASED_OUTPATIENT_CLINIC_OR_DEPARTMENT_OTHER): Payer: 59

## 2016-02-08 DIAGNOSIS — Z79899 Other long term (current) drug therapy: Secondary | ICD-10-CM | POA: Diagnosis not present

## 2016-02-08 DIAGNOSIS — R109 Unspecified abdominal pain: Secondary | ICD-10-CM | POA: Diagnosis not present

## 2016-02-08 DIAGNOSIS — E039 Hypothyroidism, unspecified: Secondary | ICD-10-CM | POA: Diagnosis not present

## 2016-02-08 DIAGNOSIS — I10 Essential (primary) hypertension: Secondary | ICD-10-CM | POA: Diagnosis not present

## 2016-02-08 DIAGNOSIS — R319 Hematuria, unspecified: Secondary | ICD-10-CM | POA: Diagnosis not present

## 2016-02-08 DIAGNOSIS — Z7982 Long term (current) use of aspirin: Secondary | ICD-10-CM | POA: Diagnosis not present

## 2016-02-08 DIAGNOSIS — E119 Type 2 diabetes mellitus without complications: Secondary | ICD-10-CM | POA: Insufficient documentation

## 2016-02-08 DIAGNOSIS — Z91040 Latex allergy status: Secondary | ICD-10-CM | POA: Diagnosis not present

## 2016-02-08 DIAGNOSIS — N12 Tubulo-interstitial nephritis, not specified as acute or chronic: Secondary | ICD-10-CM

## 2016-02-08 DIAGNOSIS — J45909 Unspecified asthma, uncomplicated: Secondary | ICD-10-CM | POA: Diagnosis not present

## 2016-02-08 HISTORY — DX: Calculus of kidney: N20.0

## 2016-02-08 LAB — URINALYSIS, ROUTINE W REFLEX MICROSCOPIC
Bilirubin Urine: NEGATIVE
Glucose, UA: NEGATIVE mg/dL
Ketones, ur: NEGATIVE mg/dL
Nitrite: POSITIVE — AB
Protein, ur: 100 mg/dL — AB
Specific Gravity, Urine: 1.01 (ref 1.005–1.030)
pH: 6.5 (ref 5.0–8.0)

## 2016-02-08 LAB — URINALYSIS, MICROSCOPIC (REFLEX)

## 2016-02-08 LAB — CBC WITH DIFFERENTIAL/PLATELET
Band Neutrophils: 9 %
Basophils Absolute: 0 10*3/uL (ref 0.0–0.1)
Basophils Relative: 0 %
Blasts: 0 %
Eosinophils Absolute: 0.2 10*3/uL (ref 0.0–0.7)
Eosinophils Relative: 1 %
HCT: 38.2 % (ref 36.0–46.0)
Hemoglobin: 12.8 g/dL (ref 12.0–15.0)
Lymphocytes Relative: 7 %
Lymphs Abs: 1.6 10*3/uL (ref 0.7–4.0)
MCH: 29.5 pg (ref 26.0–34.0)
MCHC: 33.5 g/dL (ref 30.0–36.0)
MCV: 88 fL (ref 78.0–100.0)
Metamyelocytes Relative: 0 %
Monocytes Absolute: 1.6 10*3/uL — ABNORMAL HIGH (ref 0.1–1.0)
Monocytes Relative: 7 %
Myelocytes: 0 %
Neutro Abs: 19.2 10*3/uL — ABNORMAL HIGH (ref 1.7–7.7)
Neutrophils Relative %: 76 %
Platelets: 243 10*3/uL (ref 150–400)
Promyelocytes Absolute: 0 %
RBC: 4.34 MIL/uL (ref 3.87–5.11)
RDW: 12.7 % (ref 11.5–15.5)
WBC: 22.6 10*3/uL — ABNORMAL HIGH (ref 4.0–10.5)
nRBC: 0 /100 WBC

## 2016-02-08 LAB — BASIC METABOLIC PANEL
Anion gap: 10 (ref 5–15)
BUN: 13 mg/dL (ref 6–20)
CO2: 23 mmol/L (ref 22–32)
Calcium: 9.3 mg/dL (ref 8.9–10.3)
Chloride: 100 mmol/L — ABNORMAL LOW (ref 101–111)
Creatinine, Ser: 1 mg/dL (ref 0.44–1.00)
GFR calc Af Amer: >60 mL/min (ref >60)
GFR calc non Af Amer: 59 mL/min — ABNORMAL LOW (ref >60)
Glucose, Bld: 168 mg/dL — ABNORMAL HIGH (ref 65–99)
Potassium: 3.8 mmol/L (ref 3.5–5.1)
Sodium: 133 mmol/L — ABNORMAL LOW (ref 135–145)

## 2016-02-08 MED ORDER — SODIUM CHLORIDE 0.9 % IV BOLUS (SEPSIS)
1000.0000 mL | Freq: Once | INTRAVENOUS | Status: AC
Start: 2016-02-08 — End: 2016-02-08
  Administered 2016-02-08: 1000 mL via INTRAVENOUS

## 2016-02-08 MED ORDER — PROMETHAZINE HCL 25 MG PO TABS: 25.0000 mg | ORAL_TABLET | Freq: Three times a day (TID) | ORAL | 0 refills | Status: DC | PRN

## 2016-02-08 MED ORDER — ONDANSETRON 8 MG PO TBDP
8.0000 mg | ORAL_TABLET | Freq: Once | ORAL | Status: AC
  Administered 2016-02-08: 8 mg via ORAL
  Filled 2016-02-08: qty 1

## 2016-02-08 MED ORDER — CIPROFLOXACIN HCL 500 MG PO TABS: 500.0000 mg | ORAL_TABLET | Freq: Two times a day (BID) | ORAL | 0 refills | Status: DC

## 2016-02-08 MED ORDER — CIPROFLOXACIN IN D5W 400 MG/200ML IV SOLN
400.0000 mg | Freq: Once | INTRAVENOUS | Status: AC
  Administered 2016-02-08: 400 mg via INTRAVENOUS
  Filled 2016-02-08: qty 200

## 2016-02-08 NOTE — ED Provider Notes (Signed)
[facility] DEPT [facility] Provider Note   CSN: [id number] Arrival date & time: 02/08/16  0818     History   Chief Complaint Chief Complaint  Patient presents with  . Flank Pain    HPI Michelle [name] is a 60 y.o. female.  HPI Patient presents to the emergency department with flank pain that started last night.  Patient states that certain having dysuria, frequency and urgency and hematuria several days ago and then noticed that she had a low-grade temperature as well.  The patient states that she has had no vomiting, but nausea.  The patient states that nothing seems to make the condition.  Patient states that she has had a history of kidney stones and felt like this may be similar, but states that she did not have fever previously with her stones.  She states she did do a detox cleanse of her kidneys and that is when the pain started to get worse. The patient denies chest pain, shortness of breath, headache,blurred vision, neck pain, fever, cough, weakness, numbness, dizziness, anorexia, edema,vomiting, diarrhea, rash,  hematemesis, bloody stool, near syncope, or syncope. Past Medical History:  Diagnosis Date  . Allergy   . Asthma   . Bronchitis   . Diabetes mellitus without complication ([place])   . Hyperlipidemia   . Hypertension   . Kidney stones   . Obesity   . Thyroid disease     Patient Active Problem List   Diagnosis Date Noted  . Recurrent sinusitis 05/12/2015  . Upper airway cough syndrome 10/16/2014  . Obesity (BMI 30-39.9) 10/16/2014  . Dyspnea 10/15/2014  . Renal calculi  incidental on CT imaging 03/14/2014  . Borderline diabetes  DX Dr Sharol Roussel 03/11/2014  . HTN (hypertension) 03/26/2013  . Hyperlipidemia 03/26/2013  . Osteopenia 03/26/2013  . Hypothyroidism 03/26/2013  . Menopausal symptoms 02/24/2012  . Status post hysterectomy 02/24/2012    Past Surgical History:  Procedure Laterality Date  . TONSILLECTOMY AND ADENOIDECTOMY  childhood  . TUBAL  LIGATION    . VAGINAL HYSTERECTOMY  2011    OB History    Gravida Para Term Preterm AB Living   7 4     3 4    SAB TAB Ectopic Multiple Live Births   3 0             Home Medications    Prior to Admission medications   Medication Sig Start Date End Date Taking? Authorizing Provider  ALPHA LIPOIC ACID PO Take 1-2 capsules by mouth 2 (two) times daily.     Yes Historical Provider, MD  AMBULATORY NON FORMULARY MEDICATION Medication Name: Biest/Progesterone/Testosterone/DHEA 2.5/250/0.5/2mg    Yes Historical Provider, MD  aspirin 81 MG EC tablet Take 81 mg by mouth daily. Swallow whole.    Yes Historical Provider, MD  Calcium-Magnesium (CAL-MAG PO) Take 1 tablet by mouth daily.   Yes Historical Provider, MD  Cholecalciferol (VITAMIN D3) 5000 UNITS CAPS Take 1 capsule by mouth daily.   Yes Historical Provider, MD  famotidine (PEPCID) 20 MG tablet TAKE 1 TABLET BY MOUTH AT BEDTIME 12/15/15  Yes Yvonne R Lowne Chase, DO  fluticasone (FLONASE) 50 MCG/ACT nasal spray PLACE 2 SPRAYS INTO BOTH NOSTRILS DAILY. 01/06/16  Yes Yvonne R Lowne Chase, DO  metFORMIN (GLUCOPHAGE-XR) 500 MG 24 hr tablet TAKE 1 TABLET (500 MG TOTAL) BY MOUTH 2 TIMES A DAY. 10/15/15  Yes Yvonne R Lowne Chase, DO  MULTIPLE VITAMIN PO Take 1 tablet by mouth daily.     Yes Historical  Provider, MD  pantoprazole (PROTONIX) 40 MG tablet TAKE 1 TABLET (40 MG TOTAL) BY MOUTH DAILY. TAKE 30-60 MIN BEFORE FIRST MEAL OF THE DAY 12/15/15  Yes Alferd Apa Lowne Chase, DO  PROBIOTIC CAPS Take 1 capsule by mouth daily.     Yes Historical Provider, MD  simvastatin (ZOCOR) 20 MG tablet TAKE 1 TABLET (20 MG) BY MOUTH DAILY. 08/28/15  Yes Yvonne R Lowne Chase, DO  SYMBICORT 80-4.5 MCG/ACT inhaler INHALE 2 PUFFS INTO THE LUNGS TWICE A DAY 01/19/16  Yes Yvonne R Lowne Chase, DO  albuterol (PROVENTIL HFA;VENTOLIN HFA) 108 (90 BASE) MCG/ACT inhaler INhale 1 puffs into lungs every 8 hours prn wheezing or SOB 04/16/14   Lanice Shirts, MD  clindamycin  (CLEOCIN) 150 MG capsule Take 1 capsule (150 mg total) by mouth 4 (four) times daily. 06/09/15   [name], DO    Family History Family History  Problem Relation Age of Onset  . Heart disease Mother   . Hyperlipidemia Mother   . Stroke Mother   . Hypertension Mother     Social History Social History  Substance Use Topics  . Smoking status: Never Smoker  . Smokeless tobacco: Never Used  . Alcohol use No     Allergies   Shrimp [shellfish allergy]; Tdap [diphth-acell pertussis-tetanus]; Influenza vaccine live; Latex; and Vitamin b12   Review of Systems Review of Systems All other systems negative except as documented in the HPI. All pertinent positives and negatives as reviewed in the HPI.  Physical Exam Updated Vital Signs BP 132/62 (BP Location: Right Arm)   Pulse 99   Temp 98.1 F (36.7 C) (Oral)   Resp 14   Ht 5\' 3"  (1.6 m)   Wt 76.7 kg   SpO2 97%   BMI 29.94 kg/m   Physical Exam  Constitutional: She is oriented to person, place, and time. She appears well-developed and well-nourished. No distress.  HENT:  Head: Normocephalic and atraumatic.  Mouth/Throat: Oropharynx is clear and moist.  Eyes: Pupils are equal, round, and reactive to light.  Neck: Normal range of motion. Neck supple.  Cardiovascular: Normal rate, regular rhythm and normal heart sounds.  Exam reveals no gallop and no friction rub.   No murmur heard. Pulmonary/Chest: Effort normal and breath sounds normal. No respiratory distress. She has no wheezes.  Abdominal: Soft. Bowel sounds are normal. She exhibits no distension and no mass. There is tenderness. There is no guarding.  Neurological: She is alert and oriented to person, place, and time. She exhibits normal muscle tone. Coordination normal.  Skin: Skin is warm and dry. Capillary refill takes less than 2 seconds. No rash noted. No erythema.  Psychiatric: She has a normal mood and affect. Her behavior is normal.  Nursing note and  vitals reviewed.    ED Treatments / Results  Labs (all labs ordered are listed, but only abnormal results are displayed) Labs Reviewed  URINALYSIS, ROUTINE W REFLEX MICROSCOPIC - Abnormal; Notable for the following:       Result Value   APPearance CLOUDY (*)    Hgb urine dipstick SMALL (*)    Protein, ur 100 (*)    Nitrite POSITIVE (*)    Leukocytes, UA LARGE (*)    All other components within normal limits  BASIC METABOLIC PANEL - Abnormal; Notable for the following:    Sodium 133 (*)    Chloride 100 (*)    Glucose, Bld 168 (*)    GFR calc non Af  Amer 59 (*)    All other components within normal limits  CBC WITH DIFFERENTIAL/PLATELET - Abnormal; Notable for the following:    WBC 22.6 (*)    Neutro Abs 19.2 (*)    Monocytes Absolute 1.6 (*)    All other components within normal limits  URINALYSIS, MICROSCOPIC (REFLEX) - Abnormal; Notable for the following:    Bacteria, UA MANY (*)    Squamous Epithelial / LPF 0-5 (*)    All other components within normal limits  URINE CULTURE    EKG  EKG Interpretation None       Radiology Ct Renal Stone Study  Result Date: 02/08/2016 CLINICAL DATA:  Bilateral flank pain and hematuria. History of renal stones. EXAM: CT ABDOMEN AND PELVIS WITHOUT CONTRAST TECHNIQUE: Multidetector CT imaging of the abdomen and pelvis was performed following the standard protocol without IV contrast. COMPARISON:  CTA abdomen and pelvis 06/22/2007. FINDINGS: Lower chest: Mild dependent atelectasis is more notable on the left. No pleural or pericardial effusion. Hepatobiliary: There is fatty infiltration of the liver. The gallbladder is decompressed but otherwise unremarkable. Biliary tree appears normal. Pancreas: Unremarkable. No pancreatic ductal dilatation or surrounding inflammatory changes. Spleen: Normal in size without focal abnormality. Adrenals/Urinary Tract: There is some stranding about both kidneys and ureters. Walls of both the right and left  renal pelvis and ureters are prominent, more notable on the left. No ureteral or urinary bladder stones are identified on either side. The patient has approximately 5 nonobstructing left renal stones measuring up to 0.7 cm in the lower pole. There is also a 0.7 cm nonobstructing stone in the lower pole of the right kidney. Walls of the urinary bladder appear mildly thickened. Stomach/Bowel: Stomach is within normal limits. No evidence of bowel wall thickening, distention, or inflammatory changes. The appendix is not visualized and may have been removed. No evidence of appendicitis. Vascular/Lymphatic: Aortoiliac atherosclerosis without aneurysm. No lymphadenopathy. Reproductive: Status post hysterectomy. No adnexal masses. Other: Small fat containing umbilical hernia is noted. No fluid collection. Musculoskeletal: No fracture or worrisome lesion. IMPRESSION: Negative for hydronephrosis or ureteral stone in patient with bilateral nonobstructing renal stones, more numerous on the left. Stranding about both kidneys and thickening of the walls of the urinary bladder and ureters consistent with urinary tract infection. Aortoiliac atherosclerosis without aneurysm. Small fat containing umbilical hernia. Electronically Signed   By: Inge Rise M.D.   On: 02/08/2016 10:09    Procedures Procedures (including critical care time)  Medications Ordered in ED Medications  sodium chloride 0.9 % bolus 1,000 mL (0 mLs Intravenous Stopped 02/08/16 1015)  ciprofloxacin (CIPRO) IVPB 400 mg (400 mg Intravenous New Bag/Given 02/08/16 1025)     Initial Impression / Assessment and Plan / ED Course  I have reviewed the triage vital signs and the nursing notes.  Pertinent labs & imaging results that were available during my care of the patient were reviewed by me and considered in my medical decision making (see chart for details).  Clinical Course     Patient will be treated for early pyelonephritis.  The patient  has not had any vomiting.  Her vital signs normalized following IV fluids.  Patient is given IV antibiotics here in the emergency department.  Told to return here as needed.  Patient agrees the plan and all questions were answered.  Advised her to follow-up with her primary care doctor, as well  Final Clinical Impressions(s) / ED Diagnoses   Final diagnoses:  None    New  Prescriptions New Prescriptions   No medications on file     Dalia Heading, PA-C 02/08/16 [place], MD 02/13/16 [phone]

## 2016-02-08 NOTE — ED Triage Notes (Signed)
Pt reports dysuria, frequency, urgency and hematuria x4days. Reports fever (high of 100.8 -- reports taking 2 Aleve per day). Reports bil lower back pain and nausea as well. Denies v/d.

## 2016-02-08 NOTE — Discharge Instructions (Signed)
Return here as needed.  Increase your fluid intake, rest as much as possible.  Follow up with your primary care doctor °

## 2016-02-10 ENCOUNTER — Telehealth: Payer: Self-pay | Admitting: Family Medicine

## 2016-02-10 LAB — URINE CULTURE: Culture: >=100000 — AB

## 2016-02-10 NOTE — Telephone Encounter (Signed)
Pt says that she was seen in the ED at [place] over the weekend and was advised to follow up with provider. Scheduled pt to come in at providers next available appt.

## 2016-02-10 NOTE — Telephone Encounter (Signed)
Patient scheduled to see Provider 12/27 for acute issue. Medication follow up added to visit.

## 2016-02-12 ENCOUNTER — Ambulatory Visit (INDEPENDENT_AMBULATORY_CARE_PROVIDER_SITE_OTHER): Payer: 59 | Admitting: Family Medicine

## 2016-02-12 ENCOUNTER — Encounter: Payer: Self-pay | Admitting: Family Medicine

## 2016-02-12 VITALS — BP 122/58 | HR 97 | Temp 97.5°F | Resp 16

## 2016-02-12 DIAGNOSIS — N159 Renal tubulo-interstitial disease, unspecified: Secondary | ICD-10-CM

## 2016-02-12 DIAGNOSIS — R110 Nausea: Secondary | ICD-10-CM | POA: Diagnosis not present

## 2016-02-12 LAB — POC URINALSYSI DIPSTICK (AUTOMATED)
Bilirubin, UA: NEGATIVE
Blood, UA: NEGATIVE
Glucose, UA: NEGATIVE
KETONES UA: NEGATIVE
LEUKOCYTES UA: NEGATIVE
Nitrite, UA: NEGATIVE
PH UA: 6
Spec Grav, UA: 1.02
Urobilinogen, UA: NEGATIVE

## 2016-02-12 MED ORDER — ONDANSETRON HCL 4 MG PO TABS: 4.0000 mg | ORAL_TABLET | Freq: Three times a day (TID) | ORAL | 0 refills | Status: DC | PRN

## 2016-02-12 MED FILL — FLUTICASONE PROP 50 MCG SPR: 50 | 30 days supply | Qty: 16 | Fill #1

## 2016-02-12 MED FILL — ONDANSETRON HCL 4 MG TABLET: 4 | 10 days supply | Qty: 30 | Fill #0

## 2016-02-12 NOTE — Patient Instructions (Signed)
Pyelonephritis, Adult °Introduction °Pyelonephritis is a kidney infection. The kidneys are organs that help clean your blood by moving waste out of your blood and into your pee (urine). This infection can happen quickly, or it can last for a long time. In most cases, it clears up with treatment and does not cause other problems. °Follow these instructions at home: °Medicines °· Take over-the-counter and prescription medicines only as told by your doctor. °· Take your antibiotic medicine as told by your doctor. Do not stop taking the medicine even if you start to feel better. °General instructions °· Drink enough fluid to keep your pee clear or pale yellow. °· Avoid caffeine, tea, and carbonated drinks. °· Pee (urinate) often. Avoid holding in pee for long periods of time. °· Pee before and after sex. °· After pooping (having a bowel movement), women should wipe from front to back. Use each tissue only once. °· Keep all follow-up visits as told by your doctor. This is important. °Contact a doctor if: °· You do not feel better after 2 days. °· Your symptoms get worse. °· You have a fever. °Get help right away if: °· You cannot take your medicine or drink fluids as told. °· You have chills and shaking. °· You throw up (vomit). °· You have very bad pain in your side (flank) or back. °· You feel very weak or you pass out (faint). °This information is not intended to replace advice given to you by your health care provider. Make sure you discuss any questions you have with your health care provider. °Document Released: 03/11/2004 Document Revised: 07/10/2015 Document Reviewed: 05/27/2014 °© 2017 [employer] ° °

## 2016-02-12 NOTE — Progress Notes (Signed)
Pre visit review using our clinic review tool, if applicable. No additional management support is needed unless otherwise documented below in the visit note. 

## 2016-02-12 NOTE — Progress Notes (Signed)
Patient ID: Michelle [name], female    DOB: January 21, 1956  Age: 60 y.o. MRN: [id number]    Subjective:  Subjective  HPI [name] presents for f/u pyleonephritis.  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    Review of Systems  Constitutional: Negative for activity change, appetite change, chills and fever.  Gastrointestinal: Positive for nausea. Negative for abdominal distention, abdominal pain and vomiting.  Genitourinary: Negative for difficulty urinating, dyspareunia, dysuria, flank pain, frequency, genital sores, hematuria, menstrual problem, pelvic pain, urgency, vaginal discharge and vaginal pain.  Musculoskeletal: Negative for back pain.    History Past Medical History:  Diagnosis Date  . Allergy   . Asthma   . Bronchitis   . Diabetes mellitus without complication ([place])   . Hyperlipidemia   . Hypertension   . Kidney stones   . Obesity   . Thyroid disease     She has a past surgical history that includes Tonsillectomy and adenoidectomy (childhood); Tubal ligation; and Vaginal hysterectomy (2011).   Her family history includes Heart disease in her mother; Hyperlipidemia in her mother; Hypertension in her mother; Stroke in her mother.She reports that she has never smoked. She has never used smokeless tobacco. She reports that she does not drink alcohol or use drugs.  Current Outpatient Prescriptions on File Prior to Visit  Medication Sig Dispense Refill  . albuterol (PROVENTIL HFA;VENTOLIN HFA) 108 (90 BASE) MCG/ACT inhaler INhale 1 puffs into lungs every 8 hours prn wheezing or SOB 1 Inhaler 0  . ALPHA LIPOIC ACID PO Take 1-2 capsules by mouth 2 (two) times daily.      . AMBULATORY NON FORMULARY MEDICATION Medication Name: Biest/Progesterone/Testosterone/DHEA 2.5/250/0.5/2mg     . aspirin 81 MG EC tablet Take 81 mg by mouth daily. Swallow whole.     . Calcium-Magnesium (CAL-MAG PO) Take 1 tablet by  mouth daily.    . Cholecalciferol (VITAMIN D3) 5000 UNITS CAPS Take 1 capsule by mouth daily.    . ciprofloxacin (CIPRO) 500 MG tablet Take 1 tablet (500 mg total) by mouth 2 (two) times daily. 20 tablet 0  . famotidine (PEPCID) 20 MG tablet TAKE 1 TABLET BY MOUTH AT BEDTIME 90 tablet 3  . fluticasone (FLONASE) 50 MCG/ACT nasal spray PLACE 2 SPRAYS INTO BOTH NOSTRILS DAILY. 16 g 6  . metFORMIN (GLUCOPHAGE-XR) 500 MG 24 hr tablet TAKE 1 TABLET (500 MG TOTAL) BY MOUTH 2 TIMES A DAY. 180 tablet 1  . MULTIPLE VITAMIN PO Take 1 tablet by mouth daily.      . pantoprazole (PROTONIX) 40 MG tablet TAKE 1 TABLET (40 MG TOTAL) BY MOUTH DAILY. TAKE 30-60 MIN BEFORE FIRST MEAL OF THE DAY 90 tablet 3  . PROBIOTIC CAPS Take 1 capsule by mouth daily.      . simvastatin (ZOCOR) 20 MG tablet TAKE 1 TABLET (20 MG) BY MOUTH DAILY. 90 tablet 1  . SYMBICORT 80-4.5 MCG/ACT inhaler INHALE 2 PUFFS INTO THE LUNGS TWICE A DAY 10.2 g 0   No current facility-administered medications on file prior to visit.      Objective:  Objective  Physical Exam  Constitutional: She is oriented to person, place, and time. She appears well-developed and well-nourished.  HENT:  Head: Normocephalic and atraumatic.  Eyes: Conjunctivae and EOM are normal.  Neck: Normal range of motion. Neck supple. No JVD present. Carotid bruit is not present. No thyromegaly present.  Cardiovascular: Normal  rate, regular rhythm and normal heart sounds.   No murmur heard. Pulmonary/Chest: Effort normal and breath sounds normal. No respiratory distress. She has no wheezes. She has no rales. She exhibits no tenderness.  Musculoskeletal: She exhibits no edema.  Neurological: She is alert and oriented to person, place, and time.  Psychiatric: She has a normal mood and affect. Her behavior is normal. Judgment and thought content normal.  Nursing note and vitals reviewed.  BP (!) 122/58 (BP Location: Left Arm, Patient Position: Sitting, Cuff Size: Normal)    Pulse 97   Temp 97.5 F (36.4 C) (Oral)   Resp 16   SpO2 98%  Wt Readings from Last 3 Encounters:  02/08/16 169 lb (76.7 kg)  06/09/15 180 lb (81.6 kg)  05/22/15 180 lb 9.6 oz (81.9 kg)     Lab Results  Component Value Date   WBC 22.6 (H) 02/08/2016   HGB 12.8 02/08/2016   HCT 38.2 02/08/2016   PLT 243 02/08/2016   GLUCOSE 168 (H) 02/08/2016   CHOL 141 01/29/2015   TRIG 81.0 01/29/2015   HDL 47.40 01/29/2015   LDLCALC 77 01/29/2015   ALT 19 01/29/2015   AST 16 01/29/2015   NA 133 (L) 02/08/2016   K 3.8 02/08/2016   CL 100 (L) 02/08/2016   CREATININE 1.00 02/08/2016   BUN 13 02/08/2016   CO2 23 02/08/2016   TSH 2.703 10/01/2014   HGBA1C 5.7 01/29/2015    Ct Renal Stone Study  Result Date: 02/08/2016 CLINICAL DATA:  Bilateral flank pain and hematuria. History of renal stones. EXAM: CT ABDOMEN AND PELVIS WITHOUT CONTRAST TECHNIQUE: Multidetector CT imaging of the abdomen and pelvis was performed following the standard protocol without IV contrast. COMPARISON:  CTA abdomen and pelvis 06/22/2007. FINDINGS: Lower chest: Mild dependent atelectasis is more notable on the left. No pleural or pericardial effusion. Hepatobiliary: There is fatty infiltration of the liver. The gallbladder is decompressed but otherwise unremarkable. Biliary tree appears normal. Pancreas: Unremarkable. No pancreatic ductal dilatation or surrounding inflammatory changes. Spleen: Normal in size without focal abnormality. Adrenals/Urinary Tract: There is some stranding about both kidneys and ureters. Walls of both the right and left renal pelvis and ureters are prominent, more notable on the left. No ureteral or urinary bladder stones are identified on either side. The patient has approximately 5 nonobstructing left renal stones measuring up to 0.7 cm in the lower pole. There is also a 0.7 cm nonobstructing stone in the lower pole of the right kidney. Walls of the urinary bladder appear mildly thickened.  Stomach/Bowel: Stomach is within normal limits. No evidence of bowel wall thickening, distention, or inflammatory changes. The appendix is not visualized and may have been removed. No evidence of appendicitis. Vascular/Lymphatic: Aortoiliac atherosclerosis without aneurysm. No lymphadenopathy. Reproductive: Status post hysterectomy. No adnexal masses. Other: Small fat containing umbilical hernia is noted. No fluid collection. Musculoskeletal: No fracture or worrisome lesion. IMPRESSION: Negative for hydronephrosis or ureteral stone in patient with bilateral nonobstructing renal stones, more numerous on the left. Stranding about both kidneys and thickening of the walls of the urinary bladder and ureters consistent with urinary tract infection. Aortoiliac atherosclerosis without aneurysm. Small fat containing umbilical hernia. Electronically Signed   By: Inge Rise M.D.   On: 02/08/2016 10:09     Assessment & Plan:  Plan  I have discontinued Ms. Shingler's clindamycin and promethazine. I am also having her start on ondansetron. Additionally, I am having her maintain her aspirin, MULTIPLE VITAMIN PO, Probiotic, ALPHA  LIPOIC ACID PO, AMBULATORY NON FORMULARY MEDICATION, Calcium-Magnesium (CAL-MAG PO), albuterol, Vitamin D3, simvastatin, metFORMIN, pantoprazole, famotidine, fluticasone, SYMBICORT, and ciprofloxacin.  Meds ordered this encounter  Medications  . ondansetron (ZOFRAN) 4 MG tablet    Sig: Take 1 tablet (4 mg total) by mouth every 8 (eight) hours as needed for nausea or vomiting.    Dispense:  30 tablet    Refill:  0    Problem List Items Addressed This Visit    None    Visit Diagnoses    Kidney infection    -  Primary   Relevant Orders   POCT Urinalysis Dipstick (Automated) (Completed)   Nausea without vomiting       Relevant Medications   ondansetron (ZOFRAN) 4 MG tablet    pt dx with pyleonephritis in er -- put on cipro Culture came back e coli-- sensitive to cipro Pt  feeling better-- just slightly nauseous Phenergan made her tired-- switch to zofran Hopes to go go work Saturday  Follow-up: No Follow-up on file.  [name], DO

## 2016-02-18 DIAGNOSIS — Z0279 Encounter for issue of other medical certificate: Secondary | ICD-10-CM

## 2016-02-20 ENCOUNTER — Telehealth: Payer: Self-pay | Admitting: *Deleted

## 2016-02-20 NOTE — Telephone Encounter (Signed)
Received completed and signed FMLA paperwork from Dr. Carollee Herter.  Faxed FMLA paperwork to [employer] @ [phone]).  Confirmation received.  Copy made to be sent for scanning.  Original given to the pt.//AB/CMA

## 2016-02-24 ENCOUNTER — Encounter: Payer: Self-pay | Admitting: Family Medicine

## 2016-02-24 ENCOUNTER — Ambulatory Visit (INDEPENDENT_AMBULATORY_CARE_PROVIDER_SITE_OTHER): Payer: 59 | Admitting: Family Medicine

## 2016-02-24 ENCOUNTER — Telehealth: Payer: Self-pay | Admitting: Family Medicine

## 2016-02-24 VITALS — BP 110/58 | HR 87 | Temp 97.9°F | Resp 16 | Ht 63.0 in | Wt 168.4 lb

## 2016-02-24 DIAGNOSIS — N009 Acute nephritic syndrome with unspecified morphologic changes: Secondary | ICD-10-CM | POA: Diagnosis not present

## 2016-02-24 DIAGNOSIS — N12 Tubulo-interstitial nephritis, not specified as acute or chronic: Secondary | ICD-10-CM | POA: Diagnosis not present

## 2016-02-24 LAB — POC URINALSYSI DIPSTICK (AUTOMATED)
Bilirubin, UA: NEGATIVE
Blood, UA: NEGATIVE
Glucose, UA: NEGATIVE
KETONES UA: NEGATIVE
LEUKOCYTES UA: NEGATIVE
Nitrite, UA: NEGATIVE
PH UA: 6
PROTEIN UA: NEGATIVE
Urobilinogen, UA: NEGATIVE

## 2016-02-24 NOTE — Assessment & Plan Note (Signed)
Urine repeated and is normal Symptoms resolved  Pt feeling 100% better

## 2016-02-24 NOTE — Progress Notes (Signed)
Patient ID: Michelle [name], female    DOB: October 02, 1955  Age: 61 y.o. MRN: [id number]    Subjective:  Subjective  HPI VERNETTE [name] presents for f/u pyelonephritis.  Pt back to work and needs fmla paperwork filled out   abx finished 02/18/2016  Review of Systems  Constitutional: Negative for appetite change, diaphoresis, fatigue and unexpected weight change.  Eyes: Negative for pain, redness and visual disturbance.  Respiratory: Negative for cough, chest tightness, shortness of breath and wheezing.   Cardiovascular: Negative for chest pain, palpitations and leg swelling.  Endocrine: Negative for cold intolerance, heat intolerance, polydipsia, polyphagia and polyuria.  Genitourinary: Negative for difficulty urinating, dysuria and frequency.  Neurological: Negative for dizziness, light-headedness, numbness and headaches.    History Past Medical History:  Diagnosis Date  . Allergy   . Asthma   . Bronchitis   . Diabetes mellitus without complication ([place])   . Hyperlipidemia   . Hypertension   . Kidney stones   . Obesity   . Thyroid disease     She has a past surgical history that includes Tonsillectomy and adenoidectomy (childhood); Tubal ligation; and Vaginal hysterectomy (2011).   Her family history includes Heart disease in her mother; Hyperlipidemia in her mother; Hypertension in her mother; Stroke in her mother.She reports that she has never smoked. She has never used smokeless tobacco. She reports that she does not drink alcohol or use drugs.  Current Outpatient Prescriptions on File Prior to Visit  Medication Sig Dispense Refill  . albuterol (PROVENTIL HFA;VENTOLIN HFA) 108 (90 BASE) MCG/ACT inhaler INhale 1 puffs into lungs every 8 hours prn wheezing or SOB 1 Inhaler 0  . ALPHA LIPOIC ACID PO Take 1-2 capsules by mouth 2 (two) times daily.      . AMBULATORY NON FORMULARY MEDICATION Medication Name: Biest/Progesterone/Testosterone/DHEA 2.5/250/0.5/2mg     . aspirin 81 MG  EC tablet Take 81 mg by mouth daily. Swallow whole.     . Calcium-Magnesium (CAL-MAG PO) Take 1 tablet by mouth daily.    . Cholecalciferol (VITAMIN D3) 5000 UNITS CAPS Take 1 capsule by mouth daily.    . famotidine (PEPCID) 20 MG tablet TAKE 1 TABLET BY MOUTH AT BEDTIME 90 tablet 3  . fluticasone (FLONASE) 50 MCG/ACT nasal spray PLACE 2 SPRAYS INTO BOTH NOSTRILS DAILY. 16 g 6  . metFORMIN (GLUCOPHAGE-XR) 500 MG 24 hr tablet TAKE 1 TABLET (500 MG TOTAL) BY MOUTH 2 TIMES A DAY. 180 tablet 1  . MULTIPLE VITAMIN PO Take 1 tablet by mouth daily.      . pantoprazole (PROTONIX) 40 MG tablet TAKE 1 TABLET (40 MG TOTAL) BY MOUTH DAILY. TAKE 30-60 MIN BEFORE FIRST MEAL OF THE DAY 90 tablet 3  . PROBIOTIC CAPS Take 1 capsule by mouth daily.      . simvastatin (ZOCOR) 20 MG tablet TAKE 1 TABLET (20 MG) BY MOUTH DAILY. 90 tablet 1  . SYMBICORT 80-4.5 MCG/ACT inhaler INHALE 2 PUFFS INTO THE LUNGS TWICE A DAY 10.2 g 0  . ciprofloxacin (CIPRO) 500 MG tablet Take 1 tablet (500 mg total) by mouth 2 (two) times daily. (Patient not taking: Reported on 02/24/2016) 20 tablet 0  . ondansetron (ZOFRAN) 4 MG tablet Take 1 tablet (4 mg total) by mouth every 8 (eight) hours as needed for nausea or vomiting. (Patient not taking: Reported on 02/24/2016) 30 tablet 0   No current facility-administered medications on file prior to visit.      Objective:  Objective  Physical Exam  Constitutional: She is oriented to person, place, and time. She appears well-developed and well-nourished.  HENT:  Head: Normocephalic and atraumatic.  Eyes: Conjunctivae and EOM are normal.  Neck: Normal range of motion. Neck supple. No JVD present. Carotid bruit is not present. No thyromegaly present.  Cardiovascular: Normal rate, regular rhythm and normal heart sounds.   No murmur heard. Pulmonary/Chest: Effort normal and breath sounds normal. No respiratory distress. She has no wheezes. She has no rales. She exhibits no tenderness.    Musculoskeletal: She exhibits no edema.  Neurological: She is alert and oriented to person, place, and time.  Psychiatric: She has a normal mood and affect. Her behavior is normal. Thought content normal.  Nursing note and vitals reviewed.  BP (!) 110/58 (BP Location: Left Arm, Patient Position: Sitting, Cuff Size: Normal)   Pulse 87   Temp 97.9 F (36.6 C) (Oral)   Resp 16   Ht 5\' 3"  (1.6 m)   Wt 168 lb 6.4 oz (76.4 kg)   SpO2 97%   BMI 29.83 kg/m  Wt Readings from Last 3 Encounters:  02/24/16 168 lb 6.4 oz (76.4 kg)  02/08/16 169 lb (76.7 kg)  06/09/15 180 lb (81.6 kg)     Lab Results  Component Value Date   WBC 22.6 (H) 02/08/2016   HGB 12.8 02/08/2016   HCT 38.2 02/08/2016   PLT 243 02/08/2016   GLUCOSE 168 (H) 02/08/2016   CHOL 141 01/29/2015   TRIG 81.0 01/29/2015   HDL 47.40 01/29/2015   LDLCALC 77 01/29/2015   ALT 19 01/29/2015   AST 16 01/29/2015   NA 133 (L) 02/08/2016   K 3.8 02/08/2016   CL 100 (L) 02/08/2016   CREATININE 1.00 02/08/2016   BUN 13 02/08/2016   CO2 23 02/08/2016   TSH 2.703 10/01/2014   HGBA1C 5.7 01/29/2015    Ct Renal Stone Study  Result Date: 02/08/2016 CLINICAL DATA:  Bilateral flank pain and hematuria. History of renal stones. EXAM: CT ABDOMEN AND PELVIS WITHOUT CONTRAST TECHNIQUE: Multidetector CT imaging of the abdomen and pelvis was performed following the standard protocol without IV contrast. COMPARISON:  CTA abdomen and pelvis 06/22/2007. FINDINGS: Lower chest: Mild dependent atelectasis is more notable on the left. No pleural or pericardial effusion. Hepatobiliary: There is fatty infiltration of the liver. The gallbladder is decompressed but otherwise unremarkable. Biliary tree appears normal. Pancreas: Unremarkable. No pancreatic ductal dilatation or surrounding inflammatory changes. Spleen: Normal in size without focal abnormality. Adrenals/Urinary Tract: There is some stranding about both kidneys and ureters. Walls of both  the right and left renal pelvis and ureters are prominent, more notable on the left. No ureteral or urinary bladder stones are identified on either side. The patient has approximately 5 nonobstructing left renal stones measuring up to 0.7 cm in the lower pole. There is also a 0.7 cm nonobstructing stone in the lower pole of the right kidney. Walls of the urinary bladder appear mildly thickened. Stomach/Bowel: Stomach is within normal limits. No evidence of bowel wall thickening, distention, or inflammatory changes. The appendix is not visualized and may have been removed. No evidence of appendicitis. Vascular/Lymphatic: Aortoiliac atherosclerosis without aneurysm. No lymphadenopathy. Reproductive: Status post hysterectomy. No adnexal masses. Other: Small fat containing umbilical hernia is noted. No fluid collection. Musculoskeletal: No fracture or worrisome lesion. IMPRESSION: Negative for hydronephrosis or ureteral stone in patient with bilateral nonobstructing renal stones, more numerous on the left. Stranding about both kidneys and thickening of the walls of the urinary  bladder and ureters consistent with urinary tract infection. Aortoiliac atherosclerosis without aneurysm. Small fat containing umbilical hernia. Electronically Signed   By: Inge Rise M.D.   On: 02/08/2016 10:09     Assessment & Plan:  Plan  I am having Ms. Pardini maintain her aspirin, MULTIPLE VITAMIN PO, Probiotic, ALPHA LIPOIC ACID PO, AMBULATORY NON FORMULARY MEDICATION, Calcium-Magnesium (CAL-MAG PO), albuterol, Vitamin D3, simvastatin, metFORMIN, pantoprazole, famotidine, fluticasone, SYMBICORT, ciprofloxacin, and ondansetron.  No orders of the defined types were placed in this encounter.   Problem List Items Addressed This Visit      Unprioritized   Pyelonephritis    Urine repeated and is normal Symptoms resolved  Pt feeling 100% better       Other Visit Diagnoses    Nephritis, acute    -  Primary   Relevant  Orders   POCT Urinalysis Dipstick (Automated) (Completed)      Follow-up: No Follow-up on file.  [name], DO

## 2016-02-24 NOTE — Patient Instructions (Signed)
Pyelonephritis, Adult °Introduction °Pyelonephritis is a kidney infection. The kidneys are organs that help clean your blood by moving waste out of your blood and into your pee (urine). This infection can happen quickly, or it can last for a long time. In most cases, it clears up with treatment and does not cause other problems. °Follow these instructions at home: °Medicines °· Take over-the-counter and prescription medicines only as told by your doctor. °· Take your antibiotic medicine as told by your doctor. Do not stop taking the medicine even if you start to feel better. °General instructions °· Drink enough fluid to keep your pee clear or pale yellow. °· Avoid caffeine, tea, and carbonated drinks. °· Pee (urinate) often. Avoid holding in pee for long periods of time. °· Pee before and after sex. °· After pooping (having a bowel movement), women should wipe from front to back. Use each tissue only once. °· Keep all follow-up visits as told by your doctor. This is important. °Contact a doctor if: °· You do not feel better after 2 days. °· Your symptoms get worse. °· You have a fever. °Get help right away if: °· You cannot take your medicine or drink fluids as told. °· You have chills and shaking. °· You throw up (vomit). °· You have very bad pain in your side (flank) or back. °· You feel very weak or you pass out (faint). °This information is not intended to replace advice given to you by your health care provider. Make sure you discuss any questions you have with your health care provider. °Document Released: 03/11/2004 Document Revised: 07/10/2015 Document Reviewed: 05/27/2014 °© 2017 [employer] ° °

## 2016-02-24 NOTE — Telephone Encounter (Signed)
Copy of paperwork placed in scan basket to go back to the doctor.

## 2016-02-24 NOTE — Telephone Encounter (Signed)
Disability Paperwork faxed to [phone].

## 2016-02-25 NOTE — Telephone Encounter (Signed)
Pt called in because she says that she is returning call. Pt says that FMLA was denied but she did have Katie to fax disability forms yesterday 02/24/16 at visit. She says that to her knowledge everything is good with forms and nothing more is needed. She will call us back if she needs anything further.   No call back needed.    Thanks

## 2016-02-25 NOTE — Telephone Encounter (Signed)
Patient called stating that page 2 of her disability forms were not received by the provider. Please advise

## 2016-02-27 DIAGNOSIS — H5203 Hypermetropia, bilateral: Secondary | ICD-10-CM | POA: Diagnosis not present

## 2016-02-27 DIAGNOSIS — E119 Type 2 diabetes mellitus without complications: Secondary | ICD-10-CM | POA: Diagnosis not present

## 2016-02-27 DIAGNOSIS — H52223 Regular astigmatism, bilateral: Secondary | ICD-10-CM | POA: Diagnosis not present

## 2016-02-27 DIAGNOSIS — H2513 Age-related nuclear cataract, bilateral: Secondary | ICD-10-CM | POA: Diagnosis not present

## 2016-02-27 DIAGNOSIS — D3131 Benign neoplasm of right choroid: Secondary | ICD-10-CM | POA: Diagnosis not present

## 2016-02-27 LAB — HM DIABETES EYE EXAM

## 2016-03-01 NOTE — Telephone Encounter (Signed)
Left message on pt's vm to give [place] a call back. LB

## 2016-03-01 NOTE — Telephone Encounter (Signed)
Pt called in returning call. No call back needed. Pt would just like to have page 2 re-faxed to [employer]. She says that they never received it. Fax # is provided on paper work.

## 2016-03-03 ENCOUNTER — Other Ambulatory Visit: Payer: Self-pay | Admitting: Family Medicine

## 2016-03-05 MED FILL — SIMVASTATIN 20 MG TABLET: 20 | 90 days supply | Qty: 90 | Fill #0

## 2016-03-05 NOTE — Telephone Encounter (Signed)
Paper work was faxed and sent to scan. Patient has a copy of the original paperwork or at least a copy of the paper work. Patient needs to locate the copy she has because scan has not put it into her chart yet. It may take a few weeks before it shows in patients chart.   PC

## 2016-03-05 NOTE — Telephone Encounter (Signed)
Patient has a copy of the forms that were faxed.Marland KitchenMarland Kitchen

## 2016-03-05 NOTE — Telephone Encounter (Signed)
Patient wanted to inform

## 2016-03-09 DIAGNOSIS — R5383 Other fatigue: Secondary | ICD-10-CM | POA: Diagnosis not present

## 2016-03-09 DIAGNOSIS — E119 Type 2 diabetes mellitus without complications: Secondary | ICD-10-CM | POA: Diagnosis not present

## 2016-03-09 DIAGNOSIS — J329 Chronic sinusitis, unspecified: Secondary | ICD-10-CM | POA: Diagnosis not present

## 2016-03-09 DIAGNOSIS — N951 Menopausal and female climacteric states: Secondary | ICD-10-CM | POA: Diagnosis not present

## 2016-03-16 ENCOUNTER — Other Ambulatory Visit: Payer: Self-pay | Admitting: Family Medicine

## 2016-03-16 DIAGNOSIS — Z8709 Personal history of other diseases of the respiratory system: Secondary | ICD-10-CM

## 2016-03-16 MED FILL — FLUTICASONE PROP 50 MCG SPR: 50 | 30 days supply | Qty: 16 | Fill #2

## 2016-03-16 MED FILL — SYMBICORT 80-4.5 MCG INH: 80-4.5 | 30 days supply | Qty: 10 | Fill #0

## 2016-03-19 MED FILL — NATURE-THROID 32.5 MG TAB: 32.5 | 30 days supply | Qty: 30 | Fill #0

## 2016-03-25 ENCOUNTER — Other Ambulatory Visit (HOSPITAL_BASED_OUTPATIENT_CLINIC_OR_DEPARTMENT_OTHER): Payer: Self-pay | Admitting: Obstetrics and Gynecology

## 2016-03-25 DIAGNOSIS — Z1231 Encounter for screening mammogram for malignant neoplasm of breast: Secondary | ICD-10-CM

## 2016-03-25 DIAGNOSIS — M859 Disorder of bone density and structure, unspecified: Secondary | ICD-10-CM

## 2016-03-25 DIAGNOSIS — Z01419 Encounter for gynecological examination (general) (routine) without abnormal findings: Secondary | ICD-10-CM | POA: Diagnosis not present

## 2016-03-25 DIAGNOSIS — M8580 Other specified disorders of bone density and structure, unspecified site: Secondary | ICD-10-CM

## 2016-03-25 DIAGNOSIS — Z6829 Body mass index (BMI) 29.0-29.9, adult: Secondary | ICD-10-CM | POA: Diagnosis not present

## 2016-03-29 ENCOUNTER — Ambulatory Visit (HOSPITAL_BASED_OUTPATIENT_CLINIC_OR_DEPARTMENT_OTHER): Payer: 59

## 2016-03-29 ENCOUNTER — Ambulatory Visit (HOSPITAL_BASED_OUTPATIENT_CLINIC_OR_DEPARTMENT_OTHER)
Admission: RE | Admit: 2016-03-29 | Discharge: 2016-03-29 | Disposition: A | Discharge status: Home / Self Care | Payer: 59 | Source: Ambulatory Visit | Attending: Obstetrics and Gynecology | Admitting: Obstetrics and Gynecology

## 2016-03-29 DIAGNOSIS — Z1231 Encounter for screening mammogram for malignant neoplasm of breast: Secondary | ICD-10-CM

## 2016-04-01 ENCOUNTER — Telehealth: Payer: Self-pay | Admitting: Family Medicine

## 2016-04-01 ENCOUNTER — Ambulatory Visit (HOSPITAL_BASED_OUTPATIENT_CLINIC_OR_DEPARTMENT_OTHER)
Admission: RE | Admit: 2016-04-01 | Discharge: 2016-04-01 | Disposition: A | Discharge status: Home / Self Care | Payer: 59 | Source: Ambulatory Visit | Attending: Obstetrics and Gynecology | Admitting: Obstetrics and Gynecology

## 2016-04-01 ENCOUNTER — Other Ambulatory Visit (HOSPITAL_BASED_OUTPATIENT_CLINIC_OR_DEPARTMENT_OTHER): Payer: Self-pay | Admitting: Obstetrics and Gynecology

## 2016-04-01 ENCOUNTER — Encounter: Payer: Self-pay | Admitting: *Deleted

## 2016-04-01 ENCOUNTER — Encounter (HOSPITAL_BASED_OUTPATIENT_CLINIC_OR_DEPARTMENT_OTHER): Payer: Self-pay

## 2016-04-01 DIAGNOSIS — Z1231 Encounter for screening mammogram for malignant neoplasm of breast: Secondary | ICD-10-CM | POA: Insufficient documentation

## 2016-04-01 DIAGNOSIS — E2839 Other primary ovarian failure: Secondary | ICD-10-CM | POA: Diagnosis not present

## 2016-04-01 DIAGNOSIS — M8580 Other specified disorders of bone density and structure, unspecified site: Secondary | ICD-10-CM

## 2016-04-01 DIAGNOSIS — M859 Disorder of bone density and structure, unspecified: Secondary | ICD-10-CM

## 2016-04-01 NOTE — Telephone Encounter (Signed)
Patient notified that letter is ready for pick up.

## 2016-04-01 NOTE — Telephone Encounter (Signed)
Patient states that she is required to get the Tdap injection for work however she is allergic. She states that she needs a doctors note stating that she is allergic and cannot get the shot. Please advise  Phone: [phone]

## 2016-04-14 MED FILL — FLUTICASONE PROP 50 MCG SPR: 50 | 30 days supply | Qty: 16 | Fill #3

## 2016-04-21 ENCOUNTER — Other Ambulatory Visit: Payer: Self-pay | Admitting: Family Medicine

## 2016-04-21 MED FILL — METFORMIN HCL ER 500 MG TAB: 500 | 90 days supply | Qty: 180 | Fill #0

## 2016-05-01 IMAGING — CT CT PARANASAL SINUSES LIMITED
1 of 2 series · 16 of 19 positions shown, 20 images · non-contrast
Comparison: 06/20/2007 head CT

CLINICAL DATA: Dyspnea and productive cough. Evaluate for
sinusitis.

EXAM:
CT PARANASAL SINUS LIMITED WITHOUT CONTRAST
TECHNIQUE: Non-contiguous multidetector CT images of the paranasal sinuses were
obtained in a single plane without contrast.

[Series 4: ltd sinus 3.0 h30s · axial · 0.32mm/px · z∈[-102,-5]mm · 16 of 18 slices shown, 20 images]
[im 2/18  brain]
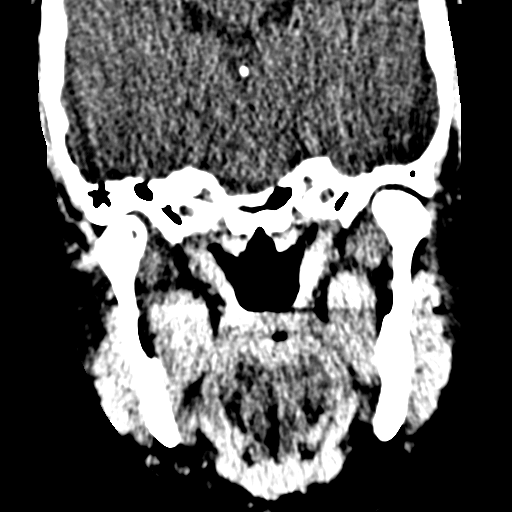
[im 2/18  bone]
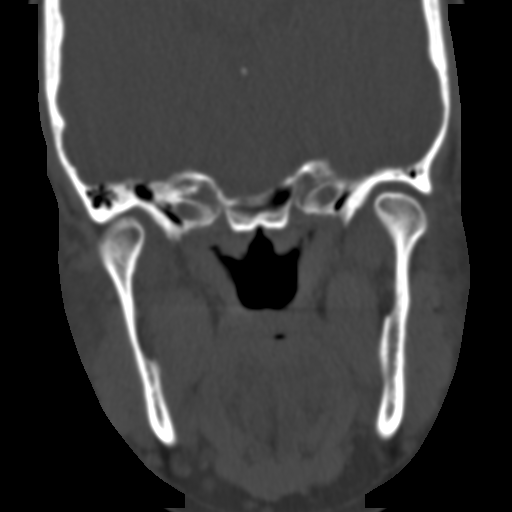
[im 3/18  bone]
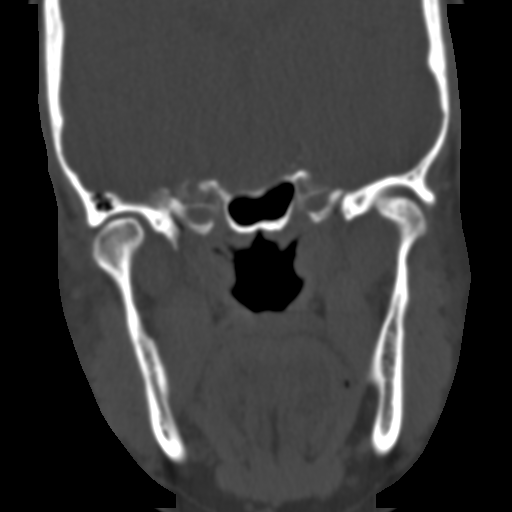
[im 4/18  bone]
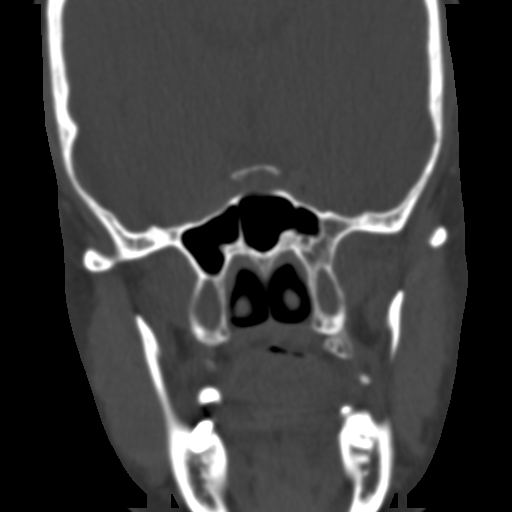
[im 5/18  bone]
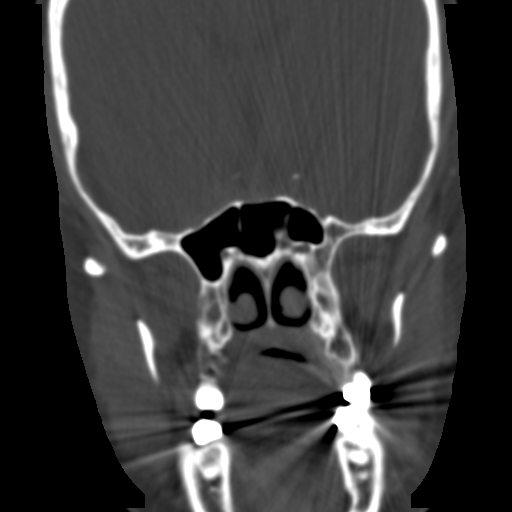
[im 6/18  brain]
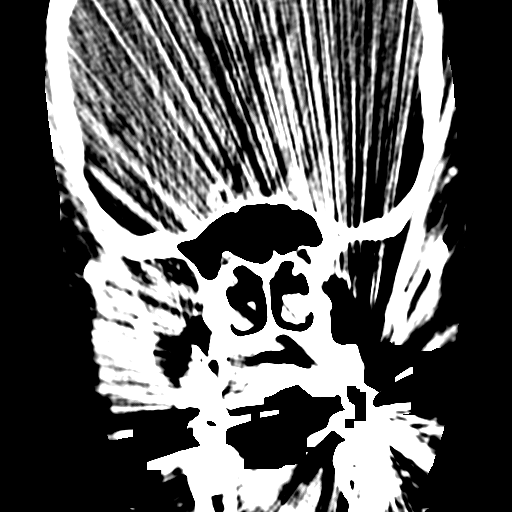
[im 6/18  bone]
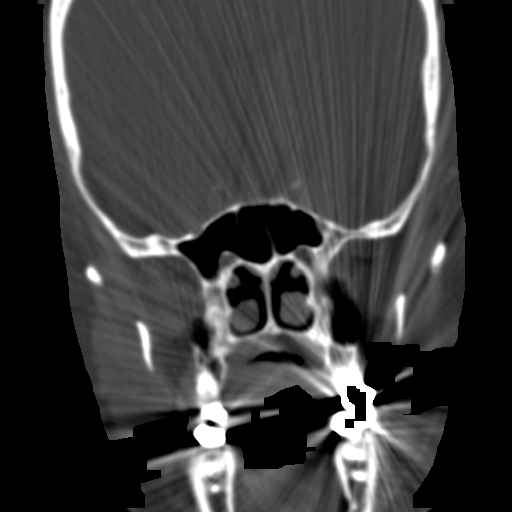
[im 7/18  bone]
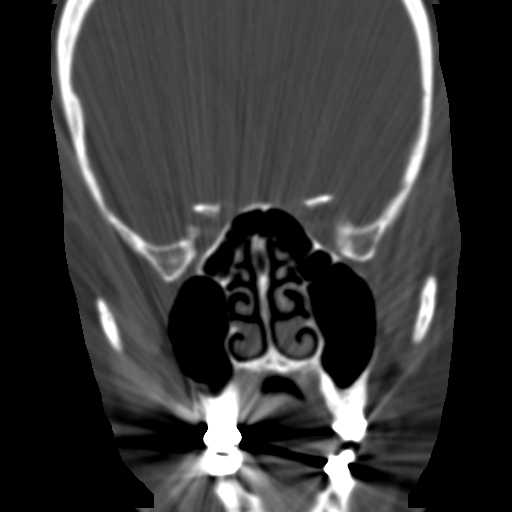
[im 8/18  bone]
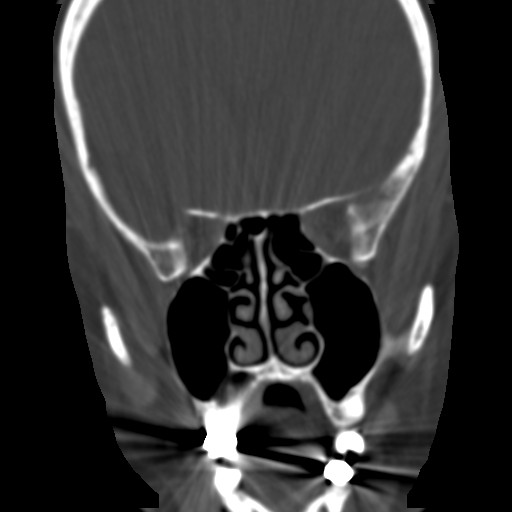
[im 9/18  bone]
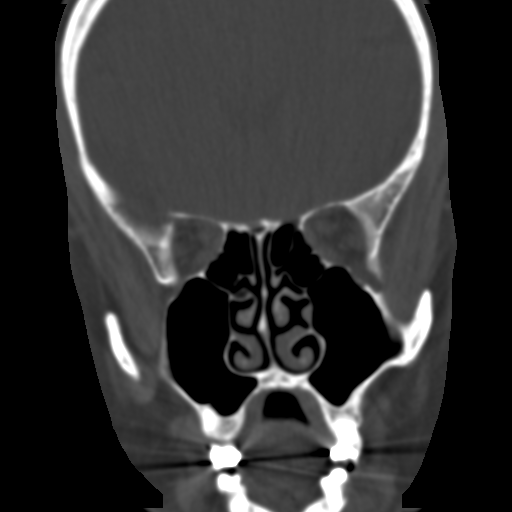
[im 10/18  brain]
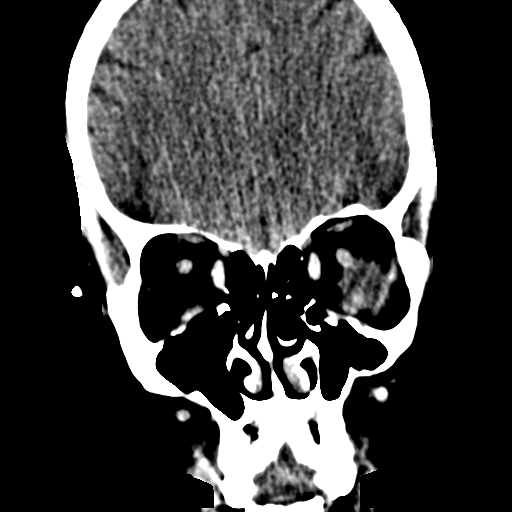
[im 10/18  bone]
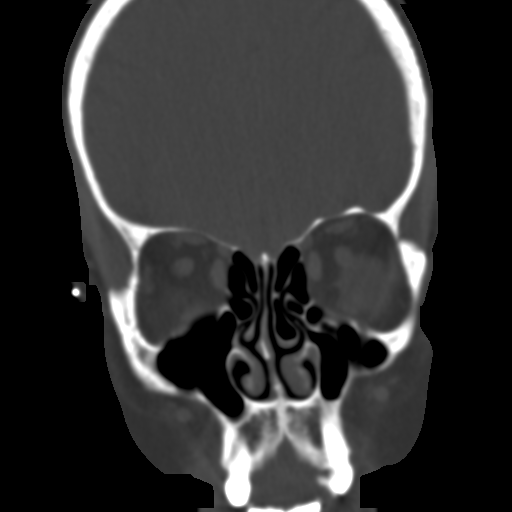
[im 11/18  bone]
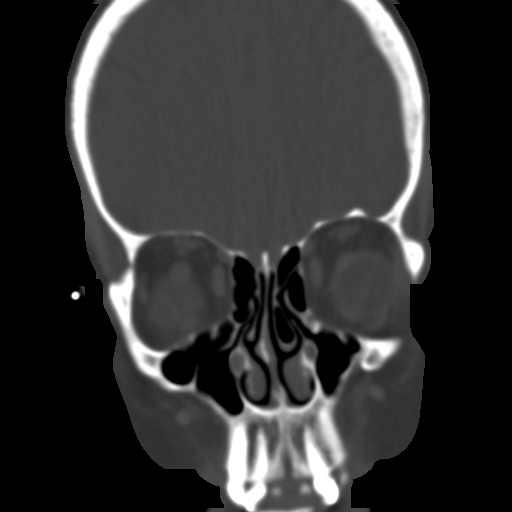
[im 12/18  bone]
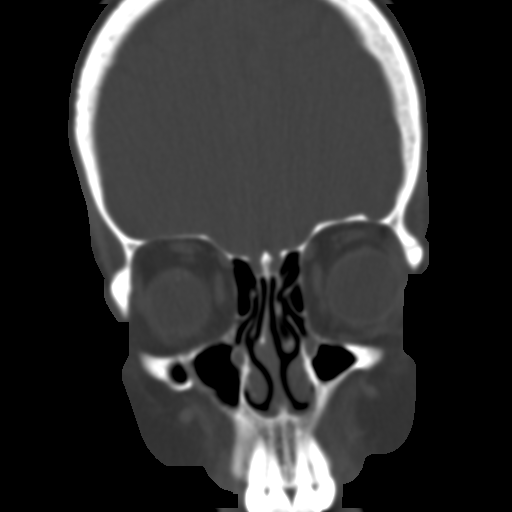
[im 13/18  bone]
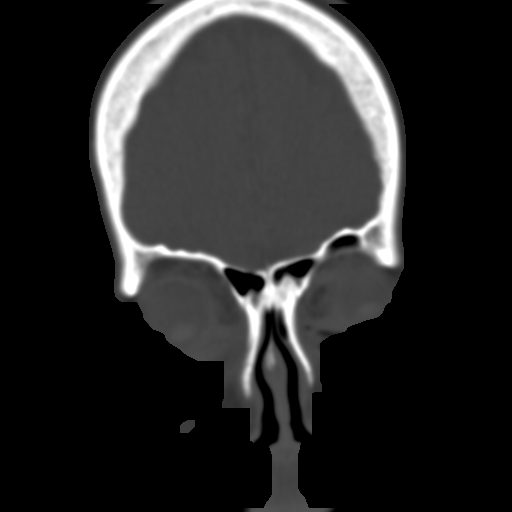
[im 14/18  brain]
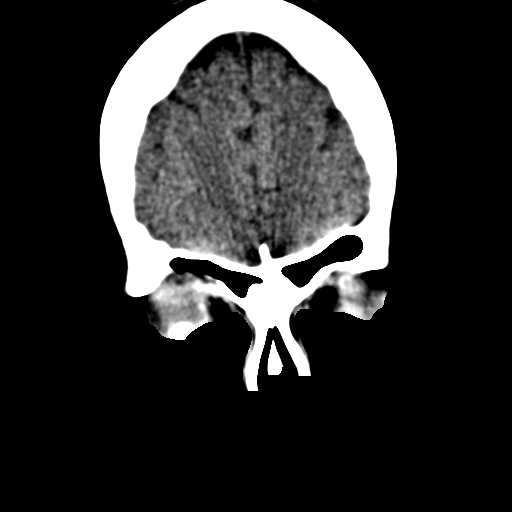
[im 14/18  bone]
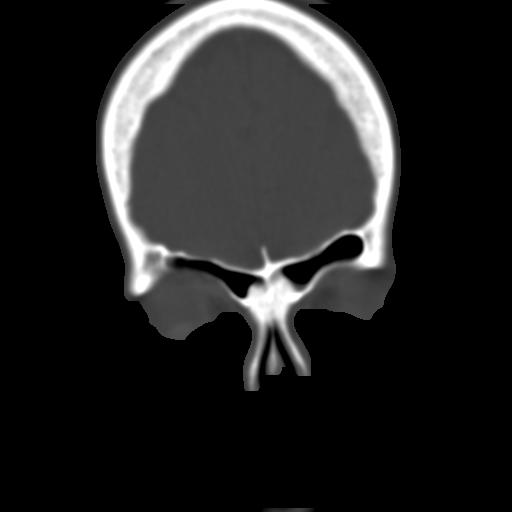
[im 15/18  bone]
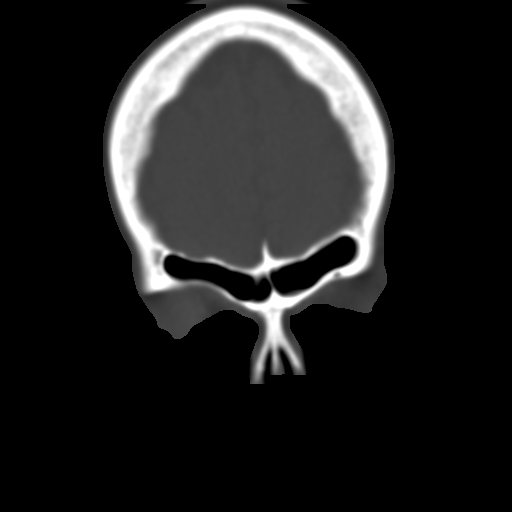
[im 16/18  bone]
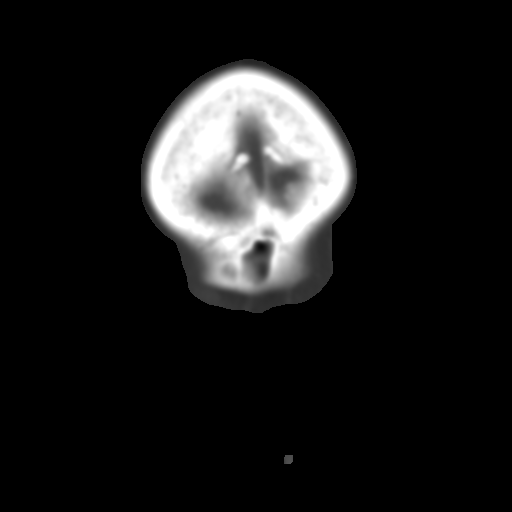
[im 17/18  bone]
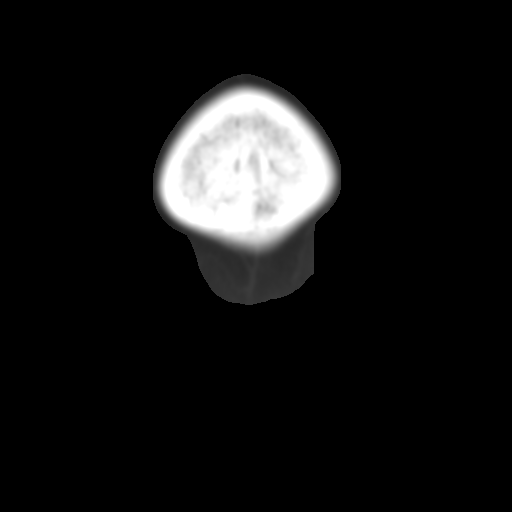

[16 of 19 positions shown; findings below may reference images not displayed]

FINDINGS: Mild mucosal thickening in the inferior left maxillary sinus. No
indication of acute sinusitis.

Mild rightward nasal septal deviation spurring without turbinate
contact. Left middle concha bullosa. Partial intracranial imaging is
negative. Bilateral optic drusen noted, incidental based on the
history.
IMPRESSION: Mild chronic appearing left frontal sinusitis.

## 2016-05-10 ENCOUNTER — Other Ambulatory Visit: Payer: Self-pay | Admitting: Family Medicine

## 2016-05-10 DIAGNOSIS — Z8709 Personal history of other diseases of the respiratory system: Secondary | ICD-10-CM

## 2016-05-10 MED FILL — SYMBICORT 80-4.5 MCG INH: 80-4.5 | 30 days supply | Qty: 10 | Fill #0

## 2016-05-26 MED FILL — FLUTICASONE PROP 50 MCG SPR: 50 | 30 days supply | Qty: 16 | Fill #4

## 2016-05-27 MED FILL — PANTOPRAZOLE SOD DR 40 MG T: 40 | 90 days supply | Qty: 90 | Fill #1

## 2016-06-09 DIAGNOSIS — R5383 Other fatigue: Secondary | ICD-10-CM | POA: Diagnosis not present

## 2016-06-09 DIAGNOSIS — N951 Menopausal and female climacteric states: Secondary | ICD-10-CM | POA: Diagnosis not present

## 2016-06-09 DIAGNOSIS — E782 Mixed hyperlipidemia: Secondary | ICD-10-CM | POA: Diagnosis not present

## 2016-06-09 DIAGNOSIS — K219 Gastro-esophageal reflux disease without esophagitis: Secondary | ICD-10-CM | POA: Diagnosis not present

## 2016-06-16 MED FILL — SIMVASTATIN 20 MG TABLET: 20 | 90 days supply | Qty: 90 | Fill #1

## 2016-06-21 DIAGNOSIS — E782 Mixed hyperlipidemia: Secondary | ICD-10-CM | POA: Diagnosis not present

## 2016-06-21 DIAGNOSIS — K219 Gastro-esophageal reflux disease without esophagitis: Secondary | ICD-10-CM | POA: Diagnosis not present

## 2016-06-21 DIAGNOSIS — N951 Menopausal and female climacteric states: Secondary | ICD-10-CM | POA: Diagnosis not present

## 2016-06-21 DIAGNOSIS — E119 Type 2 diabetes mellitus without complications: Secondary | ICD-10-CM | POA: Diagnosis not present

## 2016-06-21 DIAGNOSIS — R5383 Other fatigue: Secondary | ICD-10-CM | POA: Diagnosis not present

## 2016-06-21 DIAGNOSIS — E039 Hypothyroidism, unspecified: Secondary | ICD-10-CM | POA: Diagnosis not present

## 2016-06-28 MED FILL — FLUTICASONE PROP 50 MCG SPR: 50 | 30 days supply | Qty: 16 | Fill #5

## 2016-07-01 ENCOUNTER — Encounter: Payer: Self-pay | Admitting: Family Medicine

## 2016-07-01 ENCOUNTER — Ambulatory Visit (INDEPENDENT_AMBULATORY_CARE_PROVIDER_SITE_OTHER): Payer: 59 | Admitting: Family Medicine

## 2016-07-01 VITALS — BP 156/70 | HR 98 | Temp 98.5°F | Resp 16 | Ht 63.0 in

## 2016-07-01 DIAGNOSIS — I10 Essential (primary) hypertension: Secondary | ICD-10-CM | POA: Diagnosis not present

## 2016-07-01 DIAGNOSIS — R079 Chest pain, unspecified: Secondary | ICD-10-CM

## 2016-07-01 DIAGNOSIS — Z8709 Personal history of other diseases of the respiratory system: Secondary | ICD-10-CM

## 2016-07-01 MED ORDER — AMLODIPINE BESYLATE 5 MG PO TABS: 5.0000 mg | ORAL_TABLET | Freq: Every day | ORAL | 2 refills | Status: DC

## 2016-07-01 MED ORDER — BUDESONIDE-FORMOTEROL FUMARATE 80-4.5 MCG/ACT IN AERO: INHALATION_SPRAY | RESPIRATORY_TRACT | 5 refills | Status: DC

## 2016-07-01 MED FILL — AMLODIPINE BESYLATE 5 MG TA: 5 | 30 days supply | Qty: 30 | Fill #0

## 2016-07-01 MED FILL — SYMBICORT 80-4.5 MCG INH: 80-4.5 | 30 days supply | Qty: 10 | Fill #0

## 2016-07-01 NOTE — Assessment & Plan Note (Signed)
Poorly controlled , encouraged DASH diet, minimize caffeine and obtain adequate sleep. Report concerning symptoms and follow up as directed and as needed------pt started norvasc 5/12

## 2016-07-01 NOTE — Progress Notes (Signed)
Patient ID: Michelle [name], female   DOB: [birth year], 61 y.o.   MRN: [id number]     Subjective:  I acted as a Education administrator for Dr. Carollee Herter.  Guerry Bruin, [place]   Patient ID: Michelle [name], female    DOB: 12-Jan-1956, 61 y.o.   MRN: [id number]  Chief Complaint  Patient presents with  . Hypertension    HPI  Patient is in today for high blood pressure.  Usually has some episodes.  This episode started on 06/21/16 at Dr. Cassie Freer office.  From there on she checked blood pressures at home 130s-140s/60s-70s.  She started Norvasc 5mg  on 06/26/16.  Also needs a refill on Symbicort.  She has been doing well on Symbicort with no complaints.  Patient Care Team: Carollee Herter, Alferd Apa, DO as PCP - General (Family Medicine) Marica Otter, OD (Optometry)   Past Medical History:  Diagnosis Date  . Allergy   . Asthma   . Bronchitis   . Diabetes mellitus without complication ([place])   . Hyperlipidemia   . Hypertension   . Kidney stones   . Obesity   . Thyroid disease     Past Surgical History:  Procedure Laterality Date  . TONSILLECTOMY AND ADENOIDECTOMY  childhood  . TUBAL LIGATION    . VAGINAL HYSTERECTOMY  2011    Family History  Problem Relation Age of Onset  . Heart disease Mother   . Hyperlipidemia Mother   . Stroke Mother   . Hypertension Mother     Social History   Social History  . Marital status: Married    Spouse name: N/A  . Number of children: N/A  . Years of education: N/A   Occupational History  . RN    Social History Main Topics  . Smoking status: Never Smoker  . Smokeless tobacco: Never Used  . Alcohol use No  . Drug use: No  . Sexual activity: Yes   Other Topics Concern  . Not on file   Social History Narrative  . No narrative on file    Outpatient Medications Prior to Visit  Medication Sig Dispense Refill  . albuterol (PROVENTIL HFA;VENTOLIN HFA) 108 (90 BASE) MCG/ACT inhaler INhale 1 puffs into lungs every 8 hours prn wheezing or SOB 1 Inhaler 0  .  ALPHA LIPOIC ACID PO Take 1-2 capsules by mouth 2 (two) times daily.      . AMBULATORY NON FORMULARY MEDICATION Medication Name: Biest/Progesterone/Testosterone/DHEA 2.5/250/0.5/2mg     . aspirin 81 MG EC tablet Take 81 mg by mouth daily. Swallow whole.     . Calcium-Magnesium (CAL-MAG PO) Take 1 tablet by mouth daily.    . Cholecalciferol (VITAMIN D3) 5000 UNITS CAPS Take 1 capsule by mouth daily.    . fluticasone (FLONASE) 50 MCG/ACT nasal spray PLACE 2 SPRAYS INTO BOTH NOSTRILS DAILY. 16 g 6  . MULTIPLE VITAMIN PO Take 1 tablet by mouth daily.      Marland Kitchen PROBIOTIC CAPS Take 1 capsule by mouth daily.      . SYMBICORT 80-4.5 MCG/ACT inhaler INHALE 2 PUFFS BY MOUTH INTO THE LUNGS TWICE A DAY *PT NEED APPT* 10.2 g 0  . ciprofloxacin (CIPRO) 500 MG tablet Take 1 tablet (500 mg total) by mouth 2 (two) times daily. 20 tablet 0  . famotidine (PEPCID) 20 MG tablet TAKE 1 TABLET BY MOUTH AT BEDTIME 90 tablet 3  . metFORMIN (GLUCOPHAGE-XR) 500 MG 24 hr tablet TAKE 1 TABLET (500 MG TOTAL) BY MOUTH 2 TIMES A DAY. (Patient taking  differently: TAKE 1 TABLET (500 MG TOTAL) BY MOUTH DAILY) 180 tablet 1  . ondansetron (ZOFRAN) 4 MG tablet Take 1 tablet (4 mg total) by mouth every 8 (eight) hours as needed for nausea or vomiting. 30 tablet 0  . pantoprazole (PROTONIX) 40 MG tablet TAKE 1 TABLET (40 MG TOTAL) BY MOUTH DAILY. TAKE 30-60 MIN BEFORE FIRST MEAL OF THE DAY 90 tablet 3  . simvastatin (ZOCOR) 20 MG tablet TAKE 1 TABLET (20 MG) BY MOUTH DAILY. 90 tablet 1   No facility-administered medications prior to visit.     Allergies  Allergen Reactions  . Shrimp [Shellfish Allergy] Itching    Allergic to shrimp.  Wheezing and tightness in chest.  . Tdap [Diphth-Acell Pertussis-Tetanus] Other (See Comments)    Change in level of consciousness  . Influenza Vaccine Live     Sever flu symptoms, chest tightness, wheezing  . Latex Rash    Review of Systems  Constitutional: Negative for fever and malaise/fatigue.    HENT: Negative for congestion.   Eyes: Negative for blurred vision.  Respiratory: Negative for cough and shortness of breath.   Cardiovascular: Negative for chest pain, palpitations and leg swelling.       Has had some burning in left side of chest and down left arm.    Gastrointestinal: Negative for vomiting.  Musculoskeletal: Negative for back pain.  Skin: Negative for rash.  Neurological: Negative for loss of consciousness and headaches.       Objective:    Physical Exam  Constitutional: She is oriented to person, place, and time. She appears well-developed and well-nourished. No distress.  HENT:  Head: Normocephalic and atraumatic.  Eyes: Conjunctivae and EOM are normal.  Neck: Normal range of motion. Neck supple. No JVD present. Carotid bruit is not present. No thyromegaly present.  Cardiovascular: Normal rate, regular rhythm and normal heart sounds.   No murmur heard. Pulmonary/Chest: Effort normal and breath sounds normal. No respiratory distress. She has no wheezes. She has no rales. She exhibits no tenderness.  Abdominal: Soft. Bowel sounds are normal. There is no tenderness.  Musculoskeletal: Normal range of motion. She exhibits no edema or deformity.  Neurological: She is alert and oriented to person, place, and time.  Skin: Skin is warm and dry. She is not diaphoretic.  Psychiatric: She has a normal mood and affect. Her behavior is normal. Judgment and thought content normal.  Nursing note and vitals reviewed.   BP (!) 156/70   Pulse 98   Temp 98.5 F (36.9 C) (Oral)   Resp 16   Ht 5\' 3"  (1.6 m)   SpO2 98%  Wt Readings from Last 3 Encounters:  02/24/16 168 lb 6.4 oz (76.4 kg)  02/08/16 169 lb (76.7 kg)  06/09/15 180 lb (81.6 kg)   BP Readings from Last 3 Encounters:  07/01/16 (!) 156/70  02/24/16 (!) 110/58  02/12/16 (!) 122/58      There is no immunization history on file for this patient.  Health Maintenance  Topic Date Due  . HIV Screening   03/09/1968  . COLONOSCOPY  03/10/2003  . MAMMOGRAM  04/01/2017  . PAP SMEAR  06/24/2017  . Hepatitis C Screening  Completed    Lab Results  Component Value Date   WBC 22.6 (H) 02/08/2016   HGB 12.8 02/08/2016   HCT 38.2 02/08/2016   PLT 243 02/08/2016   GLUCOSE 168 (H) 02/08/2016   CHOL 141 01/29/2015   TRIG 81.0 01/29/2015   HDL 47.40 01/29/2015  LDLCALC 77 01/29/2015   ALT 19 01/29/2015   AST 16 01/29/2015   NA 133 (L) 02/08/2016   K 3.8 02/08/2016   CL 100 (L) 02/08/2016   CREATININE 1.00 02/08/2016   BUN 13 02/08/2016   CO2 23 02/08/2016   TSH 2.703 10/01/2014   HGBA1C 5.7 01/29/2015    Lab Results  Component Value Date   TSH 2.703 10/01/2014   Lab Results  Component Value Date   WBC 22.6 (H) 02/08/2016   HGB 12.8 02/08/2016   HCT 38.2 02/08/2016   MCV 88.0 02/08/2016   PLT 243 02/08/2016   Lab Results  Component Value Date   NA 133 (L) 02/08/2016   K 3.8 02/08/2016   CO2 23 02/08/2016   GLUCOSE 168 (H) 02/08/2016   BUN 13 02/08/2016   CREATININE 1.00 02/08/2016   BILITOT 0.5 01/29/2015   ALKPHOS 35 (L) 01/29/2015   AST 16 01/29/2015   ALT 19 01/29/2015   PROT 6.7 01/29/2015   ALBUMIN 4.0 01/29/2015   CALCIUM 9.3 02/08/2016   ANIONGAP 10 02/08/2016   GFR 88.69 01/29/2015   Lab Results  Component Value Date   CHOL 141 01/29/2015   Lab Results  Component Value Date   HDL 47.40 01/29/2015   Lab Results  Component Value Date   LDLCALC 77 01/29/2015   Lab Results  Component Value Date   TRIG 81.0 01/29/2015   Lab Results  Component Value Date   CHOLHDL 3 01/29/2015   Lab Results  Component Value Date   HGBA1C 5.7 01/29/2015      ekg -- NSR   Assessment & Plan:   Problem List Items Addressed This Visit      Unprioritized   HTN (hypertension)    Poorly controlled , encouraged DASH diet, minimize caffeine and obtain adequate sleep. Report concerning symptoms and follow up as directed and as needed------pt started norvasc 5/12         Relevant Medications   amLODipine (NORVASC) 5 MG tablet    Other Visit Diagnoses    Chest pain, unspecified type    -  Primary   Relevant Orders   EKG 12-Lead (Completed)   Hx of acute bronchitis with bronchospasm       Relevant Medications   budesonide-formoterol (SYMBICORT) 80-4.5 MCG/ACT inhaler      I have discontinued Ms. Diliberto's pantoprazole, famotidine, ciprofloxacin, ondansetron, and simvastatin. I have changed her SYMBICORT to budesonide-formoterol. I have also changed her amLODipine. Additionally, I am having her maintain her aspirin, MULTIPLE VITAMIN PO, Probiotic, ALPHA LIPOIC ACID PO, AMBULATORY NON FORMULARY MEDICATION, Calcium-Magnesium (CAL-MAG PO), albuterol, Vitamin D3, fluticasone, and metFORMIN.  Meds ordered this encounter  Medications  . metFORMIN (GLUCOPHAGE-XR) 500 MG 24 hr tablet    Sig: Take 1 tablet by mouth daily.  Marland Kitchen DISCONTD: amLODipine (NORVASC) 5 MG tablet    Sig: Take 5 mg by mouth daily.  . budesonide-formoterol (SYMBICORT) 80-4.5 MCG/ACT inhaler    Sig: INHALE 2 PUFFS BY MOUTH INTO THE LUNGS TWICE A DAY    Dispense:  10.2 g    Refill:  5  . amLODipine (NORVASC) 5 MG tablet    Sig: Take 1 tablet (5 mg total) by mouth daily.    Dispense:  30 tablet    Refill:  2    CMA served as scribe during this visit. History, Physical and Plan performed by medical provider. Documentation and orders reviewed and attested to.  [name], DO

## 2016-07-01 NOTE — Patient Instructions (Signed)

## 2016-07-22 ENCOUNTER — Encounter: Payer: Self-pay | Admitting: Family Medicine

## 2016-07-22 ENCOUNTER — Ambulatory Visit (INDEPENDENT_AMBULATORY_CARE_PROVIDER_SITE_OTHER): Payer: 59 | Admitting: Family Medicine

## 2016-07-22 VITALS — BP 140/60 | HR 84 | Temp 98.1°F | Resp 16

## 2016-07-22 DIAGNOSIS — R109 Unspecified abdominal pain: Secondary | ICD-10-CM

## 2016-07-22 DIAGNOSIS — E039 Hypothyroidism, unspecified: Secondary | ICD-10-CM

## 2016-07-22 DIAGNOSIS — I10 Essential (primary) hypertension: Secondary | ICD-10-CM

## 2016-07-22 DIAGNOSIS — E785 Hyperlipidemia, unspecified: Secondary | ICD-10-CM | POA: Diagnosis not present

## 2016-07-22 DIAGNOSIS — E119 Type 2 diabetes mellitus without complications: Secondary | ICD-10-CM | POA: Diagnosis not present

## 2016-07-22 LAB — POC URINALSYSI DIPSTICK (AUTOMATED)
Bilirubin, UA: NEGATIVE
Blood, UA: NEGATIVE
GLUCOSE UA: NEGATIVE
Ketones, UA: NEGATIVE
LEUKOCYTES UA: NEGATIVE
Nitrite, UA: NEGATIVE
Protein, UA: NEGATIVE
Spec Grav, UA: 1.015 (ref 1.010–1.025)
UROBILINOGEN UA: 0.2 U/dL
pH, UA: 7 (ref 5.0–8.0)

## 2016-07-22 MED ORDER — AMLODIPINE BESYLATE 5 MG PO TABS: 5.0000 mg | ORAL_TABLET | Freq: Every day | ORAL | 1 refills | Status: DC

## 2016-07-22 MED ORDER — HYDROCHLOROTHIAZIDE 25 MG PO TABS: 25.0000 mg | ORAL_TABLET | Freq: Every day | ORAL | 3 refills | Status: DC

## 2016-07-22 MED FILL — HYDROCHLOROTHIAZIDE 25 MG T: 25 | 90 days supply | Qty: 90 | Fill #0

## 2016-07-22 NOTE — Progress Notes (Signed)
Patient ID: Michelle [name], female   DOB: [birth year], 61 y.o.   MRN: [id number]     Subjective:  I acted as a Education administrator for Dr. Carollee Herter.  Guerry Bruin, [place]   Patient ID: Michelle [name], female    DOB: 1955-07-02, 61 y.o.   MRN: [id number]  Chief Complaint  Patient presents with  . Hypertension  . Flank Pain    right side    HPI  Patient is in today for follow up blood pressure and has some right sided flank pain.  Flank pain she noticed more since her last visit.  She has been checking her blood pressure at home and the top numbers have been running in the 150s.  This morning blood pressure was 119/62.  Patient Care Team: Carollee Herter, Alferd Apa, DO as PCP - General (Family Medicine) Marica Otter, OD (Optometry)   Past Medical History:  Diagnosis Date  . Allergy   . Asthma   . Bronchitis   . Diabetes mellitus without complication ([place])   . Hyperlipidemia   . Hypertension   . Kidney stones   . Obesity   . Thyroid disease     Past Surgical History:  Procedure Laterality Date  . TONSILLECTOMY AND ADENOIDECTOMY  childhood  . TUBAL LIGATION    . VAGINAL HYSTERECTOMY  2011    Family History  Problem Relation Age of Onset  . Heart disease Mother   . Hyperlipidemia Mother   . Stroke Mother   . Hypertension Mother     Social History   Social History  . Marital status: Married    Spouse name: N/A  . Number of children: N/A  . Years of education: N/A   Occupational History  . RN    Social History Main Topics  . Smoking status: Never Smoker  . Smokeless tobacco: Never Used  . Alcohol use No  . Drug use: No  . Sexual activity: Yes   Other Topics Concern  . Not on file   Social History Narrative  . No narrative on file    Outpatient Medications Prior to Visit  Medication Sig Dispense Refill  . albuterol (PROVENTIL HFA;VENTOLIN HFA) 108 (90 BASE) MCG/ACT inhaler INhale 1 puffs into lungs every 8 hours prn wheezing or SOB 1 Inhaler 0  . ALPHA LIPOIC ACID  PO Take 1-2 capsules by mouth 2 (two) times daily.      . AMBULATORY NON FORMULARY MEDICATION Medication Name: Biest/Progesterone/Testosterone/DHEA 2.5/250/0.5/2mg     . aspirin 81 MG EC tablet Take 81 mg by mouth daily. Swallow whole.     . budesonide-formoterol (SYMBICORT) 80-4.5 MCG/ACT inhaler INHALE 2 PUFFS BY MOUTH INTO THE LUNGS TWICE A DAY 10.2 g 5  . Calcium-Magnesium (CAL-MAG PO) Take 1 tablet by mouth daily.    . Cholecalciferol (VITAMIN D3) 5000 UNITS CAPS Take 1 capsule by mouth daily.    . fluticasone (FLONASE) 50 MCG/ACT nasal spray PLACE 2 SPRAYS INTO BOTH NOSTRILS DAILY. 16 g 6  . metFORMIN (GLUCOPHAGE-XR) 500 MG 24 hr tablet Take 1 tablet by mouth daily.    . MULTIPLE VITAMIN PO Take 1 tablet by mouth daily.      Marland Kitchen PROBIOTIC CAPS Take 1 capsule by mouth daily.      Marland Kitchen amLODipine (NORVASC) 5 MG tablet Take 1 tablet (5 mg total) by mouth daily. 30 tablet 2   No facility-administered medications prior to visit.     Allergies  Allergen Reactions  . Shrimp [Shellfish Allergy] Itching  Allergic to shrimp.  Wheezing and tightness in chest.  . Tdap [Diphth-Acell Pertussis-Tetanus] Other (See Comments)    Change in level of consciousness  . Influenza Vaccine Live     Sever flu symptoms, chest tightness, wheezing  . Latex Rash    Review of Systems  Constitutional: Negative for fever and malaise/fatigue.  HENT: Negative for congestion.   Eyes: Negative for blurred vision.  Respiratory: Negative for cough and shortness of breath.   Cardiovascular: Negative for chest pain, palpitations and leg swelling.  Gastrointestinal: Negative for vomiting.  Genitourinary: Positive for flank pain.       Right side  Musculoskeletal: Negative for back pain.  Skin: Negative for rash.  Neurological: Negative for loss of consciousness and headaches.       Objective:    Physical Exam  Constitutional: She is oriented to person, place, and time. She appears well-developed and  well-nourished.  HENT:  Head: Normocephalic and atraumatic.  Eyes: Conjunctivae and EOM are normal.  Neck: Normal range of motion. Neck supple. No JVD present. Carotid bruit is not present. No thyromegaly present.  Cardiovascular: Normal rate, regular rhythm and normal heart sounds.   No murmur heard. Pulmonary/Chest: Effort normal and breath sounds normal. No respiratory distress. She has no wheezes. She has no rales. She exhibits no tenderness.  Musculoskeletal: She exhibits no edema.  Neurological: She is alert and oriented to person, place, and time.  Psychiatric: She has a normal mood and affect. Her behavior is normal. Judgment and thought content normal.  Nursing note and vitals reviewed.   BP 140/60   Pulse 84   Temp 98.1 F (36.7 C) (Oral)   Resp 16   SpO2 98%  Wt Readings from Last 3 Encounters:  02/24/16 168 lb 6.4 oz (76.4 kg)  02/08/16 169 lb (76.7 kg)  06/09/15 180 lb (81.6 kg)   BP Readings from Last 3 Encounters:  07/22/16 140/60  07/01/16 (!) 156/70  02/24/16 (!) 110/58      There is no immunization history on file for this patient.  Health Maintenance  Topic Date Due  . PNEUMOCOCCAL POLYSACCHARIDE VACCINE (1) 03/10/1955  . FOOT EXAM  03/10/1963  . HIV Screening  03/09/1968  . COLONOSCOPY  03/10/2003  . HEMOGLOBIN A1C  07/30/2015  . OPHTHALMOLOGY EXAM  02/26/2017  . MAMMOGRAM  04/01/2017  . PAP SMEAR  06/24/2017  . Hepatitis C Screening  Completed    Lab Results  Component Value Date   WBC 22.6 (H) 02/08/2016   HGB 12.8 02/08/2016   HCT 38.2 02/08/2016   PLT 243 02/08/2016   GLUCOSE 168 (H) 02/08/2016   CHOL 141 01/29/2015   TRIG 81.0 01/29/2015   HDL 47.40 01/29/2015   LDLCALC 77 01/29/2015   ALT 19 01/29/2015   AST 16 01/29/2015   NA 133 (L) 02/08/2016   K 3.8 02/08/2016   CL 100 (L) 02/08/2016   CREATININE 1.00 02/08/2016   BUN 13 02/08/2016   CO2 23 02/08/2016   TSH 2.703 10/01/2014   HGBA1C 5.7 01/29/2015    Lab Results    Component Value Date   TSH 2.703 10/01/2014   Lab Results  Component Value Date   WBC 22.6 (H) 02/08/2016   HGB 12.8 02/08/2016   HCT 38.2 02/08/2016   MCV 88.0 02/08/2016   PLT 243 02/08/2016   Lab Results  Component Value Date   NA 133 (L) 02/08/2016   K 3.8 02/08/2016   CO2 23 02/08/2016   GLUCOSE 168 (  H) 02/08/2016   BUN 13 02/08/2016   CREATININE 1.00 02/08/2016   BILITOT 0.5 01/29/2015   ALKPHOS 35 (L) 01/29/2015   AST 16 01/29/2015   ALT 19 01/29/2015   PROT 6.7 01/29/2015   ALBUMIN 4.0 01/29/2015   CALCIUM 9.3 02/08/2016   ANIONGAP 10 02/08/2016   GFR 88.69 01/29/2015   Lab Results  Component Value Date   CHOL 141 01/29/2015   Lab Results  Component Value Date   HDL 47.40 01/29/2015   Lab Results  Component Value Date   LDLCALC 77 01/29/2015   Lab Results  Component Value Date   TRIG 81.0 01/29/2015   Lab Results  Component Value Date   CHOLHDL 3 01/29/2015   Lab Results  Component Value Date   HGBA1C 5.7 01/29/2015         Assessment & Plan:   Problem List Items Addressed This Visit      Unprioritized   HTN (hypertension) - Primary   Relevant Medications   hydrochlorothiazide (HYDRODIURIL) 25 MG tablet   amLODipine (NORVASC) 5 MG tablet   Other Relevant Orders   Lipid panel   Comprehensive metabolic panel   Hyperlipidemia    Encouraged heart healthy diet, increase exercise, avoid trans fats, consider a krill oil cap daily      Relevant Medications   hydrochlorothiazide (HYDRODIURIL) 25 MG tablet   amLODipine (NORVASC) 5 MG tablet   Other Relevant Orders   Lipid panel   Comprehensive metabolic panel   Hypothyroidism    Poorly controlled will alter medications, encouraged DASH diet, minimize caffeine and obtain adequate sleep. Report concerning symptoms and follow up as directed and as needed       Other Visit Diagnoses    Right flank pain       Relevant Orders   POCT Urinalysis Dipstick (Automated) (Completed)    Controlled type 2 diabetes mellitus without complication, without long-term current use of insulin ([place])       Relevant Orders   Hemoglobin A1c      I am having Ms. Derringer start on hydrochlorothiazide. I am also having her maintain her aspirin, MULTIPLE VITAMIN PO, Probiotic, ALPHA LIPOIC ACID PO, AMBULATORY NON FORMULARY MEDICATION, Calcium-Magnesium (CAL-MAG PO), albuterol, Vitamin D3, fluticasone, metFORMIN, budesonide-formoterol, and amLODipine.  Meds ordered this encounter  Medications  . hydrochlorothiazide (HYDRODIURIL) 25 MG tablet    Sig: Take 1 tablet (25 mg total) by mouth daily.    Dispense:  30 tablet    Refill:  3  . amLODipine (NORVASC) 5 MG tablet    Sig: Take 1 tablet (5 mg total) by mouth daily.    Dispense:  90 tablet    Refill:  1   CMA served as Education administrator during this visit. History, Physical and Plan performed by medical provider. Documentation and orders reviewed and attested to.  [name], DO

## 2016-07-22 NOTE — Assessment & Plan Note (Addendum)
Poorly controlled will alter medications, encouraged DASH diet, minimize caffeine and obtain adequate sleep. Report concerning symptoms and follow up as directed and as needed 

## 2016-07-22 NOTE — Patient Instructions (Signed)

## 2016-07-22 NOTE — Assessment & Plan Note (Addendum)
Encouraged heart healthy diet, increase exercise, avoid trans fats, consider a krill oil cap daily 

## 2016-07-26 MED FILL — AMLODIPINE BESYLATE 5 MG TA: 5 | 90 days supply | Qty: 90 | Fill #0

## 2016-07-29 MED FILL — METFORMIN HCL ER 500 MG TAB: 500 | 90 days supply | Qty: 180 | Fill #1

## 2016-08-02 MED FILL — FLUTICASONE PROP 50 MCG SPR: 50 | 30 days supply | Qty: 16 | Fill #6

## 2016-08-13 MED FILL — NATURE-THROID 32.5 MG TAB: 32.5 | 30 days supply | Qty: 30 | Fill #1

## 2016-08-16 ENCOUNTER — Encounter: Payer: 59 | Admitting: Family Medicine

## 2016-08-19 MED FILL — SYMBICORT 80-4.5 MCG INH: 80-4.5 | 30 days supply | Qty: 10 | Fill #1

## 2016-08-20 ENCOUNTER — Other Ambulatory Visit (INDEPENDENT_AMBULATORY_CARE_PROVIDER_SITE_OTHER): Payer: 59

## 2016-08-20 DIAGNOSIS — I10 Essential (primary) hypertension: Secondary | ICD-10-CM

## 2016-08-20 DIAGNOSIS — E119 Type 2 diabetes mellitus without complications: Secondary | ICD-10-CM

## 2016-08-20 DIAGNOSIS — E785 Hyperlipidemia, unspecified: Secondary | ICD-10-CM

## 2016-08-20 LAB — LIPID PANEL
CHOL/HDL RATIO: 4
Cholesterol: 202 mg/dL — ABNORMAL HIGH (ref 0–200)
HDL: 46.4 mg/dL (ref >39.00)
LDL Cholesterol: 139 mg/dL — ABNORMAL HIGH (ref 0–99)
NONHDL: 155.7
Triglycerides: 85 mg/dL (ref 0.0–149.0)
VLDL: 17 mg/dL (ref 0.0–40.0)

## 2016-08-20 LAB — COMPREHENSIVE METABOLIC PANEL
ALT: 16 U/L (ref 0–35)
AST: 15 U/L (ref 0–37)
Albumin: 4.1 g/dL (ref 3.5–5.2)
Alkaline Phosphatase: 35 U/L — ABNORMAL LOW (ref 39–117)
BUN: 22 mg/dL (ref 6–23)
CO2: 32 meq/L (ref 19–32)
Calcium: 9.6 mg/dL (ref 8.4–10.5)
Chloride: 101 mEq/L (ref 96–112)
Creatinine, Ser: 0.82 mg/dL (ref 0.40–1.20)
GFR: 74.73 mL/min (ref >60.00)
GLUCOSE: 101 mg/dL — AB (ref 70–99)
POTASSIUM: 3.8 meq/L (ref 3.5–5.1)
Sodium: 139 mEq/L (ref 135–145)
TOTAL PROTEIN: 7.1 g/dL (ref 6.0–8.3)
Total Bilirubin: 0.7 mg/dL (ref 0.2–1.2)

## 2016-08-20 LAB — HEMOGLOBIN A1C: HEMOGLOBIN A1C: 5.4 % (ref 4.6–6.5)

## 2016-08-23 ENCOUNTER — Encounter: Payer: 59 | Admitting: Family Medicine

## 2016-08-26 ENCOUNTER — Other Ambulatory Visit: Payer: Self-pay | Admitting: Family Medicine

## 2016-08-26 ENCOUNTER — Telehealth: Payer: Self-pay | Admitting: Family Medicine

## 2016-08-26 DIAGNOSIS — E785 Hyperlipidemia, unspecified: Secondary | ICD-10-CM

## 2016-08-26 NOTE — Telephone Encounter (Signed)
Pt was returning call to get her lab results, pt states that her battery is low for her cell [phone] - if she does not answer to please leave a detail message on her cell phone or to call her husband at [phone].

## 2016-09-06 ENCOUNTER — Encounter: Payer: Self-pay | Admitting: Gastroenterology

## 2016-09-06 ENCOUNTER — Ambulatory Visit (INDEPENDENT_AMBULATORY_CARE_PROVIDER_SITE_OTHER): Payer: 59 | Admitting: Family Medicine

## 2016-09-06 ENCOUNTER — Encounter: Payer: Self-pay | Admitting: Family Medicine

## 2016-09-06 VITALS — BP 144/78 | HR 105 | Temp 98.6°F | Resp 16 | Ht 63.0 in

## 2016-09-06 DIAGNOSIS — E785 Hyperlipidemia, unspecified: Secondary | ICD-10-CM | POA: Diagnosis not present

## 2016-09-06 DIAGNOSIS — E119 Type 2 diabetes mellitus without complications: Secondary | ICD-10-CM | POA: Insufficient documentation

## 2016-09-06 DIAGNOSIS — Z0000 Encounter for general adult medical examination without abnormal findings: Secondary | ICD-10-CM

## 2016-09-06 DIAGNOSIS — E118 Type 2 diabetes mellitus with unspecified complications: Secondary | ICD-10-CM | POA: Insufficient documentation

## 2016-09-06 DIAGNOSIS — E039 Hypothyroidism, unspecified: Secondary | ICD-10-CM | POA: Diagnosis not present

## 2016-09-06 MED ORDER — SIMVASTATIN 20 MG PO TABS: 20.0000 mg | ORAL_TABLET | Freq: Every evening | ORAL | 1 refills | Status: DC

## 2016-09-06 MED FILL — SIMVASTATIN 20 MG TABLET: 20 | 90 days supply | Qty: 90 | Fill #0

## 2016-09-06 NOTE — Assessment & Plan Note (Signed)
Tolerating statin, encouraged heart healthy diet, avoid trans fats, minimize simple carbs and saturated fats. Increase exercise as tolerated 

## 2016-09-06 NOTE — Patient Instructions (Signed)
Preventive Care 40-64 Years, Female Preventive care refers to lifestyle choices and visits with your health care provider that can promote health and wellness. What does preventive care include?  A yearly physical exam. This is also called an annual well check.  Dental exams once or twice a year.  Routine eye exams. Ask your health care provider how often you should have your eyes checked.  Personal lifestyle choices, including: ? Daily care of your teeth and gums. ? Regular physical activity. ? Eating a healthy diet. ? Avoiding tobacco and drug use. ? Limiting alcohol use. ? Practicing safe sex. ? Taking low-dose aspirin daily starting at age 58. ? Taking vitamin and mineral supplements as recommended by your health care provider. What happens during an annual well check? The services and screenings done by your health care provider during your annual well check will depend on your age, overall health, lifestyle risk factors, and family history of disease. Counseling Your health care provider may ask you questions about your:  Alcohol use.  Tobacco use.  Drug use.  Emotional well-being.  Home and relationship well-being.  Sexual activity.  Eating habits.  Work and work Statistician.  Method of birth control.  Menstrual cycle.  Pregnancy history.  Screening You may have the following tests or measurements:  Height, weight, and BMI.  Blood pressure.  Lipid and cholesterol levels. These may be checked every 5 years, or more frequently if you are over 81 years old.  Skin check.  Lung cancer screening. You may have this screening every year starting at age 78 if you have a 30-pack-year history of smoking and currently smoke or have quit within the past 15 years.  Fecal occult blood test (FOBT) of the stool. You may have this test every year starting at age 65.  Flexible sigmoidoscopy or colonoscopy. You may have a sigmoidoscopy every 5 years or a colonoscopy  every 10 years starting at age 30.  Hepatitis C blood test.  Hepatitis B blood test.  Sexually transmitted disease (STD) testing.  Diabetes screening. This is done by checking your blood sugar (glucose) after you have not eaten for a while (fasting). You may have this done every 1-3 years.  Mammogram. This may be done every 1-2 years. Talk to your health care provider about when you should start having regular mammograms. This may depend on whether you have a family history of breast cancer.  BRCA-related cancer screening. This may be done if you have a family history of breast, ovarian, tubal, or peritoneal cancers.  Pelvic exam and Pap test. This may be done every 3 years starting at age 80. Starting at age 36, this may be done every 5 years if you have a Pap test in combination with an HPV test.  Bone density scan. This is done to screen for osteoporosis. You may have this scan if you are at high risk for osteoporosis.  Discuss your test results, treatment options, and if necessary, the need for more tests with your health care provider. Vaccines Your health care provider may recommend certain vaccines, such as:  Influenza vaccine. This is recommended every year.  Tetanus, diphtheria, and acellular pertussis (Tdap, Td) vaccine. You may need a Td booster every 10 years.  Varicella vaccine. You may need this if you have not been vaccinated.  Zoster vaccine. You may need this after age 5.  Measles, mumps, and rubella (MMR) vaccine. You may need at least one dose of MMR if you were born in  1959 or later. You may also need a second dose.  Pneumococcal 13-valent conjugate (PCV13) vaccine. You may need this if you have certain conditions and were not previously vaccinated.  Pneumococcal polysaccharide (PPSV23) vaccine. You may need one or two doses if you smoke cigarettes or if you have certain conditions.  Meningococcal vaccine. You may need this if you have certain  conditions.  Hepatitis A vaccine. You may need this if you have certain conditions or if you travel or work in places where you may be exposed to hepatitis A.  Hepatitis B vaccine. You may need this if you have certain conditions or if you travel or work in places where you may be exposed to hepatitis B.  Haemophilus influenzae type b (Hib) vaccine. You may need this if you have certain conditions.  Talk to your health care provider about which screenings and vaccines you need and how often you need them. This information is not intended to replace advice given to you by your health care provider. Make sure you discuss any questions you have with your health care provider. Document Released: 02/28/2015 Document Revised: 10/22/2015 Document Reviewed: 12/03/2014 Elsevier Interactive Patient Education  2017 [employer].

## 2016-09-06 NOTE — Progress Notes (Signed)
Subjective:   I acted as a Education administrator for Dr. Carollee Herter.  Michelle [name], CMA   MARICSA Michelle [name] is a 61 y.o. female and is here for a comprehensive physical exam. The patient reports no problems.  Social History   Social History  . Marital status: Married    Spouse name: N/A  . Number of children: N/A  . Years of education: N/A   Occupational History  . RN    Social History Main Topics  . Smoking status: Never Smoker  . Smokeless tobacco: Never Used  . Alcohol use No  . Drug use: No  . Sexual activity: Yes   Other Topics Concern  . Not on file   Social History Narrative   Exercise- Walks about an hour a day      Health Maintenance  Topic Date Due  . PNEUMOCOCCAL POLYSACCHARIDE VACCINE (1) 03/10/1955  . FOOT EXAM  03/10/1963  . URINE MICROALBUMIN  03/10/1963  . HIV Screening  03/09/1968  . COLONOSCOPY  03/10/2003  . HEMOGLOBIN A1C  02/20/2017  . OPHTHALMOLOGY EXAM  02/26/2017  . MAMMOGRAM  04/01/2017  . PAP SMEAR  06/24/2017  . Hepatitis C Screening  Completed    The following portions of the patient's history were reviewed and updated as appropriate:  She  has a past medical history of Allergy; Asthma; Bronchitis; Diabetes mellitus without complication ([place]); Hyperlipidemia; Hypertension; Kidney stones; Obesity; and Thyroid disease. She  does not have any pertinent problems on file. She  has a past surgical history that includes Tonsillectomy and adenoidectomy (childhood); Tubal ligation; and Vaginal hysterectomy (2011). Her family history includes Heart disease in her mother; Hyperlipidemia in her mother; Hypertension in her mother; Stroke in her mother. She  reports that she has never smoked. She has never used smokeless tobacco. She reports that she does not drink alcohol or use drugs. She has a current medication list which includes the following prescription(s): albuterol, alpha-lipoic acid, AMBULATORY NON FORMULARY MEDICATION, amlodipine, aspirin,  budesonide-formoterol, calcium-magnesium, vitamin d3, fluticasone, hydrochlorothiazide, metformin, multiple vitamin, nature-throid, probiotic, and simvastatin. Current Outpatient Prescriptions on File Prior to Visit  Medication Sig Dispense Refill  . albuterol (PROVENTIL HFA;VENTOLIN HFA) 108 (90 BASE) MCG/ACT inhaler INhale 1 puffs into lungs every 8 hours prn wheezing or SOB 1 Inhaler 0  . ALPHA LIPOIC ACID PO Take 1-2 capsules by mouth 2 (two) times daily.      . AMBULATORY NON FORMULARY MEDICATION Medication Name: Biest/Progesterone/Testosterone/DHEA 2.5/250/0.5/2mg     . amLODipine (NORVASC) 5 MG tablet Take 1 tablet (5 mg total) by mouth daily. 90 tablet 1  . aspirin 81 MG EC tablet Take 81 mg by mouth daily. Swallow whole.     . budesonide-formoterol (SYMBICORT) 80-4.5 MCG/ACT inhaler INHALE 2 PUFFS BY MOUTH INTO THE LUNGS TWICE A DAY 10.2 g 5  . Calcium-Magnesium (CAL-MAG PO) Take 1 tablet by mouth daily.    . Cholecalciferol (VITAMIN D3) 5000 UNITS CAPS Take 1 capsule by mouth daily.    . fluticasone (FLONASE) 50 MCG/ACT nasal spray PLACE 2 SPRAYS INTO BOTH NOSTRILS DAILY. 16 g 6  . hydrochlorothiazide (HYDRODIURIL) 25 MG tablet Take 1 tablet (25 mg total) by mouth daily. 30 tablet 3  . metFORMIN (GLUCOPHAGE-XR) 500 MG 24 hr tablet Take 1 tablet by mouth 2 (two) times daily.     . MULTIPLE VITAMIN PO Take 1 tablet by mouth daily.      Marland Kitchen PROBIOTIC CAPS Take 1 capsule by mouth daily.       No  current facility-administered medications on file prior to visit.    She is allergic to shrimp [shellfish allergy]; tdap [diphth-acell pertussis-tetanus]; influenza vaccine live; and latex..  Review of Systems Review of Systems  Constitutional: Negative for activity change, appetite change and fatigue.  HENT: Negative for hearing loss, congestion, tinnitus and ear discharge.  dentist q53m Eyes: Negative for visual disturbance (see optho q1y -- vision corrected to 20/20 with glasses).  Respiratory:  Negative for cough, chest tightness and shortness of breath.   Cardiovascular: Negative for chest pain, palpitations and leg swelling.  Gastrointestinal: Negative for abdominal pain, diarrhea, constipation and abdominal distention.  Genitourinary: Negative for urgency, frequency, decreased urine volume and difficulty urinating.  Musculoskeletal: Negative for back pain, arthralgias and gait problem.  Skin: Negative for color change, pallor and rash.  Neurological: Negative for dizziness, light-headedness, numbness and headaches.  Hematological: Negative for adenopathy. Does not bruise/bleed easily.  Psychiatric/Behavioral: Negative for suicidal ideas, confusion, sleep disturbance, self-injury, dysphoric mood, decreased concentration and agitation.       Objective:    BP (!) 144/78   Pulse (!) 105   Temp 98.6 F (37 C) (Oral)   Resp 16   Ht 5\' 3"  (1.6 m)   SpO2 97%  General appearance: alert, cooperative, appears stated age and no distress Head: Normocephalic, without obvious abnormality, atraumatic Eyes: conjunctivae/corneas clear. PERRL, EOM's intact. Fundi benign. Ears: normal TM's and external ear canals both ears Nose: Nares normal. Septum midline. Mucosa normal. No drainage or sinus tenderness. Throat: lips, mucosa, and tongue normal; teeth and gums normal Neck: no adenopathy, no carotid bruit, no JVD, supple, symmetrical, trachea midline and thyroid not enlarged, symmetric, no tenderness/mass/nodules Back: symmetric, no curvature. ROM normal. No CVA tenderness. Lungs: clear to auscultation bilaterally Breasts: gyn Heart: regular rate and rhythm, S1, S2 normal, no murmur, click, rub or gallop Abdomen: soft, non-tender; bowel sounds normal; no masses,  no organomegaly Pelvic: deferred--gyn Extremities: extremities normal, atraumatic, no cyanosis or edema Pulses: 2+ and symmetric Skin: Skin color, texture, turgor normal. No rashes or lesions Lymph nodes: Cervical,  supraclavicular, and axillary nodes normal. Neurologic: Alert and oriented X 3, normal strength and tone. Normal symmetric reflexes. Normal coordination and gait    Assessment:    Healthy female exam.      Plan:  Check labs ghm utd See After Visit Summary for Counseling Recommendations    1. Hyperlipidemia LDL goal <100 Tolerating statin, encouraged heart healthy diet, avoid trans fats, minimize simple carbs and saturated fats. Increase exercise as tolerated  - simvastatin (ZOCOR) 20 MG tablet; Take 1 tablet (20 mg total) by mouth every evening.  Dispense: 90 tablet; Refill: 1  2. Preventative health care See above - Ambulatory referral to Gastroenterology  3. Hyperlipidemia, unspecified hyperlipidemia type    4. Hypothyroidism, unspecified type con't meds Check labs  5. Diabetes mellitus without complication ([place]) Check labs con't metformin

## 2016-09-06 NOTE — Assessment & Plan Note (Signed)
ghm utd Check labs D/w pt shingrix See AVS 

## 2016-09-06 NOTE — Assessment & Plan Note (Signed)
con't meds  Check labs 

## 2016-09-17 ENCOUNTER — Other Ambulatory Visit: Payer: Self-pay | Admitting: Family Medicine

## 2016-09-17 DIAGNOSIS — J0140 Acute pansinusitis, unspecified: Secondary | ICD-10-CM

## 2016-09-17 MED FILL — FLUTICASONE PROP 50 MCG SPR: 50 | 30 days supply | Qty: 16 | Fill #0

## 2016-10-01 MED FILL — NATURE-THROID 32.5 MG TAB: 32.5 | 30 days supply | Qty: 30 | Fill #2

## 2016-10-05 MED FILL — HYDROCHLOROTHIAZIDE 25 MG T: 25 | 30 days supply | Qty: 30 | Fill #1

## 2016-10-05 MED FILL — SYMBICORT 80-4.5 MCG INH: 80-4.5 | 30 days supply | Qty: 10 | Fill #2

## 2016-10-19 ENCOUNTER — Ambulatory Visit (INDEPENDENT_AMBULATORY_CARE_PROVIDER_SITE_OTHER): Payer: 59 | Admitting: Family Medicine

## 2016-10-19 ENCOUNTER — Encounter: Payer: Self-pay | Admitting: Family Medicine

## 2016-10-19 DIAGNOSIS — I10 Essential (primary) hypertension: Secondary | ICD-10-CM

## 2016-10-19 MED ORDER — PROPRANOLOL HCL 10 MG PO TABS: ORAL_TABLET | ORAL | 2 refills | Status: DC

## 2016-10-19 MED FILL — AMLODIPINE BESYLATE 5 MG TA: 5 | 90 days supply | Qty: 90 | Fill #1

## 2016-10-19 MED FILL — FLUTICASONE PROP 50 MCG SPR: 50 | 30 days supply | Qty: 16 | Fill #1

## 2016-10-19 MED FILL — PROPRANOLOL 10 MG TABLET: 10 | 7 days supply | Qty: 30 | Fill #0

## 2016-10-19 NOTE — Progress Notes (Signed)
Patient ID: Michelle [name], female    DOB: 03-Mar-1955  Age: 61 y.o. MRN: [id number]    Subjective:  Subjective  HPI CHARMANE PROTZMAN presents for f/u bp.  She is going down to 2 , 8 hour shifts in Jan and retire 1 year for from Jan, 2020.   No complaints.    Review of Systems  Constitutional: Negative for activity change, appetite change, fatigue and unexpected weight change.  Respiratory: Negative for cough and shortness of breath.   Cardiovascular: Negative for chest pain and palpitations.  Psychiatric/Behavioral: Negative for behavioral problems and dysphoric mood. The patient is not nervous/anxious.     History Past Medical History:  Diagnosis Date  . Allergy   . Asthma   . Bronchitis   . Diabetes mellitus without complication ([place])   . Hyperlipidemia   . Hypertension   . Kidney stones   . Obesity   . Thyroid disease     She has a past surgical history that includes Tonsillectomy and adenoidectomy (childhood); Tubal ligation; and Vaginal hysterectomy (2011).   Her family history includes Heart disease in her mother; Hyperlipidemia in her mother; Hypertension in her mother; Stroke in her mother.She reports that she has never smoked. She has never used smokeless tobacco. She reports that she does not drink alcohol or use drugs.  Current Outpatient Prescriptions on File Prior to Visit  Medication Sig Dispense Refill  . albuterol (PROVENTIL HFA;VENTOLIN HFA) 108 (90 BASE) MCG/ACT inhaler INhale 1 puffs into lungs every 8 hours prn wheezing or SOB 1 Inhaler 0  . ALPHA LIPOIC ACID PO Take 1-2 capsules by mouth 2 (two) times daily.      . AMBULATORY NON FORMULARY MEDICATION Medication Name: Biest/Progesterone/Testosterone/DHEA 2.5/250/0.5/2mg     . amLODipine (NORVASC) 5 MG tablet Take 1 tablet (5 mg total) by mouth daily. 90 tablet 1  . aspirin 81 MG EC tablet Take 81 mg by mouth daily. Swallow whole.     . budesonide-formoterol (SYMBICORT) 80-4.5 MCG/ACT inhaler INHALE 2  PUFFS BY MOUTH INTO THE LUNGS TWICE A DAY 10.2 g 5  . Calcium-Magnesium (CAL-MAG PO) Take 1 tablet by mouth daily.    . Cholecalciferol (VITAMIN D3) 5000 UNITS CAPS Take 1 capsule by mouth daily.    . fluticasone (FLONASE) 50 MCG/ACT nasal spray PLACE 2 SPRAYS INTO BOTH NOSTRILS DAILY. 16 g 6  . hydrochlorothiazide (HYDRODIURIL) 25 MG tablet Take 1 tablet (25 mg total) by mouth daily. 30 tablet 3  . metFORMIN (GLUCOPHAGE-XR) 500 MG 24 hr tablet Take 1 tablet by mouth 2 (two) times daily.     . MULTIPLE VITAMIN PO Take 1 tablet by mouth daily.      Marland Kitchen NATURE-THROID 32.5 MG tablet Take 1 tablet by mouth daily.  3  . PROBIOTIC CAPS Take 1 capsule by mouth daily.      . simvastatin (ZOCOR) 20 MG tablet Take 1 tablet (20 mg total) by mouth every evening. 90 tablet 1   No current facility-administered medications on file prior to visit.      Objective:  Objective  Physical Exam  Constitutional: She is oriented to person, place, and time. She appears well-developed and well-nourished.  HENT:  Head: Normocephalic and atraumatic.  Eyes: Conjunctivae and EOM are normal.  Neck: Normal range of motion. Neck supple. No JVD present. Carotid bruit is not present. No thyromegaly present.  Cardiovascular: Normal rate, regular rhythm and normal heart sounds.   No murmur heard. Pulmonary/Chest: Effort normal and breath sounds normal.  No respiratory distress. She has no wheezes. She has no rales. She exhibits no tenderness.  Musculoskeletal: She exhibits no edema.  Neurological: She is alert and oriented to person, place, and time.  Psychiatric: She has a normal mood and affect. Her behavior is normal. Judgment and thought content normal.  Nursing note and vitals reviewed.  BP 124/68 (BP Location: Right Arm, Patient Position: Sitting, Cuff Size: Normal)   Pulse 96   Temp 97.7 F (36.5 C) (Oral)   Ht 5\' 3"  (1.6 m)   Wt 143 lb 3.2 oz (65 kg)   SpO2 97%   BMI 25.37 kg/m  Wt Readings from Last 3  Encounters:  10/19/16 143 lb 3.2 oz (65 kg)  02/24/16 168 lb 6.4 oz (76.4 kg)  02/08/16 169 lb (76.7 kg)     Lab Results  Component Value Date   WBC 22.6 (H) 02/08/2016   HGB 12.8 02/08/2016   HCT 38.2 02/08/2016   PLT 243 02/08/2016   GLUCOSE 101 (H) 08/20/2016   CHOL 202 (H) 08/20/2016   TRIG 85.0 08/20/2016   HDL 46.40 08/20/2016   LDLCALC 139 (H) 08/20/2016   ALT 16 08/20/2016   AST 15 08/20/2016   NA 139 08/20/2016   K 3.8 08/20/2016   CL 101 08/20/2016   CREATININE 0.82 08/20/2016   BUN 22 08/20/2016   CO2 32 08/20/2016   TSH 2.703 10/01/2014   HGBA1C 5.4 08/20/2016    Dg Bone Density (dxa)  Result Date: 04/01/2016 EXAM: DUAL X-RAY ABSORPTIOMETRY (DXA) FOR BONE MINERAL DENSITY IMPRESSION: Referring Physician:  Seymour Bars HAYGOOD PATIENT: Name: Cara, Thaxton Patient ID: [id number] Birth Date: [birth year] Height: 63.2 in. Sex: Female Measured: 04/01/2016 Weight: 168.4 lbs. Indications: Caucasian, Diabetic, Family History of Osteoporosis, Follow up Osteopenia, History of Osteoporosis, Hypothyroidism, Hysterectomy, Post-Menopausal Fractures: Humerus Treatments: Calcium, Estrogen/Hormone Therapy, Multivitamin, Vitamin D ASSESSMENT: The BMD measured at AP Spine L1-L4 is 1.179 g/cm2 with a T-score of -0.1. This patient is considered NORMAL according to [facility]) criteria. Site Region Measured Date Measured Age WHO YA BMD Classification T-score AP Spine L1-L4 04/01/2016 [age] Normal -0.1 1.179 g/cm2 DualFemur Neck Left 2/01/2016 [age] Normal 0.0 1.041 g/cm2 World Health Organization [facility]) criteria for post-menopausal, Caucasian Women: Normal       T-score at or above -1 SD Osteopenia   T-score between -1 and -2.5 SD Osteoporosis T-score at or below -2.5 SD RECOMMENDATION: [place] recommends that FDA-approved medical therapies be considered in postmenopausal women and men age 49 or older with a: 1. Hip or vertebral (clinical or  morphometric) fracture. 2. T-score of < -2.5 at the spine or hip. 3. Ten-year fracture probability by FRAX of 3% or greater for hip fracture or 20% or greater for major osteoporotic fracture. All treatment decisions require clinical judgment and consideration of individual patient factors, including patient preferences, co-morbidities, previous drug use, risk factors not captured in the FRAX model (e.g. falls, vitamin D deficiency, increased bone turnover, interval significant decline in bone density) and possible under - or over-estimation of fracture risk by FRAX. All patients should ensure an adequate intake of dietary calcium (1200 mg/d) and vitamin D (800 IU daily) unless contraindicated. FOLLOW-UP: People with diagnosed cases of osteoporosis or at high risk for fracture should have regular bone mineral density tests. For patients eligible for Medicare, routine testing is allowed once every 2 years. The testing frequency can be increased to one year for patients who have rapidly progressing disease, those who  are receiving or discontinuing medical therapy to restore bone mass, or have additional risk factors. I have reviewed this report and agree with the above findings. Mark A. Thornton Papas, M.D. [facility] Electronically Signed   By: Lavonia Dana M.D.   On: 04/01/2016 10:34   Mm Screening Breast Tomo Bilateral  Result Date: 04/01/2016 CLINICAL DATA:  Screening. EXAM: 2D DIGITAL SCREENING BILATERAL MAMMOGRAM WITH CAD AND ADJUNCT TOMO COMPARISON:  Previous exam(s). ACR Breast Density Category b: There are scattered areas of fibroglandular density. FINDINGS: There are no findings suspicious for malignancy. Images were processed with CAD. IMPRESSION: No mammographic evidence of malignancy. A result letter of this screening mammogram will be mailed directly to the patient. RECOMMENDATION: Screening mammogram in one year. (Code:SM-B-01Y) BI-RADS CATEGORY  1: Negative. Electronically Signed   By: Fidela Salisbury M.D.   On: 04/01/2016 16:02     Assessment & Plan:  Plan  I am having Ms. Shell start on propranolol. I am also having her maintain her aspirin, MULTIPLE VITAMIN PO, Probiotic, ALPHA LIPOIC ACID PO, AMBULATORY NON FORMULARY MEDICATION, Calcium-Magnesium (CAL-MAG PO), albuterol, Vitamin D3, metFORMIN, budesonide-formoterol, hydrochlorothiazide, amLODipine, NATURE-THROID, simvastatin, and fluticasone.  Meds ordered this encounter  Medications  . propranolol (INDERAL) 10 MG tablet    Sig: 1 po tid prn htn    Dispense:  30 tablet    Refill:  2    Problem List Items Addressed This Visit      Unprioritized   HTN (hypertension)    Stress related She has cut down on caffeine and is cutting down on schedule at work Whenever she is not working it is perfect       Relevant Medications   propranolol (INDERAL) 10 MG tablet      Follow-up: Return in about 3 months (around 01/18/2017) for hypertension.  [name], DO

## 2016-10-19 NOTE — Assessment & Plan Note (Signed)
Stress related She has cut down on caffeine and is cutting down on schedule at work Whenever she is not working it is perfect

## 2016-10-19 NOTE — Patient Instructions (Signed)

## 2016-10-27 ENCOUNTER — Ambulatory Visit (AMBULATORY_SURGERY_CENTER): Payer: Self-pay

## 2016-10-27 VITALS — Ht 63.5 in | Wt 147.6 lb

## 2016-10-27 DIAGNOSIS — Z8371 Family history of colonic polyps: Secondary | ICD-10-CM

## 2016-10-27 MED ORDER — NA SULFATE-K SULFATE-MG SULF 17.5-3.13-1.6 GM/177ML PO SOLN: ORAL | 0 refills | Status: DC

## 2016-10-27 NOTE — Progress Notes (Signed)
Per pt, no allergies to soy or egg products.Pt not taking any weight loss meds or using  O2 at home    Emmi video was sent to patients email.

## 2016-10-28 ENCOUNTER — Encounter: Payer: Self-pay | Admitting: Gastroenterology

## 2016-11-01 ENCOUNTER — Other Ambulatory Visit: Payer: Self-pay | Admitting: Family Medicine

## 2016-11-01 MED FILL — METFORMIN HCL ER 500 MG TAB: 500 | 90 days supply | Qty: 180 | Fill #0

## 2016-11-04 MED FILL — NATURE-THROID 32.5 MG TAB: 32.5 | 30 days supply | Qty: 30 | Fill #3

## 2016-11-04 MED FILL — SUPREP BOWEL PREP KIT: 17.5-3.13-1 | 1 days supply | Qty: 354 | Fill #0

## 2016-11-10 ENCOUNTER — Ambulatory Visit (AMBULATORY_SURGERY_CENTER): Payer: 59 | Admitting: Gastroenterology

## 2016-11-10 ENCOUNTER — Encounter: Payer: Self-pay | Admitting: Gastroenterology

## 2016-11-10 VITALS — BP 127/71 | HR 77 | Temp 99.1°F | Resp 11 | Ht 63.5 in | Wt 147.0 lb

## 2016-11-10 DIAGNOSIS — Z1212 Encounter for screening for malignant neoplasm of rectum: Secondary | ICD-10-CM | POA: Diagnosis not present

## 2016-11-10 DIAGNOSIS — I10 Essential (primary) hypertension: Secondary | ICD-10-CM | POA: Diagnosis not present

## 2016-11-10 DIAGNOSIS — Z1211 Encounter for screening for malignant neoplasm of colon: Secondary | ICD-10-CM

## 2016-11-10 DIAGNOSIS — E119 Type 2 diabetes mellitus without complications: Secondary | ICD-10-CM | POA: Diagnosis not present

## 2016-11-10 DIAGNOSIS — J449 Chronic obstructive pulmonary disease, unspecified: Secondary | ICD-10-CM | POA: Diagnosis not present

## 2016-11-10 MED ORDER — SODIUM CHLORIDE 0.9 % IV SOLN: 500.0000 mL | INTRAVENOUS | Status: DC

## 2016-11-10 NOTE — Op Note (Signed)
[place] Patient Name: Michelle [name] Procedure Date: 11/10/2016 9:20 AM MRN: [id number] Endoscopist: Mauri Pole , MD Age: 61 Referring MD:  Date of Birth: 1956-01-07 Gender: Female Account #: [id number] Procedure:                Colonoscopy Indications:              Screening for colorectal malignant neoplasm Medicines:                Monitored Anesthesia Care Procedure:                Pre-Anesthesia Assessment:                           - Prior to the procedure, a History and Physical                            was performed, and patient medications and                            allergies were reviewed. The patient's tolerance of                            previous anesthesia was also reviewed. The risks                            and benefits of the procedure and the sedation                            options and risks were discussed with the patient.                            All questions were answered, and informed consent                            was obtained. Prior Anticoagulants: The patient has                            taken no previous anticoagulant or antiplatelet                            agents. ASA Grade Assessment: II - A patient with                            mild systemic disease. After reviewing the risks                            and benefits, the patient was deemed in                            satisfactory condition to undergo the procedure.                           After obtaining informed consent, the colonoscope  was passed under direct vision. Throughout the                            procedure, the patient's blood pressure, pulse, and                            oxygen saturations were monitored continuously. The                            Colonoscope was introduced through the anus and                            advanced to the the terminal ileum, with                            identification of  the appendiceal orifice and IC                            valve. The colonoscopy was performed without                            difficulty. The patient tolerated the procedure                            well. The quality of the bowel preparation was                            excellent. The terminal ileum, ileocecal valve,                            appendiceal orifice, and rectum were photographed. Scope In: 9:24:18 AM Scope Out: 9:41:51 AM Scope Withdrawal Time: 0 hours 11 minutes 20 seconds  Total Procedure Duration: 0 hours 17 minutes 33 seconds  Findings:                 The perianal and digital rectal examinations were                            normal.                           A patchy area of mild melanosis was found in the                            transverse colon, in the ascending colon and in the                            cecum.                           A few small-mouthed diverticula were found in the                            sigmoid colon.  Non-bleeding internal hemorrhoids were found during                            retroflexion. The hemorrhoids were small.                           The exam was otherwise without abnormality. Complications:            No immediate complications. Estimated Blood Loss:     Estimated blood loss: none. Impression:               - Melanosis in the colon.                           - Diverticulosis in the sigmoid colon.                           - Non-bleeding internal hemorrhoids.                           - The examination was otherwise normal.                           - No specimens collected. Recommendation:           - Patient has a contact number available for                            emergencies. The signs and symptoms of potential                            delayed complications were discussed with the                            patient. Return to normal activities tomorrow.                             Written discharge instructions were provided to the                            patient.                           - Resume previous diet.                           - Continue present medications.                           - Await pathology results.                           - Repeat colonoscopy in 10 years for screening                            purposes. Mauri Pole, MD 11/10/2016 9:46:01 AM This report has been signed electronically.

## 2016-11-10 NOTE — Progress Notes (Signed)
Report to PACU, RN, vss, BBS= Clear.  

## 2016-11-10 NOTE — Patient Instructions (Signed)
Discharge instructions given. Handouts on diverticulosis and hemorrhoids. Resume previous medications. YOU HAD AN ENDOSCOPIC PROCEDURE TODAY AT THE [facility]:   Refer to the procedure report that was given to you for any specific questions about what was found during the examination.  If the procedure report does not answer your questions, please call your gastroenterologist to clarify.  If you requested that your care partner not be given the details of your procedure findings, then the procedure report has been included in a sealed envelope for you to review at your convenience later.  YOU SHOULD EXPECT: Some feelings of bloating in the abdomen. Passage of more gas than usual.  Walking can help get rid of the air that was put into your GI tract during the procedure and reduce the bloating. If you had a lower endoscopy (such as a colonoscopy or flexible sigmoidoscopy) you may notice spotting of blood in your stool or on the toilet paper. If you underwent a bowel prep for your procedure, you may not have a normal bowel movement for a few days.  Please Note:  You might notice some irritation and congestion in your nose or some drainage.  This is from the oxygen used during your procedure.  There is no need for concern and it should clear up in a day or so.  SYMPTOMS TO REPORT IMMEDIATELY:   Following lower endoscopy (colonoscopy or flexible sigmoidoscopy):  Excessive amounts of blood in the stool  Significant tenderness or worsening of abdominal pains  Swelling of the abdomen that is new, acute  Fever of 100F or higher   For urgent or emergent issues, a gastroenterologist can be reached at any hour by calling [phone].   DIET:  We do recommend a small meal at first, but then you may proceed to your regular diet.  Drink plenty of fluids but you should avoid alcoholic beverages for 24 hours.  ACTIVITY:  You should plan to take it easy for the rest of today and you should  NOT DRIVE or use heavy machinery until tomorrow (because of the sedation medicines used during the test).    FOLLOW UP: Our staff will call the number listed on your records the next business day following your procedure to check on you and address any questions or concerns that you may have regarding the information given to you following your procedure. If we do not reach you, we will leave a message.  However, if you are feeling well and you are not experiencing any problems, there is no need to return our call.  We will assume that you have returned to your regular daily activities without incident.  If any biopsies were taken you will be contacted by phone or by letter within the next 1-3 weeks.  Please call us at [phone] if you have not heard about the biopsies in 3 weeks.    SIGNATURES/CONFIDENTIALITY: You and/or your care partner have signed paperwork which will be entered into your electronic medical record.  These signatures attest to the fact that that the information above on your After Visit Summary has been reviewed and is understood.  Full responsibility of the confidentiality of this discharge information lies with you and/or your care-partner. 

## 2016-11-10 NOTE — Progress Notes (Signed)
Pt's states no medical or surgical changes since previsit or office visit. 

## 2016-11-11 ENCOUNTER — Telehealth: Payer: Self-pay | Admitting: *Deleted

## 2016-11-11 NOTE — Telephone Encounter (Signed)
No answer, left message to call if questions or concerns. 

## 2016-11-15 ENCOUNTER — Other Ambulatory Visit: Payer: Self-pay | Admitting: Family Medicine

## 2016-11-15 DIAGNOSIS — I10 Essential (primary) hypertension: Secondary | ICD-10-CM

## 2016-11-15 MED FILL — HYDROCHLOROTHIAZIDE 25 MG T: 25 | 90 days supply | Qty: 90 | Fill #0

## 2016-11-29 MED FILL — PROPRANOLOL 10 MG TABLET: 10 | 7 days supply | Qty: 30 | Fill #1

## 2016-11-29 MED FILL — FLUTICASONE PROP 50 MCG SPR: 50 | 30 days supply | Qty: 16 | Fill #2

## 2016-12-07 MED FILL — SIMVASTATIN 20 MG TABLET: 20 | 90 days supply | Qty: 90 | Fill #1

## 2016-12-07 MED FILL — SYMBICORT 80-4.5 MCG INH: 80-4.5 | 30 days supply | Qty: 10 | Fill #3

## 2016-12-30 DIAGNOSIS — E039 Hypothyroidism, unspecified: Secondary | ICD-10-CM | POA: Diagnosis not present

## 2016-12-30 DIAGNOSIS — E782 Mixed hyperlipidemia: Secondary | ICD-10-CM | POA: Diagnosis not present

## 2016-12-30 DIAGNOSIS — N951 Menopausal and female climacteric states: Secondary | ICD-10-CM | POA: Diagnosis not present

## 2017-01-10 MED FILL — FLUTICASONE PROP 50 MCG SPR: 50 | 30 days supply | Qty: 16 | Fill #3

## 2017-01-17 MED FILL — AMLODIPINE BESYLATE 5 MG TA: 5 | 30 days supply | Qty: 30 | Fill #1

## 2017-02-01 ENCOUNTER — Other Ambulatory Visit: Payer: Self-pay | Admitting: Family Medicine

## 2017-02-01 MED FILL — HYDROCHLOROTHIAZIDE 25 MG T: 25 | 30 days supply | Qty: 30 | Fill #1

## 2017-02-01 MED FILL — SYMBICORT 80-4.5 MCG INH: 80-4.5 | 30 days supply | Qty: 10 | Fill #4

## 2017-02-04 MED FILL — METFORMIN HCL ER 500 MG TAB: 500 | 90 days supply | Qty: 180 | Fill #0

## 2017-02-14 MED FILL — FLUTICASONE PROP 50 MCG SPR: 50 | 30 days supply | Qty: 16 | Fill #4

## 2017-02-21 MED FILL — PROPRANOLOL 10 MG TABLET: 10 | 7 days supply | Qty: 30 | Fill #2

## 2017-02-21 MED FILL — AMLODIPINE BESYLATE 5 MG TA: 5 | 30 days supply | Qty: 30 | Fill #2

## 2017-02-23 MED FILL — NATURE-THROID 32.5 MG TAB: 32.5 | 30 days supply | Qty: 45 | Fill #0

## 2017-03-02 ENCOUNTER — Other Ambulatory Visit (HOSPITAL_BASED_OUTPATIENT_CLINIC_OR_DEPARTMENT_OTHER): Payer: Self-pay | Admitting: Obstetrics and Gynecology

## 2017-03-02 DIAGNOSIS — Z1231 Encounter for screening mammogram for malignant neoplasm of breast: Secondary | ICD-10-CM

## 2017-03-11 DIAGNOSIS — E119 Type 2 diabetes mellitus without complications: Secondary | ICD-10-CM | POA: Diagnosis not present

## 2017-03-11 DIAGNOSIS — H52221 Regular astigmatism, right eye: Secondary | ICD-10-CM | POA: Diagnosis not present

## 2017-03-11 DIAGNOSIS — H5203 Hypermetropia, bilateral: Secondary | ICD-10-CM | POA: Diagnosis not present

## 2017-03-15 ENCOUNTER — Other Ambulatory Visit: Payer: Self-pay | Admitting: Family Medicine

## 2017-03-15 DIAGNOSIS — E785 Hyperlipidemia, unspecified: Secondary | ICD-10-CM

## 2017-03-15 DIAGNOSIS — I10 Essential (primary) hypertension: Secondary | ICD-10-CM

## 2017-03-15 MED FILL — SIMVASTATIN 20 MG TABLET: 20 | 90 days supply | Qty: 90 | Fill #0

## 2017-03-15 MED FILL — HYDROCHLOROTHIAZIDE 25 MG T: 25 | 90 days supply | Qty: 90 | Fill #0

## 2017-03-22 ENCOUNTER — Other Ambulatory Visit: Payer: Self-pay | Admitting: Family Medicine

## 2017-03-23 MED FILL — AMLODIPINE BESYLATE 5 MG TA: 5 | 90 days supply | Qty: 90 | Fill #0

## 2017-03-28 MED FILL — NATURE-THROID 32.5 MG TAB: 32.5 | 30 days supply | Qty: 45 | Fill #1

## 2017-03-29 MED FILL — SYMBICORT 80-4.5 MCG INH: 80-4.5 | 30 days supply | Qty: 10 | Fill #5

## 2017-03-29 MED FILL — FLUTICASONE PROP 50 MCG SPR: 50 | 30 days supply | Qty: 16 | Fill #5

## 2017-04-01 ENCOUNTER — Ambulatory Visit (HOSPITAL_BASED_OUTPATIENT_CLINIC_OR_DEPARTMENT_OTHER)
Admission: RE | Admit: 2017-04-01 | Discharge: 2017-04-01 | Disposition: A | Discharge status: Home / Self Care | Payer: 59 | Source: Ambulatory Visit | Attending: Obstetrics and Gynecology | Admitting: Obstetrics and Gynecology

## 2017-04-01 DIAGNOSIS — Z1231 Encounter for screening mammogram for malignant neoplasm of breast: Secondary | ICD-10-CM | POA: Insufficient documentation

## 2017-04-04 DIAGNOSIS — Z01419 Encounter for gynecological examination (general) (routine) without abnormal findings: Secondary | ICD-10-CM | POA: Diagnosis not present

## 2017-04-04 DIAGNOSIS — N8189 Other female genital prolapse: Secondary | ICD-10-CM | POA: Diagnosis not present

## 2017-04-04 DIAGNOSIS — Z6824 Body mass index (BMI) 24.0-24.9, adult: Secondary | ICD-10-CM | POA: Diagnosis not present

## 2017-04-04 DIAGNOSIS — N8110 Cystocele, unspecified: Secondary | ICD-10-CM | POA: Diagnosis not present

## 2017-04-04 DIAGNOSIS — N951 Menopausal and female climacteric states: Secondary | ICD-10-CM | POA: Diagnosis not present

## 2017-04-27 ENCOUNTER — Other Ambulatory Visit: Payer: Self-pay | Admitting: Family Medicine

## 2017-04-28 MED FILL — METFORMIN HCL ER 500 MG TAB: 500 | 30 days supply | Qty: 60 | Fill #0

## 2017-05-03 MED FILL — FLUTICASONE PROP 50 MCG SPR: 50 | 30 days supply | Qty: 16 | Fill #6

## 2017-05-03 MED FILL — NATURE-THROID 32.5 MG TAB: 32.5 | 30 days supply | Qty: 45 | Fill #2

## 2017-06-06 ENCOUNTER — Other Ambulatory Visit: Payer: Self-pay | Admitting: Family Medicine

## 2017-06-06 DIAGNOSIS — Z8709 Personal history of other diseases of the respiratory system: Secondary | ICD-10-CM

## 2017-06-06 DIAGNOSIS — J0140 Acute pansinusitis, unspecified: Secondary | ICD-10-CM

## 2017-06-06 MED FILL — HYDROCHLOROTHIAZIDE 25 MG T: 25 | 30 days supply | Qty: 30 | Fill #1

## 2017-06-06 MED FILL — NATURE-THROID 32.5 MG TAB: 32.5 | 30 days supply | Qty: 45 | Fill #3

## 2017-06-07 MED FILL — AMLODIPINE BESYLATE 5 MG TA: 5 | 90 days supply | Qty: 90 | Fill #0

## 2017-06-07 MED FILL — SYMBICORT 80-4.5 MCG INH: 80-4.5 | 30 days supply | Qty: 10 | Fill #0

## 2017-06-07 MED FILL — FLUTICASONE PROP 50 MCG SPR: 50 | 30 days supply | Qty: 16 | Fill #0

## 2017-06-07 MED FILL — METFORMIN HCL ER 500 MG TAB: 500 | 30 days supply | Qty: 60 | Fill #0

## 2017-06-08 MED FILL — SIMVASTATIN 20 MG TABLET: 20 | 90 days supply | Qty: 90 | Fill #1

## 2017-06-22 ENCOUNTER — Telehealth: Payer: Self-pay | Admitting: Family Medicine

## 2017-06-22 NOTE — Telephone Encounter (Signed)
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 Would this be possible? Please advise

## 2017-06-23 NOTE — Telephone Encounter (Signed)
Maybe a Tuesday evening but then we can not do labs that day

## 2017-06-23 NOTE — Telephone Encounter (Signed)
There are no cpes available within that time frame.  Do you want to offer an appt with someone else or fit her in somewhere?

## 2017-06-24 ENCOUNTER — Encounter: Payer: 59 | Admitting: Family Medicine

## 2017-06-24 NOTE — Telephone Encounter (Signed)
Scheduled appt for 07/12/17 @3 :15

## 2017-06-28 ENCOUNTER — Telehealth: Payer: Self-pay | Admitting: *Deleted

## 2017-06-28 NOTE — Telephone Encounter (Signed)
[place]. No Show fee [employer]

## 2017-06-28 NOTE — Telephone Encounter (Signed)
Checked patients chart there is not a reason stated as to why she was scheduled for 06/24/17. Chart states she needed CPE on 5/28 not 5/10  Patient does not want to be charged  Please advise

## 2017-06-28 NOTE — Telephone Encounter (Signed)
Copied from [place] [id number]. Topic: Inquiry >> Jun 28, 2017  9:46 AM Vernona Rieger wrote: Reason for CRM: Patient states she received a message in [place] that she missed an appointment on 5/14. She said she never made that appointment and wants to make sure she is not charged.

## 2017-06-28 NOTE — Telephone Encounter (Signed)
No charge. 

## 2017-07-12 ENCOUNTER — Ambulatory Visit (INDEPENDENT_AMBULATORY_CARE_PROVIDER_SITE_OTHER): Payer: 59 | Admitting: Family Medicine

## 2017-07-12 ENCOUNTER — Encounter: Payer: Self-pay | Admitting: Family Medicine

## 2017-07-12 VITALS — BP 130/60 | HR 97 | Temp 98.8°F | Resp 16 | Ht 64.0 in

## 2017-07-12 DIAGNOSIS — E039 Hypothyroidism, unspecified: Secondary | ICD-10-CM

## 2017-07-12 DIAGNOSIS — E119 Type 2 diabetes mellitus without complications: Secondary | ICD-10-CM

## 2017-07-12 DIAGNOSIS — E1151 Type 2 diabetes mellitus with diabetic peripheral angiopathy without gangrene: Secondary | ICD-10-CM

## 2017-07-12 DIAGNOSIS — Z0000 Encounter for general adult medical examination without abnormal findings: Secondary | ICD-10-CM | POA: Diagnosis not present

## 2017-07-12 DIAGNOSIS — E663 Overweight: Secondary | ICD-10-CM

## 2017-07-12 DIAGNOSIS — I10 Essential (primary) hypertension: Secondary | ICD-10-CM

## 2017-07-12 DIAGNOSIS — E1165 Type 2 diabetes mellitus with hyperglycemia: Secondary | ICD-10-CM | POA: Diagnosis not present

## 2017-07-12 DIAGNOSIS — E785 Hyperlipidemia, unspecified: Secondary | ICD-10-CM

## 2017-07-12 DIAGNOSIS — IMO0002 Reserved for concepts with insufficient information to code with codable children: Secondary | ICD-10-CM

## 2017-07-12 MED ORDER — METFORMIN HCL ER 500 MG PO TB24: ORAL_TABLET | ORAL | 1 refills | Status: DC

## 2017-07-12 MED ORDER — SIMVASTATIN 20 MG PO TABS: 20.0000 mg | ORAL_TABLET | Freq: Every evening | ORAL | 1 refills | Status: DC

## 2017-07-12 MED ORDER — HYDROCHLOROTHIAZIDE 25 MG PO TABS: 25.0000 mg | ORAL_TABLET | Freq: Every day | ORAL | 1 refills | Status: DC

## 2017-07-12 MED FILL — HYDROCHLOROTHIAZIDE 25 MG T: 25 | 90 days supply | Qty: 90 | Fill #0

## 2017-07-12 MED FILL — METFORMIN HCL ER 500 MG TAB: 500 | 90 days supply | Qty: 180 | Fill #0

## 2017-07-12 NOTE — Assessment & Plan Note (Signed)
Tolerating statin, encouraged heart healthy diet, avoid trans fats, minimize simple carbs and saturated fats. Increase exercise as tolerated 

## 2017-07-12 NOTE — Assessment & Plan Note (Signed)
hgba1c to be checked, minimize simple carbs. Increase exercise as tolerated. Continue current meds  

## 2017-07-12 NOTE — Progress Notes (Signed)
Subjective:     Michelle [name] is a 62 y.o. female and is here for a comprehensive physical exam. The patient reports no problems  Still refuses to weigh here in the office. .  Pt also does not want to put on a gown for cpe.     Social History   Socioeconomic History  . Marital status: Married    Spouse name: Not on file  . Number of children: Not on file  . Years of education: Not on file  . Highest education level: Not on file  Occupational History  . Occupation: Therapist, sports  Social Needs  . Financial resource strain: Not on file  . Food insecurity:    Worry: Not on file    Inability: Not on file  . Transportation needs:    Medical: Not on file    Non-medical: Not on file  Tobacco Use  . Smoking status: Never Smoker  . Smokeless tobacco: Never Used  Substance and Sexual Activity  . Alcohol use: No  . Drug use: No  . Sexual activity: Yes  Lifestyle  . Physical activity:    Days per week: Not on file    Minutes per session: Not on file  . Stress: Not on file  Relationships  . Social connections:    Talks on phone: Not on file    Gets together: Not on file    Attends religious service: Not on file    Active member of club or organization: Not on file    Attends meetings of clubs or organizations: Not on file    Relationship status: Not on file  . Intimate partner violence:    Fear of current or ex partner: Not on file    Emotionally abused: Not on file    Physically abused: Not on file    Forced sexual activity: Not on file  Other Topics Concern  . Not on file  Social History Narrative   Exercise- Walks about an hour a day   Health Maintenance  Topic Date Due  . URINE MICROALBUMIN  03/10/1963  . HIV Screening  03/09/1968  . HEMOGLOBIN A1C  02/20/2017  . PAP SMEAR  06/24/2017  . MAMMOGRAM  04/01/2018  . OPHTHALMOLOGY EXAM  04/06/2018  . FOOT EXAM  07/13/2018  . COLONOSCOPY  11/12/2026  . Hepatitis C Screening  Completed    The following portions of the  patient's history were reviewed and updated as appropriate:  She  has a past medical history of Allergy, Asthma, Bronchitis, COPD (chronic obstructive pulmonary disease) ([place]), Diabetes mellitus without complication ([place]), Hyperlipidemia, Hypertension, Kidney stones, Obesity, and Thyroid disease. She does not have any pertinent problems on file. She  has a past surgical history that includes Tonsillectomy and adenoidectomy (childhood); Tubal ligation; and Vaginal hysterectomy (2011). Her family history includes Colon polyps in her brother; Heart disease in her father and mother; Hyperlipidemia in her mother; Hypertension in her mother; Prostate cancer in her brother; Stroke in her mother. She  reports that she has never smoked. She has never used smokeless tobacco. She reports that she does not drink alcohol or use drugs. She has a current medication list which includes the following prescription(s): albuterol, alpha-lipoic acid, AMBULATORY NON FORMULARY MEDICATION, amlodipine, budesonide-formoterol, calcium-magnesium, vitamin d3, fluticasone, hydrochlorothiazide, metformin, methylcobalamin, multiple vitamin, nature-throid, NON FORMULARY, probiotic, propranolol, and simvastatin, and the following Facility-Administered Medications: sodium chloride. Current Outpatient Medications on File Prior to Visit  Medication Sig Dispense Refill  . albuterol (PROVENTIL HFA;VENTOLIN HFA)  108 (90 BASE) MCG/ACT inhaler INhale 1 puffs into lungs every 8 hours prn wheezing or SOB 1 Inhaler 0  . ALPHA LIPOIC ACID PO Take 1-2 capsules by mouth 2 (two) times daily.      . AMBULATORY NON FORMULARY MEDICATION Medication Name: Biest/Progesterone/Testosterone/DHEA 2.5/250/0.5/2mg     . amLODipine (NORVASC) 5 MG tablet TAKE 1 TABLET (5 MG TOTAL) BY MOUTH DAILY. 90 tablet 0  . budesonide-formoterol (SYMBICORT) 80-4.5 MCG/ACT inhaler Inhale 1 puff into the lungs 2 (two) times daily.    . Calcium-Magnesium (CAL-MAG PO) Take 1 tablet  by mouth daily.    . Cholecalciferol (VITAMIN D3) 1000 units CAPS Take 1 capsule by mouth daily.    . fluticasone (FLONASE) 50 MCG/ACT nasal spray Place 1 spray into both nostrils daily.    . Methylcobalamin 50000 MCG SOLR Inject 5,000 mcg as directed 2 (two) times a week.    . MULTIPLE VITAMIN PO Take 1 tablet by mouth daily.      Marland Kitchen NATURE-THROID 32.5 MG tablet Take 1 tablet by mouth daily.  3  . NON FORMULARY Take 1 tablet by mouth 2 (two) times daily. Heal-n-Soothe Enzymes    . PROBIOTIC CAPS Take 1 capsule by mouth daily.      . propranolol (INDERAL) 10 MG tablet Take 5 mg by mouth 3 (three) times daily.     Current Facility-Administered Medications on File Prior to Visit  Medication Dose Route Frequency Provider Last Rate Last Dose  . 0.9 %  sodium chloride infusion  500 mL Intravenous Continuous Nandigam, Kavitha V, MD       She is allergic to influenza vaccine live; shrimp [shellfish allergy]; tdap [tetanus-diphth-acell pertussis]; demerol [meperidine]; and latex..  Review of Systems Review of Systems  Constitutional: Negative for activity change, appetite change and fatigue.  HENT: Negative for hearing loss, congestion, tinnitus and ear discharge.  dentist q46m Eyes: Negative for visual disturbance (see optho q1y -- vision corrected to 20/20 with glasses).  Respiratory: Negative for cough, chest tightness and shortness of breath.   Cardiovascular: Negative for chest pain, palpitations and leg swelling.  Gastrointestinal: Negative for abdominal pain, diarrhea, constipation and abdominal distention.  Genitourinary: Negative for urgency, frequency, decreased urine volume and difficulty urinating.  Musculoskeletal: Negative for back pain, arthralgias and gait problem.  Skin: Negative for color change, pallor and rash.  Neurological: Negative for dizziness, light-headedness, numbness and headaches.  Hematological: Negative for adenopathy. Does not bruise/bleed easily.   Psychiatric/Behavioral: Negative for suicidal ideas, confusion, sleep disturbance, self-injury, dysphoric mood, decreased concentration and agitation.       Objective:    BP 130/60 (BP Location: Left Arm, Cuff Size: Normal)   Pulse 97   Temp 98.8 F (37.1 C) (Oral)   Resp 16   Ht 5\' 4"  (1.626 m)   SpO2 97%   BMI 25.23 kg/m  General appearance: alert, cooperative, appears stated age and no distress Head: Normocephalic, without obvious abnormality, atraumatic Eyes: negative findings: lids and lashes normal, conjunctivae and sclerae normal and pupils equal, round, reactive to light and accomodation Ears: normal TM's and external ear canals both ears Nose: Nares normal. Septum midline. Mucosa normal. No drainage or sinus tenderness. Throat: lips, mucosa, and tongue normal; teeth and gums normal Neck: no adenopathy, no carotid bruit, no JVD, supple, symmetrical, trachea midline and thyroid not enlarged, symmetric, no tenderness/mass/nodules Back: symmetric, no curvature. ROM normal. No CVA tenderness. Lungs: clear to auscultation bilaterally Breasts: gyn Heart: regular rate and rhythm, S1, S2 normal, no  murmur, click, rub or gallop Abdomen: soft, non-tender; bowel sounds normal; no masses,  no organomegaly Pelvic: deferred--gyn Extremities: extremities normal, atraumatic, no cyanosis or edema Pulses: 2+ and symmetric Skin: Skin color, texture, turgor normal. No rashes or lesions Lymph nodes: Cervical, supraclavicular, and axillary nodes normal. Neurologic: Alert and oriented X 3, normal strength and tone. Normal symmetric reflexes. Normal coordination and gait    Assessment:    Healthy female exam.      Plan     ghm utd Check labs See After Visit Summary for Counseling Recommendations    1. Hyperlipidemia LDL goal <100 Tolerating statin, encouraged heart healthy diet, avoid trans fats, minimize simple carbs and saturated fats. Increase exercise as tolerated - simvastatin  (ZOCOR) 20 MG tablet; Take 1 tablet (20 mg total) by mouth every evening.  Dispense: 90 tablet; Refill: 1 - hydrochlorothiazide (HYDRODIURIL) 25 MG tablet; Take 1 tablet (25 mg total) by mouth daily.  Dispense: 90 tablet; Refill: 1 - CBC with Differential/Platelet; Future - Comprehensive metabolic panel; Future - Lipid panel; Future - TSH; Future - Microalbumin / creatinine urine ratio; Future  2. Essential hypertension Well controlled, no changes to meds. Encouraged heart healthy diet such as the DASH diet and exercise as tolerated.  - simvastatin (ZOCOR) 20 MG tablet; Take 1 tablet (20 mg total) by mouth every evening.  Dispense: 90 tablet; Refill: 1 - hydrochlorothiazide (HYDRODIURIL) 25 MG tablet; Take 1 tablet (25 mg total) by mouth daily.  Dispense: 90 tablet; Refill: 1 - CBC with Differential/Platelet; Future - Comprehensive metabolic panel; Future - Lipid panel; Future - TSH; Future - Microalbumin / creatinine urine ratio; Future  3. DM (diabetes mellitus) type II uncontrolled, periph vascular disorder ([place]) Check labs  hgba1c to be checked , minimize simple carbs. Increase exercise as tolerated. Continue current meds  - metFORMIN (GLUCOPHAGE-XR) 500 MG 24 hr tablet; TAKE 1 TABLET (500 MG TOTAL) BY MOUTH 2 (TWO) TIMES DAILY.  Dispense: 180 tablet; Refill: 1 - Hemoglobin A1c; Future - CBC with Differential/Platelet; Future - Comprehensive metabolic panel; Future - Lipid panel; Future - TSH; Future - Microalbumin / creatinine urine ratio; Future  4. Preventative health care See above   5. Diabetes mellitus without complication ([place]) [id number] to be checked , minimize simple carbs. Increase exercise as tolerated. Continue current meds   6. Hyperlipidemia, unspecified hyperlipidemia type Tolerating statin, encouraged heart healthy diet, avoid trans fats, minimize simple carbs and saturated fats. Increase exercise as tolerated  7. Hypothyroidism, unspecified type Per  [facility]   8. Overweight (BMI 25.0-29.9) Doing great!  con't exercise and diet

## 2017-07-12 NOTE — Patient Instructions (Signed)
Preventive Care 40-64 Years, Female Preventive care refers to lifestyle choices and visits with your health care provider that can promote health and wellness. What does preventive care include?  A yearly physical exam. This is also called an annual well check.  Dental exams once or twice a year.  Routine eye exams. Ask your health care provider how often you should have your eyes checked.  Personal lifestyle choices, including: ? Daily care of your teeth and gums. ? Regular physical activity. ? Eating a healthy diet. ? Avoiding tobacco and drug use. ? Limiting alcohol use. ? Practicing safe sex. ? Taking low-dose aspirin daily starting at age 58. ? Taking vitamin and mineral supplements as recommended by your health care provider. What happens during an annual well check? The services and screenings done by your health care provider during your annual well check will depend on your age, overall health, lifestyle risk factors, and family history of disease. Counseling Your health care provider may ask you questions about your:  Alcohol use.  Tobacco use.  Drug use.  Emotional well-being.  Home and relationship well-being.  Sexual activity.  Eating habits.  Work and work Statistician.  Method of birth control.  Menstrual cycle.  Pregnancy history.  Screening You may have the following tests or measurements:  Height, weight, and BMI.  Blood pressure.  Lipid and cholesterol levels. These may be checked every 5 years, or more frequently if you are over 81 years old.  Skin check.  Lung cancer screening. You may have this screening every year starting at age 78 if you have a 30-pack-year history of smoking and currently smoke or have quit within the past 15 years.  Fecal occult blood test (FOBT) of the stool. You may have this test every year starting at age 65.  Flexible sigmoidoscopy or colonoscopy. You may have a sigmoidoscopy every 5 years or a colonoscopy  every 10 years starting at age 30.  Hepatitis C blood test.  Hepatitis B blood test.  Sexually transmitted disease (STD) testing.  Diabetes screening. This is done by checking your blood sugar (glucose) after you have not eaten for a while (fasting). You may have this done every 1-3 years.  Mammogram. This may be done every 1-2 years. Talk to your health care provider about when you should start having regular mammograms. This may depend on whether you have a family history of breast cancer.  BRCA-related cancer screening. This may be done if you have a family history of breast, ovarian, tubal, or peritoneal cancers.  Pelvic exam and Pap test. This may be done every 3 years starting at age 80. Starting at age 36, this may be done every 5 years if you have a Pap test in combination with an HPV test.  Bone density scan. This is done to screen for osteoporosis. You may have this scan if you are at high risk for osteoporosis.  Discuss your test results, treatment options, and if necessary, the need for more tests with your health care provider. Vaccines Your health care provider may recommend certain vaccines, such as:  Influenza vaccine. This is recommended every year.  Tetanus, diphtheria, and acellular pertussis (Tdap, Td) vaccine. You may need a Td booster every 10 years.  Varicella vaccine. You may need this if you have not been vaccinated.  Zoster vaccine. You may need this after age 5.  Measles, mumps, and rubella (MMR) vaccine. You may need at least one dose of MMR if you were born in  1959 or later. You may also need a second dose.  Pneumococcal 13-valent conjugate (PCV13) vaccine. You may need this if you have certain conditions and were not previously vaccinated.  Pneumococcal polysaccharide (PPSV23) vaccine. You may need one or two doses if you smoke cigarettes or if you have certain conditions.  Meningococcal vaccine. You may need this if you have certain  conditions.  Hepatitis A vaccine. You may need this if you have certain conditions or if you travel or work in places where you may be exposed to hepatitis A.  Hepatitis B vaccine. You may need this if you have certain conditions or if you travel or work in places where you may be exposed to hepatitis B.  Haemophilus influenzae type b (Hib) vaccine. You may need this if you have certain conditions.  Talk to your health care provider about which screenings and vaccines you need and how often you need them. This information is not intended to replace advice given to you by your health care provider. Make sure you discuss any questions you have with your health care provider. Document Released: 02/28/2015 Document Revised: 10/22/2015 Document Reviewed: 12/03/2014 Elsevier Interactive Patient Education  2018 Elsevier Inc.  

## 2017-07-12 NOTE — Assessment & Plan Note (Signed)
Well controlled, no changes to meds. Encouraged heart healthy diet such as the DASH diet and exercise as tolerated.  °

## 2017-07-12 NOTE — Assessment & Plan Note (Signed)
Per [facility]

## 2017-07-14 ENCOUNTER — Telehealth: Payer: Self-pay | Admitting: Emergency Medicine

## 2017-07-14 ENCOUNTER — Other Ambulatory Visit (INDEPENDENT_AMBULATORY_CARE_PROVIDER_SITE_OTHER): Payer: 59

## 2017-07-14 ENCOUNTER — Other Ambulatory Visit: Payer: Self-pay | Admitting: *Deleted

## 2017-07-14 DIAGNOSIS — Z0000 Encounter for general adult medical examination without abnormal findings: Secondary | ICD-10-CM

## 2017-07-14 DIAGNOSIS — E785 Hyperlipidemia, unspecified: Secondary | ICD-10-CM | POA: Diagnosis not present

## 2017-07-14 DIAGNOSIS — E559 Vitamin D deficiency, unspecified: Secondary | ICD-10-CM

## 2017-07-14 DIAGNOSIS — E1151 Type 2 diabetes mellitus with diabetic peripheral angiopathy without gangrene: Secondary | ICD-10-CM | POA: Diagnosis not present

## 2017-07-14 DIAGNOSIS — IMO0002 Reserved for concepts with insufficient information to code with codable children: Secondary | ICD-10-CM

## 2017-07-14 DIAGNOSIS — I10 Essential (primary) hypertension: Secondary | ICD-10-CM | POA: Diagnosis not present

## 2017-07-14 DIAGNOSIS — E1165 Type 2 diabetes mellitus with hyperglycemia: Secondary | ICD-10-CM | POA: Diagnosis not present

## 2017-07-14 DIAGNOSIS — E039 Hypothyroidism, unspecified: Secondary | ICD-10-CM | POA: Diagnosis not present

## 2017-07-14 LAB — LIPID PANEL
CHOL/HDL RATIO: 3
Cholesterol: 178 mg/dL (ref 0–200)
HDL: 67.6 mg/dL (ref >39.00)
LDL Cholesterol: 101 mg/dL — ABNORMAL HIGH (ref 0–99)
NONHDL: 110.87
Triglycerides: 50 mg/dL (ref 0.0–149.0)
VLDL: 10 mg/dL (ref 0.0–40.0)

## 2017-07-14 LAB — COMPREHENSIVE METABOLIC PANEL
ALBUMIN: 4.3 g/dL (ref 3.5–5.2)
ALK PHOS: 29 U/L — AB (ref 39–117)
ALT: 18 U/L (ref 0–35)
AST: 18 U/L (ref 0–37)
BUN: 14 mg/dL (ref 6–23)
CO2: 30 mEq/L (ref 19–32)
CREATININE: 0.8 mg/dL (ref 0.40–1.20)
Calcium: 9.5 mg/dL (ref 8.4–10.5)
Chloride: 99 mEq/L (ref 96–112)
GFR: 76.67 mL/min (ref >60.00)
GLUCOSE: 113 mg/dL — AB (ref 70–99)
POTASSIUM: 4.1 meq/L (ref 3.5–5.1)
SODIUM: 135 meq/L (ref 135–145)
Total Bilirubin: 0.7 mg/dL (ref 0.2–1.2)
Total Protein: 7.5 g/dL (ref 6.0–8.3)

## 2017-07-14 LAB — CBC WITH DIFFERENTIAL/PLATELET
BASOS ABS: 0.1 10*3/uL (ref 0.0–0.1)
Basophils Relative: 1.1 % (ref 0.0–3.0)
Eosinophils Absolute: 0.8 10*3/uL — ABNORMAL HIGH (ref 0.0–0.7)
Eosinophils Relative: 10.3 % — ABNORMAL HIGH (ref 0.0–5.0)
HCT: 42.4 % (ref 36.0–46.0)
HEMOGLOBIN: 14.1 g/dL (ref 12.0–15.0)
Lymphocytes Relative: 47.5 % — ABNORMAL HIGH (ref 12.0–46.0)
Lymphs Abs: 3.5 10*3/uL (ref 0.7–4.0)
MCHC: 33.2 g/dL (ref 30.0–36.0)
MCV: 86.8 fl (ref 78.0–100.0)
MONO ABS: 0.7 10*3/uL (ref 0.1–1.0)
Monocytes Relative: 9.4 % (ref 3.0–12.0)
NEUTROS PCT: 31.7 % — AB (ref 43.0–77.0)
Neutro Abs: 2.4 10*3/uL (ref 1.4–7.7)
Platelets: 275 10*3/uL (ref 150.0–400.0)
RBC: 4.89 Mil/uL (ref 3.87–5.11)
RDW: 13.7 % (ref 11.5–15.5)
WBC: 7.5 10*3/uL (ref 4.0–10.5)

## 2017-07-14 LAB — MICROALBUMIN / CREATININE URINE RATIO
CREATININE, U: 14.4 mg/dL
MICROALB/CREAT RATIO: 4.9 mg/g (ref 0.0–30.0)

## 2017-07-14 LAB — HEMOGLOBIN A1C: Hgb A1c MFr Bld: 5.5 % (ref 4.6–6.5)

## 2017-07-14 LAB — VITAMIN D 25 HYDROXY (VIT D DEFICIENCY, FRACTURES): VITD: 129.96 ng/mL — AB (ref 30.00–100.00)

## 2017-07-14 LAB — TSH: TSH: 1.56 u[IU]/mL (ref 0.35–4.50)

## 2017-07-14 MED ORDER — SIMVASTATIN 40 MG PO TABS: 40.0000 mg | ORAL_TABLET | Freq: Every day | ORAL | 2 refills | Status: DC

## 2017-07-14 NOTE — Telephone Encounter (Signed)
"  CRITICAL VALUE STICKER  CRITICAL VALUE:Vitamin D  RECEIVER (on-site recipient of call):Michelle Mayhall P.  DATE & TIME NOTIFIED: 07-14-17 1520  MESSENGER (representative from lab):Ci  MD NOTIFIED: [place]  TIME OF NOTIFICATION:1530  RESPONSE:

## 2017-07-14 NOTE — Telephone Encounter (Signed)
See labs 

## 2017-07-14 NOTE — Addendum Note (Signed)
Addended by: Kem Boroughs D on: 07/14/2017 04:49 PM   Modules accepted: Orders

## 2017-07-15 NOTE — Telephone Encounter (Signed)
Patient notified of labs.   

## 2017-07-24 ENCOUNTER — Encounter: Payer: Self-pay | Admitting: Family Medicine

## 2017-07-25 NOTE — Telephone Encounter (Signed)
Ok  To wait 3 months

## 2017-07-28 MED FILL — SYMBICORT 80-4.5 MCG INH: 80-4.5 | 30 days supply | Qty: 10 | Fill #1

## 2017-07-28 MED FILL — FLUTICASONE PROP 50 MCG SPR: 50 | 30 days supply | Qty: 16 | Fill #1

## 2017-09-12 ENCOUNTER — Other Ambulatory Visit: Payer: Self-pay | Admitting: Family Medicine

## 2017-09-12 MED FILL — SIMVASTATIN 20 MG TABLET: 20 | 90 days supply | Qty: 90 | Fill #0

## 2017-09-12 MED FILL — FLUTICASONE PROP 50 MCG SPR: 50 | 30 days supply | Qty: 16 | Fill #2

## 2017-09-12 MED FILL — AMLODIPINE BESYLATE 5 MG TA: 5 | 90 days supply | Qty: 90 | Fill #0

## 2017-09-23 ENCOUNTER — Telehealth: Payer: Self-pay | Admitting: Family Medicine

## 2017-09-23 NOTE — Telephone Encounter (Signed)
Please advise 

## 2017-09-23 NOTE — Telephone Encounter (Signed)
Copied from [place] [id number]. Topic: Quick Communication - See Telephone Encounter >> Sep 23, 2017  3:55 PM Burchel, Abbi R wrote: CRM for notification. See Telephone encounter for: 09/23/17.  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  She is requesting a diflucan or something similar to treat it.  Please advise.  Pt: [phone]

## 2017-09-25 ENCOUNTER — Other Ambulatory Visit: Payer: Self-pay | Admitting: Family Medicine

## 2017-09-25 NOTE — Telephone Encounter (Signed)
She can have Diflucan 150 mg tabs, 1 tab po q week x 2 weeks. Disp #2 and make sue she does not take her Simvastatin the day she takes the Diflucan and the day after

## 2017-09-26 MED ORDER — FLUCONAZOLE 150 MG PO TABS: ORAL_TABLET | ORAL | 0 refills | Status: DC

## 2017-09-26 MED FILL — FLUCONAZOLE 150 MG TABS: 150 | 14 days supply | Qty: 2 | Fill #0

## 2017-09-26 MED FILL — HYDROCHLOROTHIAZIDE 25 MG T: 25 | 90 days supply | Qty: 90 | Fill #1

## 2017-09-26 NOTE — Telephone Encounter (Signed)
Patient notified and rx sent in 

## 2017-09-29 MED FILL — SYMBICORT 80-4.5 MCG INH: 80-4.5 | 30 days supply | Qty: 10 | Fill #2

## 2017-09-29 MED FILL — METFORMIN HCL ER 500 MG TAB: 500 | 90 days supply | Qty: 180 | Fill #1

## 2017-09-30 MED FILL — FLUCONAZOLE 150 MG TABS: 150 | 6 days supply | Qty: 3 | Fill #0

## 2017-10-12 DIAGNOSIS — R5383 Other fatigue: Secondary | ICD-10-CM | POA: Diagnosis not present

## 2017-10-12 DIAGNOSIS — N951 Menopausal and female climacteric states: Secondary | ICD-10-CM | POA: Diagnosis not present

## 2017-10-12 DIAGNOSIS — J439 Emphysema, unspecified: Secondary | ICD-10-CM | POA: Diagnosis not present

## 2017-10-12 DIAGNOSIS — E039 Hypothyroidism, unspecified: Secondary | ICD-10-CM | POA: Diagnosis not present

## 2017-10-20 ENCOUNTER — Other Ambulatory Visit: Payer: Self-pay | Admitting: Family Medicine

## 2017-10-20 ENCOUNTER — Other Ambulatory Visit (INDEPENDENT_AMBULATORY_CARE_PROVIDER_SITE_OTHER): Payer: 59

## 2017-10-20 ENCOUNTER — Telehealth: Payer: Self-pay | Admitting: Emergency Medicine

## 2017-10-20 DIAGNOSIS — I10 Essential (primary) hypertension: Secondary | ICD-10-CM

## 2017-10-20 DIAGNOSIS — E559 Vitamin D deficiency, unspecified: Secondary | ICD-10-CM | POA: Diagnosis not present

## 2017-10-20 DIAGNOSIS — E785 Hyperlipidemia, unspecified: Secondary | ICD-10-CM

## 2017-10-20 LAB — COMPREHENSIVE METABOLIC PANEL
ALT: 14 U/L (ref 0–35)
AST: 13 U/L (ref 0–37)
Albumin: 4 g/dL (ref 3.5–5.2)
Alkaline Phosphatase: 28 U/L — ABNORMAL LOW (ref 39–117)
BUN: 15 mg/dL (ref 6–23)
CALCIUM: 9.2 mg/dL (ref 8.4–10.5)
CO2: 31 meq/L (ref 19–32)
Chloride: 101 mEq/L (ref 96–112)
Creatinine, Ser: 0.66 mg/dL (ref 0.40–1.20)
GFR: 95.65 mL/min (ref >60.00)
Glucose, Bld: 112 mg/dL — ABNORMAL HIGH (ref 70–99)
Potassium: 4.1 mEq/L (ref 3.5–5.1)
Sodium: 138 mEq/L (ref 135–145)
Total Bilirubin: 0.8 mg/dL (ref 0.2–1.2)
Total Protein: 6.3 g/dL (ref 6.0–8.3)

## 2017-10-20 LAB — VITAMIN D 25 HYDROXY (VIT D DEFICIENCY, FRACTURES): VITD: 119.4 ng/mL — AB (ref 30.00–100.00)

## 2017-10-20 LAB — LIPID PANEL
CHOLESTEROL: 148 mg/dL (ref 0–200)
HDL: 53.1 mg/dL (ref >39.00)
LDL Cholesterol: 83 mg/dL (ref 0–99)
NonHDL: 95.26
TRIGLYCERIDES: 60 mg/dL (ref 0.0–149.0)
Total CHOL/HDL Ratio: 3
VLDL: 12 mg/dL (ref 0.0–40.0)

## 2017-10-20 NOTE — Telephone Encounter (Signed)
See labs 

## 2017-10-20 NOTE — Telephone Encounter (Signed)
"  CRITICAL VALUE STICKER  CRITICAL VALUE:Vit d  RECEIVER (on-site recipient of call):Kristy p.  DATE & TIME NOTIFIED: 10/20/17 1140  MESSENGER (representative from lab):Kenney Houseman   MD NOTIFIED: Etter Sjogren  TIME OF NOTIFICATION:1145  RESPONSE:

## 2017-10-28 ENCOUNTER — Encounter: Payer: Self-pay | Admitting: *Deleted

## 2017-10-28 MED FILL — valACYclovir HCL 1 GM TABS: 1 | 30 days supply | Qty: 60 | Fill #0

## 2017-10-28 NOTE — Progress Notes (Signed)
mychart message sent to patient to call or mychart [place] back to see if she did or did not stop vitamin d.

## 2017-10-31 ENCOUNTER — Other Ambulatory Visit: Payer: Self-pay | Admitting: *Deleted

## 2017-10-31 DIAGNOSIS — E559 Vitamin D deficiency, unspecified: Secondary | ICD-10-CM

## 2017-11-10 ENCOUNTER — Other Ambulatory Visit: Payer: Self-pay | Admitting: Family Medicine

## 2017-11-10 DIAGNOSIS — I10 Essential (primary) hypertension: Secondary | ICD-10-CM

## 2017-11-10 MED FILL — FLUTICASONE PROP 50 MCG SPR: 50 | 30 days supply | Qty: 16 | Fill #3

## 2017-11-10 MED FILL — PROPRANOLOL 10 MG TABLET: 10 | 30 days supply | Qty: 90 | Fill #0

## 2017-11-11 ENCOUNTER — Other Ambulatory Visit (INDEPENDENT_AMBULATORY_CARE_PROVIDER_SITE_OTHER): Payer: 59

## 2017-11-11 DIAGNOSIS — E559 Vitamin D deficiency, unspecified: Secondary | ICD-10-CM | POA: Diagnosis not present

## 2017-11-14 LAB — PARATHYROID HORMONE, INTACT (NO CA): PTH: 26 pg/mL (ref 14–64)

## 2017-11-23 MED FILL — valACYclovir HCL 1 GM TABS: 1 | 30 days supply | Qty: 60 | Fill #1

## 2017-11-23 MED FILL — SYMBICORT 80-4.5 MCG INH: 80-4.5 | 30 days supply | Qty: 10 | Fill #3

## 2017-12-14 ENCOUNTER — Other Ambulatory Visit: Payer: Self-pay | Admitting: Family Medicine

## 2017-12-14 DIAGNOSIS — I10 Essential (primary) hypertension: Secondary | ICD-10-CM

## 2017-12-14 DIAGNOSIS — E785 Hyperlipidemia, unspecified: Secondary | ICD-10-CM

## 2017-12-14 MED FILL — SIMVASTATIN 20 MG TABLET: 20 | 90 days supply | Qty: 90 | Fill #1

## 2017-12-20 MED FILL — FLUCONAZOLE 150 MG TABS: 150 | 9 days supply | Qty: 3 | Fill #1

## 2017-12-20 MED FILL — HYDROCHLOROTHIAZIDE 25 MG T: 25 | 90 days supply | Qty: 90 | Fill #0

## 2017-12-20 MED FILL — AMLODIPINE BESYLATE 5 MG TA: 5 | 90 days supply | Qty: 90 | Fill #0

## 2017-12-30 ENCOUNTER — Other Ambulatory Visit: Payer: Self-pay | Admitting: Family Medicine

## 2017-12-30 DIAGNOSIS — E1165 Type 2 diabetes mellitus with hyperglycemia: Principal | ICD-10-CM

## 2017-12-30 DIAGNOSIS — E1151 Type 2 diabetes mellitus with diabetic peripheral angiopathy without gangrene: Secondary | ICD-10-CM

## 2017-12-30 DIAGNOSIS — IMO0002 Reserved for concepts with insufficient information to code with codable children: Secondary | ICD-10-CM

## 2017-12-30 MED FILL — metFORMIN HCL ER 500 MG TB2: 500 | 90 days supply | Qty: 180 | Fill #0

## 2018-01-16 MED FILL — FLUTICASONE PROP 50 MCG SPR: 50 | 30 days supply | Qty: 16 | Fill #4

## 2018-01-17 ENCOUNTER — Encounter: Payer: Self-pay | Admitting: Family Medicine

## 2018-01-17 ENCOUNTER — Ambulatory Visit (INDEPENDENT_AMBULATORY_CARE_PROVIDER_SITE_OTHER): Payer: 59 | Admitting: Family Medicine

## 2018-01-17 VITALS — BP 118/60 | HR 82 | Temp 98.5°F | Resp 16 | Ht 64.0 in

## 2018-01-17 DIAGNOSIS — E785 Hyperlipidemia, unspecified: Secondary | ICD-10-CM

## 2018-01-17 DIAGNOSIS — IMO0002 Reserved for concepts with insufficient information to code with codable children: Secondary | ICD-10-CM

## 2018-01-17 DIAGNOSIS — E1165 Type 2 diabetes mellitus with hyperglycemia: Secondary | ICD-10-CM | POA: Diagnosis not present

## 2018-01-17 DIAGNOSIS — E119 Type 2 diabetes mellitus without complications: Secondary | ICD-10-CM

## 2018-01-17 DIAGNOSIS — M8580 Other specified disorders of bone density and structure, unspecified site: Secondary | ICD-10-CM | POA: Diagnosis not present

## 2018-01-17 DIAGNOSIS — E559 Vitamin D deficiency, unspecified: Secondary | ICD-10-CM | POA: Diagnosis not present

## 2018-01-17 DIAGNOSIS — B370 Candidal stomatitis: Secondary | ICD-10-CM | POA: Diagnosis not present

## 2018-01-17 DIAGNOSIS — E1151 Type 2 diabetes mellitus with diabetic peripheral angiopathy without gangrene: Secondary | ICD-10-CM

## 2018-01-17 DIAGNOSIS — E039 Hypothyroidism, unspecified: Secondary | ICD-10-CM | POA: Diagnosis not present

## 2018-01-17 DIAGNOSIS — I10 Essential (primary) hypertension: Secondary | ICD-10-CM | POA: Diagnosis not present

## 2018-01-17 LAB — COMPREHENSIVE METABOLIC PANEL
ALBUMIN: 4.3 g/dL (ref 3.5–5.2)
ALT: 18 U/L (ref 0–35)
AST: 16 U/L (ref 0–37)
Alkaline Phosphatase: 29 U/L — ABNORMAL LOW (ref 39–117)
BUN: 22 mg/dL (ref 6–23)
CALCIUM: 9.6 mg/dL (ref 8.4–10.5)
CHLORIDE: 100 meq/L (ref 96–112)
CO2: 31 meq/L (ref 19–32)
Creatinine, Ser: 0.74 mg/dL (ref 0.40–1.20)
GFR: 83.75 mL/min (ref >60.00)
Glucose, Bld: 98 mg/dL (ref 70–99)
Potassium: 4 mEq/L (ref 3.5–5.1)
Sodium: 138 mEq/L (ref 135–145)
Total Bilirubin: 1.2 mg/dL (ref 0.2–1.2)
Total Protein: 6.7 g/dL (ref 6.0–8.3)

## 2018-01-17 LAB — LIPID PANEL
CHOLESTEROL: 172 mg/dL (ref 0–200)
HDL: 62.6 mg/dL (ref >39.00)
LDL CALC: 95 mg/dL (ref 0–99)
NonHDL: 109.8
TRIGLYCERIDES: 74 mg/dL (ref 0.0–149.0)
Total CHOL/HDL Ratio: 3
VLDL: 14.8 mg/dL (ref 0.0–40.0)

## 2018-01-17 LAB — VITAMIN D 25 HYDROXY (VIT D DEFICIENCY, FRACTURES): VITD: 93.25 ng/mL (ref 30.00–100.00)

## 2018-01-17 LAB — TSH: TSH: 1.14 u[IU]/mL (ref 0.35–4.50)

## 2018-01-17 LAB — HEMOGLOBIN A1C: HEMOGLOBIN A1C: 5.6 % (ref 4.6–6.5)

## 2018-01-17 MED ORDER — NYSTATIN 100000 UNIT/ML MT SUSP: 5.0000 mL | Freq: Four times a day (QID) | OROMUCOSAL | 1 refills | Status: DC

## 2018-01-17 MED FILL — NYSTATIN 100,000 UNITS/ML S: 100000 | 3 days supply | Qty: 60 | Fill #0

## 2018-01-17 NOTE — Patient Instructions (Signed)

## 2018-01-17 NOTE — Assessment & Plan Note (Signed)
Encouraged heart healthy diet, increase exercise, avoid trans fats, consider a krill oil cap daily 

## 2018-01-17 NOTE — Assessment & Plan Note (Signed)
Well controlled, no changes to meds. Encouraged heart healthy diet such as the DASH diet and exercise as tolerated.  °

## 2018-01-17 NOTE — Progress Notes (Signed)
Patient ID: Michelle [name], female    DOB: [birth year]  Age: 62 y.o. MRN: [id number]    Subjective:  Subjective  HPI Michelle [name] presents for f/u dm , chol and bp.  Review of Systems  Constitutional: Negative for appetite change, chills, diaphoresis, fatigue, fever and unexpected weight change.  HENT: Negative for congestion and hearing loss.   Eyes: Negative for pain, discharge, redness and visual disturbance.  Respiratory: Negative for cough, chest tightness, shortness of breath and wheezing.   Cardiovascular: Negative for chest pain, palpitations and leg swelling.  Gastrointestinal: Negative for abdominal pain, blood in stool, constipation, diarrhea, nausea and vomiting.  Endocrine: Negative for cold intolerance, heat intolerance, polydipsia, polyphagia and polyuria.  Genitourinary: Negative for difficulty urinating, dysuria, frequency, hematuria and urgency.  Musculoskeletal: Negative for back pain and myalgias.  Skin: Negative for rash.  Allergic/Immunologic: Negative for environmental allergies.  Neurological: Negative for dizziness, weakness, light-headedness, numbness and headaches.  Hematological: Does not bruise/bleed easily.  Psychiatric/Behavioral: Negative for suicidal ideas. The patient is not nervous/anxious.     History Past Medical History:  Diagnosis Date  . Allergy   . Asthma   . Bronchitis   . COPD (chronic obstructive pulmonary disease) (HCC)    mild  . Diabetes mellitus without complication ([place])   . Hyperlipidemia   . Hypertension    stress induced/ work makes it worse.  . Kidney stones   . Obesity   . Thyroid disease     She has a past surgical history that includes Tonsillectomy and adenoidectomy (childhood); Tubal ligation; and Vaginal hysterectomy (2011).   Her family history includes Colon polyps in her brother; Heart disease in her father and mother; Hyperlipidemia in her mother; Hypertension in her mother; Prostate cancer in her  brother; Stroke in her mother.She reports that she has never smoked. She has never used smokeless tobacco. She reports that she does not drink alcohol or use drugs.  Current Outpatient Medications on File Prior to Visit  Medication Sig Dispense Refill  . albuterol (PROVENTIL HFA;VENTOLIN HFA) 108 (90 BASE) MCG/ACT inhaler INhale 1 puffs into lungs every 8 hours prn wheezing or SOB 1 Inhaler 0  . ALPHA LIPOIC ACID PO Take 1-2 capsules by mouth 2 (two) times daily.      . AMBULATORY NON FORMULARY MEDICATION Medication Name: Biest/Progesterone/Testosterone/DHEA 2.5/250/0.5/2mg     . amLODipine (NORVASC) 5 MG tablet Take 1 tablet (5 mg total) by mouth daily. 90 tablet 0  . budesonide-formoterol (SYMBICORT) 80-4.5 MCG/ACT inhaler Inhale 1 puff into the lungs 2 (two) times daily.    . Calcium-Magnesium (CAL-MAG PO) Take 1 tablet by mouth daily.    . Cholecalciferol (VITAMIN D3 PO) Take 5,000 Units by mouth daily.    . fluticasone (FLONASE) 50 MCG/ACT nasal spray Place 1 spray into both nostrils daily.    . hydrochlorothiazide (HYDRODIURIL) 25 MG tablet Take 1 tablet (25 mg total) by mouth daily. 90 tablet 1  . metFORMIN (GLUCOPHAGE-XR) 500 MG 24 hr tablet TAKE 1 TABLET (500 MG TOTAL) BY MOUTH 2 (TWO) TIMES DAILY. 180 tablet 1  . Methylcobalamin 50000 MCG SOLR Inject 5,000 mcg as directed 2 (two) times a week.    . MULTIPLE VITAMIN PO Take 1 tablet by mouth daily.      Marland Kitchen NATURE-THROID 32.5 MG tablet Take 1 tablet by mouth daily.  3  . NON FORMULARY Take 1 tablet by mouth 2 (two) times daily. Heal-n-Soothe Enzymes    . PROBIOTIC CAPS Take 1 capsule by  mouth daily.      . propranolol (INDERAL) 10 MG tablet Take 5 mg by mouth 3 (three) times daily.    . simvastatin (ZOCOR) 20 MG tablet Take 1 tablet by mouth at bedtime.  1   Current Facility-Administered Medications on File Prior to Visit  Medication Dose Route Frequency Provider Last Rate Last Dose  . 0.9 %  sodium chloride infusion  500 mL Intravenous  Continuous Nandigam, Venia Minks, MD         Objective:  Objective  Physical Exam  Constitutional: She is oriented to person, place, and time. She appears well-developed and well-nourished.  HENT:  Head: Normocephalic and atraumatic.  Eyes: Conjunctivae and EOM are normal.  Neck: Normal range of motion. Neck supple. No JVD present. Carotid bruit is not present. No thyromegaly present.  Cardiovascular: Normal rate, regular rhythm and normal heart sounds.  No murmur heard. Pulmonary/Chest: Effort normal and breath sounds normal. No respiratory distress. She has no wheezes. She has no rales. She exhibits no tenderness.  Musculoskeletal: She exhibits no edema.  Neurological: She is alert and oriented to person, place, and time.  Psychiatric: She has a normal mood and affect.  Nursing note and vitals reviewed.  BP 118/60 (BP Location: Right Arm, Cuff Size: Normal)   Pulse 82   Temp 98.5 F (36.9 C) (Oral)   Resp 16   Ht 5\' 4"  (1.626 m)   SpO2 100%   BMI 25.23 kg/m  Wt Readings from Last 3 Encounters:  11/10/16 147 lb (66.7 kg)  10/27/16 147 lb 9.6 oz (67 kg)  10/19/16 143 lb 3.2 oz (65 kg)     Lab Results  Component Value Date   WBC 7.5 07/14/2017   HGB 14.1 07/14/2017   HCT 42.4 07/14/2017   PLT 275.0 07/14/2017   GLUCOSE 112 (H) 10/20/2017   CHOL 148 10/20/2017   TRIG 60.0 10/20/2017   HDL 53.10 10/20/2017   LDLCALC 83 10/20/2017   ALT 14 10/20/2017   AST 13 10/20/2017   NA 138 10/20/2017   K 4.1 10/20/2017   CL 101 10/20/2017   CREATININE 0.66 10/20/2017   BUN 15 10/20/2017   CO2 31 10/20/2017   TSH 1.56 07/14/2017   HGBA1C 5.5 07/14/2017   MICROALBUR <0.7 07/14/2017    Mm Screening Breast Tomo Bilateral  Result Date: 04/07/2017 CLINICAL DATA:  Screening. EXAM: DIGITAL SCREENING BILATERAL MAMMOGRAM WITH TOMO AND CAD COMPARISON:  Previous exam(s). ACR Breast Density Category b: There are scattered areas of fibroglandular density. FINDINGS: There are no findings  suspicious for malignancy. Images were processed with CAD. IMPRESSION: No mammographic evidence of malignancy. A result letter of this screening mammogram will be mailed directly to the patient. RECOMMENDATION: Screening mammogram in one year. (Code:SM-B-01Y) BI-RADS CATEGORY  1: Negative. Electronically Signed   By: Lajean Manes M.D.   On: 04/07/2017 09:15     Assessment & Plan:  Plan  I have discontinued Kayse L. Comes's Vitamin D3 and fluconazole. I am also having her start on nystatin. Additionally, I am having her maintain her MULTIPLE VITAMIN PO, Probiotic, ALPHA LIPOIC ACID PO, AMBULATORY NON FORMULARY MEDICATION, Calcium-Magnesium (CAL-MAG PO), albuterol, NATURE-THROID, budesonide-formoterol, NON FORMULARY, propranolol, Methylcobalamin, fluticasone, hydrochlorothiazide, amLODipine, metFORMIN, simvastatin, and Cholecalciferol (VITAMIN D3 PO). We will continue to administer sodium chloride.  Meds ordered this encounter  Medications  . nystatin (MYCOSTATIN) 100000 UNIT/ML suspension    Sig: Take 5 mLs (500,000 Units total) by mouth 4 (four) times daily. Swish and spit  Dispense:  60 mL    Refill:  1    Problem List Items Addressed This Visit      Unprioritized   Diabetes mellitus without complication ([place])    TDDU2G to be checked , minimize simple carbs. Increase exercise as tolerated. Continue current meds       Relevant Medications   simvastatin (ZOCOR) 20 MG tablet   HTN (hypertension)    Well controlled, no changes to meds. Encouraged heart healthy diet such as the DASH diet and exercise as tolerated.       Relevant Medications   simvastatin (ZOCOR) 20 MG tablet   Other Relevant Orders   Comprehensive metabolic panel   Hemoglobin A1c   Lipid panel   TSH   Hyperlipidemia - Primary    Encouraged heart healthy diet, increase exercise, avoid trans fats, consider a krill oil cap daily      Relevant Medications   simvastatin (ZOCOR) 20 MG tablet   Other Relevant  Orders   Comprehensive metabolic panel   Hemoglobin A1c   Lipid panel   TSH   Hypothyroidism    Check labs       Osteopenia   Relevant Orders   DG Bone Density    Other Visit Diagnoses    DM (diabetes mellitus) type II uncontrolled, periph vascular disorder (HCC)       Relevant Medications   simvastatin (ZOCOR) 20 MG tablet   Other Relevant Orders   Comprehensive metabolic panel   Hemoglobin A1c   Vitamin D deficiency       Relevant Orders   Vitamin D (25 hydroxy)   Thrush       Relevant Medications   nystatin (MYCOSTATIN) 100000 UNIT/ML suspension      Follow-up: Return in about 6 months (around 07/19/2018), or if symptoms worsen or fail to improve, for hypertension, hyperlipidemia, diabetes II, annual exam, fasting.  [name], DO

## 2018-01-17 NOTE — Assessment & Plan Note (Signed)
Check labs 

## 2018-01-17 NOTE — Assessment & Plan Note (Signed)
hgba1c to be checked, minimize simple carbs. Increase exercise as tolerated. Continue current meds  

## 2018-01-30 MED FILL — SYMBICORT 80-4.5 MCG INH: 80-4.5 | 30 days supply | Qty: 10 | Fill #4

## 2018-02-03 ENCOUNTER — Encounter: Payer: Self-pay | Admitting: Family Medicine

## 2018-02-03 DIAGNOSIS — H52223 Regular astigmatism, bilateral: Secondary | ICD-10-CM | POA: Diagnosis not present

## 2018-02-03 DIAGNOSIS — H524 Presbyopia: Secondary | ICD-10-CM | POA: Diagnosis not present

## 2018-02-03 DIAGNOSIS — H25813 Combined forms of age-related cataract, bilateral: Secondary | ICD-10-CM | POA: Diagnosis not present

## 2018-02-03 DIAGNOSIS — E119 Type 2 diabetes mellitus without complications: Secondary | ICD-10-CM | POA: Diagnosis not present

## 2018-02-03 DIAGNOSIS — H5203 Hypermetropia, bilateral: Secondary | ICD-10-CM | POA: Diagnosis not present

## 2018-02-03 LAB — HM DIABETES EYE EXAM

## 2018-02-16 ENCOUNTER — Telehealth: Payer: Self-pay | Admitting: *Deleted

## 2018-02-16 NOTE — Telephone Encounter (Signed)
Received Diabetic Eye Exam Report from [place] [facility]; forwarded to provider/SLS 01/02

## 2018-02-17 ENCOUNTER — Other Ambulatory Visit (HOSPITAL_BASED_OUTPATIENT_CLINIC_OR_DEPARTMENT_OTHER): Payer: Self-pay | Admitting: Family Medicine

## 2018-02-17 DIAGNOSIS — Z1231 Encounter for screening mammogram for malignant neoplasm of breast: Secondary | ICD-10-CM

## 2018-03-13 ENCOUNTER — Other Ambulatory Visit: Payer: Self-pay | Admitting: Family Medicine

## 2018-03-13 MED FILL — SYMBICORT 80-4.5 MCG INH: 80-4.5 | 30 days supply | Qty: 10 | Fill #5

## 2018-03-13 MED FILL — HYDROCHLOROTHIAZIDE 25 MG T: 25 | 90 days supply | Qty: 90 | Fill #1

## 2018-03-15 MED FILL — SIMVASTATIN 20 MG TABLET: 20 | 90 days supply | Qty: 90 | Fill #0

## 2018-03-15 MED FILL — AMLODIPINE BESYLATE 5 MG TA: 5 | 90 days supply | Qty: 90 | Fill #0

## 2018-03-17 DIAGNOSIS — N6320 Unspecified lump in the left breast, unspecified quadrant: Secondary | ICD-10-CM | POA: Diagnosis not present

## 2018-03-17 DIAGNOSIS — Z6829 Body mass index (BMI) 29.0-29.9, adult: Secondary | ICD-10-CM | POA: Diagnosis not present

## 2018-03-17 DIAGNOSIS — Z01419 Encounter for gynecological examination (general) (routine) without abnormal findings: Secondary | ICD-10-CM | POA: Diagnosis not present

## 2018-03-21 ENCOUNTER — Other Ambulatory Visit: Payer: Self-pay | Admitting: Obstetrics and Gynecology

## 2018-03-21 DIAGNOSIS — N6320 Unspecified lump in the left breast, unspecified quadrant: Secondary | ICD-10-CM

## 2018-03-31 MED FILL — FLUTICASONE PROP 50 MCG SPR: 50 | 30 days supply | Qty: 16 | Fill #5

## 2018-03-31 MED FILL — metFORMIN HCL ER 500 MG TB2: 500 | 90 days supply | Qty: 180 | Fill #1

## 2018-04-03 ENCOUNTER — Ambulatory Visit
Admission: RE | Admit: 2018-04-03 | Discharge: 2018-04-03 | Disposition: A | Discharge status: Home / Self Care | Payer: 59 | Source: Ambulatory Visit | Attending: Obstetrics and Gynecology | Admitting: Obstetrics and Gynecology

## 2018-04-03 ENCOUNTER — Ambulatory Visit
Admission: RE | Admit: 2018-04-03 | Discharge: 2018-04-03 | Disposition: A | Discharge status: Home / Self Care | Payer: PPO | Source: Ambulatory Visit | Attending: Obstetrics and Gynecology | Admitting: Obstetrics and Gynecology

## 2018-04-03 DIAGNOSIS — R928 Other abnormal and inconclusive findings on diagnostic imaging of breast: Secondary | ICD-10-CM | POA: Diagnosis not present

## 2018-04-03 DIAGNOSIS — N6489 Other specified disorders of breast: Secondary | ICD-10-CM | POA: Diagnosis not present

## 2018-04-03 DIAGNOSIS — N6320 Unspecified lump in the left breast, unspecified quadrant: Secondary | ICD-10-CM

## 2018-04-04 ENCOUNTER — Ambulatory Visit (HOSPITAL_BASED_OUTPATIENT_CLINIC_OR_DEPARTMENT_OTHER): Payer: 59

## 2018-04-04 ENCOUNTER — Ambulatory Visit (HOSPITAL_BASED_OUTPATIENT_CLINIC_OR_DEPARTMENT_OTHER)
Admission: RE | Admit: 2018-04-04 | Discharge: 2018-04-04 | Disposition: A | Discharge status: Home / Self Care | Payer: PPO | Source: Ambulatory Visit | Attending: Family Medicine | Admitting: Family Medicine

## 2018-04-04 DIAGNOSIS — M8580 Other specified disorders of bone density and structure, unspecified site: Secondary | ICD-10-CM | POA: Diagnosis not present

## 2018-04-04 DIAGNOSIS — Z79890 Hormone replacement therapy (postmenopausal): Secondary | ICD-10-CM | POA: Diagnosis not present

## 2018-05-04 ENCOUNTER — Other Ambulatory Visit: Payer: Self-pay | Admitting: Family Medicine

## 2018-05-05 MED FILL — SYMBICORT 80-4.5 MCG INH: 80-4.5 | 30 days supply | Qty: 10 | Fill #0

## 2018-05-09 ENCOUNTER — Encounter: Payer: Self-pay | Admitting: Family Medicine

## 2018-05-09 ENCOUNTER — Other Ambulatory Visit: Payer: Self-pay

## 2018-05-09 ENCOUNTER — Other Ambulatory Visit (HOSPITAL_COMMUNITY)
Admission: RE | Admit: 2018-05-09 | Discharge: 2018-05-09 | Disposition: A | Discharge status: Home / Self Care | Payer: PPO | Source: Ambulatory Visit | Attending: Family Medicine | Admitting: Family Medicine

## 2018-05-09 ENCOUNTER — Ambulatory Visit (INDEPENDENT_AMBULATORY_CARE_PROVIDER_SITE_OTHER): Payer: PPO | Admitting: Family Medicine

## 2018-05-09 VITALS — BP 132/68 | HR 98 | Temp 99.1°F | Resp 12

## 2018-05-09 DIAGNOSIS — R300 Dysuria: Secondary | ICD-10-CM | POA: Diagnosis not present

## 2018-05-09 LAB — POC URINALSYSI DIPSTICK (AUTOMATED)
Bilirubin, UA: NEGATIVE
Glucose, UA: NEGATIVE
Ketones, UA: NEGATIVE
Leukocytes, UA: NEGATIVE
NITRITE UA: NEGATIVE
Protein, UA: NEGATIVE
RBC UA: NEGATIVE
Spec Grav, UA: <=1.005 — AB (ref 1.010–1.025)
UROBILINOGEN UA: 0.2 U/dL
pH, UA: 7 (ref 5.0–8.0)

## 2018-05-09 MED ORDER — CIPROFLOXACIN HCL 250 MG PO TABS: 250.0000 mg | ORAL_TABLET | Freq: Two times a day (BID) | ORAL | 0 refills | Status: DC

## 2018-05-09 NOTE — Progress Notes (Signed)
Patient ID: Michelle Michelle [name] Michelle [name], female    DOB: [birth year]  Age: 63 y.o. MRN: [id number]    Subjective:  Subjective  HPI Michelle Michelle [name] Michelle [name] presents for dysuria , frequency for 3 days   No nausea/ vomiting  No fever   Review of Systems  Constitutional: Negative for appetite change, diaphoresis, fatigue and unexpected weight change.  Eyes: Negative for pain, redness and visual disturbance.  Respiratory: Negative for cough, chest tightness, shortness of breath and wheezing.   Cardiovascular: Negative for chest pain, palpitations and leg swelling.  Endocrine: Negative for cold intolerance, heat intolerance, polydipsia, polyphagia and polyuria.  Genitourinary: Positive for dysuria and frequency. Negative for difficulty urinating.  Neurological: Negative for dizziness, light-headedness, numbness and headaches.    History Past Medical History:  Diagnosis Date   Allergy    Asthma    Bronchitis    COPD (chronic obstructive pulmonary disease) (HCC)    mild   Diabetes mellitus without complication ([place])    Hyperlipidemia    Hypertension    stress induced/ work makes it worse.   Kidney stones    Obesity    Thyroid disease     She has a past surgical history that includes Tonsillectomy and adenoidectomy (childhood); Tubal ligation; and Vaginal hysterectomy (2011).   Her family history includes Colon polyps in her brother; Heart disease in her father and mother; Hyperlipidemia in her mother; Hypertension in her mother; Prostate cancer in her brother; Stroke in her mother.She reports that she has never smoked. She has never used smokeless tobacco. She reports that she does not drink alcohol or use drugs.  Current Outpatient Medications on File Prior to Visit  Medication Sig Dispense Refill   albuterol (PROVENTIL HFA;VENTOLIN HFA) 108 (90 BASE) MCG/ACT inhaler INhale 1 puffs into lungs every 8 hours prn wheezing or SOB 1 Inhaler 0   ALPHA LIPOIC ACID PO Take 1-2 capsules by mouth  2 (two) times daily.       AMBULATORY NON FORMULARY MEDICATION Medication Name: Biest/Progesterone/Testosterone/DHEA 2.5/250/0.5/2mg      amLODipine (NORVASC) 5 MG tablet TAKE 1 TABLET (5 MG TOTAL) BY MOUTH DAILY. 90 tablet 1   Calcium-Magnesium (CAL-MAG PO) Take 1 tablet by mouth daily.     Cholecalciferol (VITAMIN D3 PO) Take 5,000 Units by mouth daily.     fluticasone (FLONASE) 50 MCG/ACT nasal spray Place 1 spray into both nostrils daily.     hydrochlorothiazide (HYDRODIURIL) 25 MG tablet Take 1 tablet (25 mg total) by mouth daily. 90 tablet 1   metFORMIN (GLUCOPHAGE-XR) 500 MG 24 hr tablet TAKE 1 TABLET (500 MG TOTAL) BY MOUTH 2 (TWO) TIMES DAILY. 180 tablet 1   Methylcobalamin 50000 MCG SOLR Inject 5,000 mcg as directed 2 (two) times a week.     MULTIPLE VITAMIN PO Take 1 tablet by mouth daily.       NATURE-THROID 32.5 MG tablet Take 1 tablet by mouth daily.  3   NON FORMULARY Take 1 tablet by mouth 2 (two) times daily. Heal-n-Soothe Enzymes     PROBIOTIC CAPS Take 1 capsule by mouth daily.       propranolol (INDERAL) 10 MG tablet Take 5 mg by mouth 3 (three) times daily.     simvastatin (ZOCOR) 20 MG tablet TAKE 1 TABLET (20 MG TOTAL) BY MOUTH EVERY EVENING. 90 tablet 1   SYMBICORT 80-4.5 MCG/ACT inhaler INHALE 2 PUFFS BY MOUTH INTO THE LUNGS TWICE A DAY 10.2 g 5   Current Facility-Administered Medications on File Prior to Visit  Medication Dose Route Frequency Provider Last Rate Last Dose   0.9 %  sodium chloride infusion  500 mL Intravenous Continuous Nandigam, Venia Minks, MD         Objective:  Objective  Physical Exam Vitals signs and nursing note reviewed.  Abdominal:     General: There is no distension.     Palpations: Abdomen is soft.     Tenderness: There is no abdominal tenderness. There is no right CVA tenderness, left CVA tenderness, guarding or rebound.  Genitourinary:    Exam position: Supine.     Labia:        Right: No rash, tenderness, lesion or  injury.        Left: No rash, tenderness, lesion or injury.      Vagina: No vaginal discharge, erythema or tenderness.    BP 132/68 (BP Location: Left Arm, Cuff Size: Normal)    Pulse 98    Temp 99.1 F (37.3 C) (Oral)    Resp 12    SpO2 99%  Wt Readings from Last 3 Encounters:  11/10/16 147 lb (66.7 kg)  10/27/16 147 lb 9.6 oz (67 kg)  10/19/16 143 lb 3.2 oz (65 kg)     Lab Results  Component Value Date   WBC 7.5 07/14/2017   HGB 14.1 07/14/2017   HCT 42.4 07/14/2017   PLT 275.0 07/14/2017   GLUCOSE 98 01/17/2018   CHOL 172 01/17/2018   TRIG 74.0 01/17/2018   HDL 62.60 01/17/2018   LDLCALC 95 01/17/2018   ALT 18 01/17/2018   AST 16 01/17/2018   NA 138 01/17/2018   K 4.0 01/17/2018   CL 100 01/17/2018   CREATININE 0.74 01/17/2018   BUN 22 01/17/2018   CO2 31 01/17/2018   TSH 1.14 01/17/2018   HGBA1C 5.6 01/17/2018   MICROALBUR <0.7 07/14/2017    Dg Bone Density  Result Date: 04/04/2018 EXAM: DUAL X-RAY ABSORPTIOMETRY (DXA) FOR BONE MINERAL DENSITY IMPRESSION: Michelle Michelle [name] Michelle [name] Your patient Michelle Michelle [name] Michelle [name] completed a BMD test on 04/04/2018 using the [place] (analysis version: 16.SP2) manufactured by [employer]. The following summarizes the results of our evaluation. [name] Michelle [name], Michelle [name] Patient ID: [id number] Birth Date: 28-Aug-1955 Height: 63.0 in. Gender: Female Measured: 04/04/2018 Weight: 164.2 lbs. Indications: Caucasian, Diabetic, Estrogen Deficiency, Family History of Osteoporosis, History of Osteopenia, Hypothyroidism, Hysterectomy, Parent hip Fx, Post-Menopausal Fractures: Humerus, Shoulder Treatments: Calcium, Estrogen/Hormone Therapy, Inhaler, Multivitamin, Naturethyroid, Vitamin D ASSESSMENT: The BMD measured at Forearm Radius 33% is 0.832 g/cm2 with a T-score of -0.5. This patient is considered normal according to [facility]) criteria. Scan quality was good. Site Region Measured Date Measured Age WHO YA BMD  Classification T-score AP Spine L1-L4 04/04/2018 65.0 Normal 0.4 1.241 g/cm2 AP Spine L1-L4 04/01/2016 63.0 Normal -0.1 1.179 g/cm2 AP Spine L1-L4 03/31/2015 62.0 Normal 0.0 1.189 g/cm2 AP Spine L1-L4 03/06/2013 59.9 Normal -0.3 1.154 g/cm2 DualFemur Total Mean 04/04/2018 65.0 Normal 1.1 1.145 g/cm2 DualFemur Total Mean 04/01/2016 63.0 Normal 1.0 1.136 g/cm2 DualFemur Total Mean 03/31/2015 62.0 Normal 1.1 1.149 g/cm2 DualFemur Total Mean 03/06/2013 59.9 Normal 0.7 1.100 g/cm2 Left Forearm Radius 33% 04/04/2018 65.0 Normal -0.5 0.832 g/cm2 World Health Organization [facility]) criteria for post-menopausal, Caucasian Women: Normal       T-score at or above -1 SD Osteopenia   T-score between -1 and -2.5 SD Osteoporosis T-score at or below -2.5 SD RECOMMENDATION: 1. All patients should optimize calcium and vitamin D intake. 2. Consider FDA-approved  medical therapies in postmenopausal women and men aged 65 years and older, based on the following: a. A hip or vertebral(clinical or morphometric) fracture. b. T-Score < -2.5 at the femoral neck or spine after appropriate evaluation to exclude secondary causes c. Low bone mass (T-score between -1.0 and -2.5 at the femoral neck or spine) and a 10 year probability of a hip fracture >3% or a 10 year probability of major osteoporosis-related fracture > 20% based on the [place]-adapted WHO algorithm d. Clinical judgement and/or patient preferences may indicate treatment for people with 10-year fracture probabilities above or below these levels FOLLOW-UP: Patients with diagnosis of osteoporosis or at high risk for fracture should have regular bone mineral density tests. For patients eligible for Medicare, routine testing is allowed once every 2 years. The testing frequency can be increased to one year for patients who have rapidly progressing disease, those who are receiving or discontinuing medical therapy to restore bone mass, or have additional risk factors. I have reviewed this report,  anf agree with the above findings. [facility] Electronically Signed   By: Dorise Bullion III M.D   On: 04/04/2018 12:34     Assessment & Plan:  Plan  I have discontinued Jana Half L. Deshotel's nystatin. I am also having her start on ciprofloxacin. Additionally, I am having her maintain her MULTIPLE VITAMIN PO, Probiotic, ALPHA LIPOIC ACID PO, AMBULATORY NON FORMULARY MEDICATION, Calcium-Magnesium (CAL-MAG PO), albuterol, Nature-Throid, NON FORMULARY, propranolol, Methylcobalamin, fluticasone, hydrochlorothiazide, metFORMIN, Cholecalciferol (VITAMIN D3 PO), amLODipine, simvastatin, and Symbicort. We will continue to administer sodium chloride.  Meds ordered this encounter  Medications   ciprofloxacin (CIPRO) 250 MG tablet    Sig: Take 1 tablet (250 mg total) by mouth 2 (two) times daily.    Dispense:  6 tablet    Refill:  0    Problem List Items Addressed This Visit    None    Visit Diagnoses    Dysuria    -  Primary   Relevant Medications   ciprofloxacin (CIPRO) 250 MG tablet   Other Relevant Orders   POCT Urinalysis Dipstick (Automated) (Completed)   Urine Culture   Urine cytology ancillary only([place])      Follow-up: Return if symptoms worsen or fail to improve.  [name], DO

## 2018-05-09 NOTE — Patient Instructions (Signed)

## 2018-05-10 LAB — URINE CULTURE
MICRO NUMBER:: 348879
Result:: NO GROWTH
SPECIMEN QUALITY:: ADEQUATE

## 2018-05-10 LAB — URINE CYTOLOGY ANCILLARY ONLY
Chlamydia: NEGATIVE
NEISSERIA GONORRHEA: NEGATIVE
Trichomonas: NEGATIVE

## 2018-05-11 LAB — URINE CYTOLOGY ANCILLARY ONLY
Bacterial vaginitis: NEGATIVE
Candida vaginitis: NEGATIVE

## 2018-05-11 MED FILL — NYSTATIN 100,000 UNITS/ML S: 100000 | 3 days supply | Qty: 60 | Fill #1

## 2018-05-12 MED FILL — FLUCONAZOLE 150 MG TABS: 150 | 6 days supply | Qty: 3 | Fill #0

## 2018-06-06 ENCOUNTER — Other Ambulatory Visit: Payer: Self-pay | Admitting: Family Medicine

## 2018-06-06 DIAGNOSIS — E785 Hyperlipidemia, unspecified: Secondary | ICD-10-CM

## 2018-06-06 DIAGNOSIS — I10 Essential (primary) hypertension: Secondary | ICD-10-CM

## 2018-06-06 MED FILL — AMLODIPINE BESYLATE 5 MG TA: 5 | 90 days supply | Qty: 90 | Fill #1

## 2018-06-06 MED FILL — HYDROCHLOROTHIAZIDE 25 MG T: 25 | 90 days supply | Qty: 90 | Fill #0

## 2018-06-06 MED FILL — SIMVASTATIN 20 MG TABLET: 20 | 90 days supply | Qty: 90 | Fill #1

## 2018-06-26 ENCOUNTER — Other Ambulatory Visit: Payer: Self-pay | Admitting: Family Medicine

## 2018-06-26 DIAGNOSIS — IMO0002 Reserved for concepts with insufficient information to code with codable children: Secondary | ICD-10-CM

## 2018-06-26 DIAGNOSIS — E1151 Type 2 diabetes mellitus with diabetic peripheral angiopathy without gangrene: Secondary | ICD-10-CM

## 2018-06-26 MED FILL — metFORMIN HCL ER 500 MG TB2: 500 | 90 days supply | Qty: 180 | Fill #0

## 2018-07-13 MED FILL — SYMBICORT 80-4.5 MCG INH: 80-4.5 | 30 days supply | Qty: 10 | Fill #1

## 2018-07-17 ENCOUNTER — Encounter: Payer: Self-pay | Admitting: Family Medicine

## 2018-07-17 ENCOUNTER — Other Ambulatory Visit: Payer: Self-pay

## 2018-07-17 ENCOUNTER — Ambulatory Visit (INDEPENDENT_AMBULATORY_CARE_PROVIDER_SITE_OTHER): Payer: PPO | Admitting: Family Medicine

## 2018-07-17 DIAGNOSIS — E785 Hyperlipidemia, unspecified: Secondary | ICD-10-CM | POA: Diagnosis not present

## 2018-07-17 DIAGNOSIS — E1169 Type 2 diabetes mellitus with other specified complication: Secondary | ICD-10-CM

## 2018-07-17 DIAGNOSIS — E039 Hypothyroidism, unspecified: Secondary | ICD-10-CM | POA: Diagnosis not present

## 2018-07-17 DIAGNOSIS — E1165 Type 2 diabetes mellitus with hyperglycemia: Secondary | ICD-10-CM

## 2018-07-17 NOTE — Progress Notes (Signed)
Virtual Visit via Video Note  I connected with Michelle [name] on 07/17/18 at 10:00 AM EDT by a video enabled telemedicine application and verified that I am speaking with the correct person using two identifiers.  Location: Patient: home  Provider: home    I discussed the limitations of evaluation and management by telemedicine and the availability of in person appointments. The patient expressed understanding and agreed to proceed.  History of Present Illness: HYPERTENSION   Blood pressure range-good per pt  Chest pain- no      Dyspnea- no Lightheadedness- no   Edema- no  Other side effects - no   Medication compliance: good Low salt diet- yes    DIABETES    Blood Sugar ranges-below 100 if fasting   Polyuria- no New Visual problems- no  Hypoglycemic symptoms- no  Other side effects-no Medication compliance - good Last eye exam- dec 2019    HYPERLIPIDEMIA  Medication compliance- good  RUQ pain- no  Muscle aches- no Other side effects-no       Observations/Objective: .bp 120/70   80s  Afebrile Pt in NAD  Assessment and Plan: 1. Uncontrolled type 2 diabetes mellitus with hyperglycemia (HCC) Check labs  - Hemoglobin A1c; Future  2. Hypothyroidism, unspecified type Check labs  - TSH; Future  3. Hyperlipidemia associated with type 2 diabetes mellitus ([place]) Encouraged heart healthy diet, increase exercise, avoid trans fats, consider a krill oil cap daily - Lipid panel; Future - Comprehensive metabolic panel; Future   Follow Up Instructions:    I discussed the assessment and treatment plan with the patient. The patient was provided an opportunity to ask questions and all were answered. The patient agreed with the plan and demonstrated an understanding of the instructions.   The patient was advised to call back or seek an in-person evaluation if the symptoms worsen or if the condition fails to improve as anticipated.  I provided 25 minutes of  non-face-to-face time during this encounter.   [name], DO

## 2018-07-18 ENCOUNTER — Ambulatory Visit: Payer: 59 | Admitting: Family Medicine

## 2018-07-24 ENCOUNTER — Other Ambulatory Visit (INDEPENDENT_AMBULATORY_CARE_PROVIDER_SITE_OTHER): Payer: PPO

## 2018-07-24 ENCOUNTER — Other Ambulatory Visit: Payer: Self-pay

## 2018-07-24 DIAGNOSIS — E785 Hyperlipidemia, unspecified: Secondary | ICD-10-CM

## 2018-07-24 DIAGNOSIS — E039 Hypothyroidism, unspecified: Secondary | ICD-10-CM

## 2018-07-24 DIAGNOSIS — E1169 Type 2 diabetes mellitus with other specified complication: Secondary | ICD-10-CM | POA: Diagnosis not present

## 2018-07-24 DIAGNOSIS — E1165 Type 2 diabetes mellitus with hyperglycemia: Secondary | ICD-10-CM

## 2018-07-24 LAB — LIPID PANEL
Cholesterol: 157 mg/dL (ref 0–200)
HDL: 58.7 mg/dL (ref >39.00)
LDL Cholesterol: 85 mg/dL (ref 0–99)
NonHDL: 98.57
Total CHOL/HDL Ratio: 3
Triglycerides: 68 mg/dL (ref 0.0–149.0)
VLDL: 13.6 mg/dL (ref 0.0–40.0)

## 2018-07-24 LAB — COMPREHENSIVE METABOLIC PANEL
ALT: 19 U/L (ref 0–35)
AST: 17 U/L (ref 0–37)
Albumin: 4.5 g/dL (ref 3.5–5.2)
Alkaline Phosphatase: 33 U/L — ABNORMAL LOW (ref 39–117)
BUN: 19 mg/dL (ref 6–23)
CO2: 29 mEq/L (ref 19–32)
Calcium: 9.5 mg/dL (ref 8.4–10.5)
Chloride: 97 mEq/L (ref 96–112)
Creatinine, Ser: 0.7 mg/dL (ref 0.40–1.20)
GFR: 83.88 mL/min (ref >60.00)
Glucose, Bld: 92 mg/dL (ref 70–99)
Potassium: 3.7 mEq/L (ref 3.5–5.1)
Sodium: 135 mEq/L (ref 135–145)
Total Bilirubin: 1.2 mg/dL (ref 0.2–1.2)
Total Protein: 6.9 g/dL (ref 6.0–8.3)

## 2018-07-24 LAB — TSH: TSH: 1.38 u[IU]/mL (ref 0.35–4.50)

## 2018-07-24 LAB — HEMOGLOBIN A1C: Hgb A1c MFr Bld: 5.6 % (ref 4.6–6.5)

## 2018-08-02 NOTE — Progress Notes (Signed)
Pt viewed via Mychart 

## 2018-08-03 ENCOUNTER — Encounter: Payer: Self-pay | Admitting: Family Medicine

## 2018-09-04 MED FILL — HYDROCHLOROTHIAZIDE 25 MG T: 25 | 90 days supply | Qty: 90 | Fill #1

## 2018-09-05 ENCOUNTER — Other Ambulatory Visit: Payer: Self-pay | Admitting: Family Medicine

## 2018-09-05 MED FILL — SYMBICORT 80-4.5 MCG INH: 80-4.5 | 30 days supply | Qty: 10 | Fill #2

## 2018-09-06 ENCOUNTER — Other Ambulatory Visit: Payer: Self-pay | Admitting: Family Medicine

## 2018-09-06 MED FILL — AMLODIPINE BESYLATE 5 MG TA: 5 | 90 days supply | Qty: 90 | Fill #0

## 2018-09-06 MED FILL — SIMVASTATIN 20 MG TABLET: 20 | 90 days supply | Qty: 90 | Fill #0

## 2018-09-13 ENCOUNTER — Encounter: Payer: Self-pay | Admitting: Family Medicine

## 2018-09-13 ENCOUNTER — Other Ambulatory Visit: Payer: Self-pay

## 2018-09-14 ENCOUNTER — Ambulatory Visit (INDEPENDENT_AMBULATORY_CARE_PROVIDER_SITE_OTHER): Payer: PPO | Admitting: Family Medicine

## 2018-09-14 ENCOUNTER — Encounter: Payer: Self-pay | Admitting: Family Medicine

## 2018-09-14 VITALS — BP 165/85 | HR 99 | Temp 98.2°F | Ht 64.0 in | Wt 155.0 lb

## 2018-09-14 DIAGNOSIS — Z20822 Contact with and (suspected) exposure to covid-19: Secondary | ICD-10-CM

## 2018-09-14 DIAGNOSIS — Z20828 Contact with and (suspected) exposure to other viral communicable diseases: Secondary | ICD-10-CM

## 2018-09-14 DIAGNOSIS — R070 Pain in throat: Secondary | ICD-10-CM

## 2018-09-14 DIAGNOSIS — R0981 Nasal congestion: Secondary | ICD-10-CM | POA: Diagnosis not present

## 2018-09-14 DIAGNOSIS — M791 Myalgia, unspecified site: Secondary | ICD-10-CM

## 2018-09-14 NOTE — Progress Notes (Signed)
Virtual Visit via Video Note  I connected with Michelle [name] on 09/14/18 at  3:30 PM EDT by a video enabled telemedicine application and verified that I am speaking with the correct person using two identifiers.  Location: Patient: home  Provider: office    I discussed the limitations of evaluation and management by telemedicine and the availability of in person appointments. The patient expressed understanding and agreed to proceed.  History of Present Illness: Pt is home c/o bodyaches ,  Scratchy throat .   No fevers   + nasal congestion  No other complaints    Observations/Objective: Vitals:   09/14/18 1529  BP: (!) 165/85  Pulse: 99  Temp: 98.2 F (36.8 C)   Pt is in NAD  Assessment and Plan: 1. Close Exposure to Covid-19 Virus Pt will get tested tomorrow con't antihistamine and flonase  Call  If symptoms worsen  - Novel Coronavirus, NAA ([facility])   Follow Up Instructions:    I discussed the assessment and treatment plan with the patient. The patient was provided an opportunity to ask questions and all were answered. The patient agreed with the plan and demonstrated an understanding of the instructions.   The patient was advised to call back or seek an in-person evaluation if the symptoms worsen or if the condition fails to improve as anticipated.  I provided 15 minutes of non-face-to-face time during this encounter.   [name], DO

## 2018-09-15 ENCOUNTER — Other Ambulatory Visit: Payer: Self-pay

## 2018-09-15 DIAGNOSIS — R6889 Other general symptoms and signs: Secondary | ICD-10-CM | POA: Diagnosis not present

## 2018-09-15 DIAGNOSIS — Z20822 Contact with and (suspected) exposure to covid-19: Secondary | ICD-10-CM

## 2018-09-17 LAB — NOVEL CORONAVIRUS, NAA: SARS-CoV-2, NAA: NOT DETECTED

## 2018-09-22 MED FILL — METFORMIN HCL ER 500 MG TB2: 500 | 90 days supply | Qty: 180 | Fill #1

## 2018-10-18 ENCOUNTER — Encounter: Payer: Self-pay | Admitting: Family Medicine

## 2018-10-18 DIAGNOSIS — E119 Type 2 diabetes mellitus without complications: Secondary | ICD-10-CM | POA: Diagnosis not present

## 2018-10-19 DIAGNOSIS — S0990XA Unspecified injury of head, initial encounter: Secondary | ICD-10-CM | POA: Diagnosis not present

## 2018-10-19 DIAGNOSIS — R51 Headache: Secondary | ICD-10-CM | POA: Diagnosis not present

## 2018-10-19 DIAGNOSIS — I10 Essential (primary) hypertension: Secondary | ICD-10-CM | POA: Diagnosis not present

## 2018-10-19 DIAGNOSIS — Z043 Encounter for examination and observation following other accident: Secondary | ICD-10-CM | POA: Diagnosis not present

## 2018-10-19 DIAGNOSIS — S3991XA Unspecified injury of abdomen, initial encounter: Secondary | ICD-10-CM | POA: Diagnosis not present

## 2018-10-19 DIAGNOSIS — S8011XA Contusion of right lower leg, initial encounter: Secondary | ICD-10-CM | POA: Diagnosis not present

## 2018-10-19 DIAGNOSIS — R1030 Lower abdominal pain, unspecified: Secondary | ICD-10-CM | POA: Diagnosis not present

## 2018-10-19 DIAGNOSIS — M542 Cervicalgia: Secondary | ICD-10-CM | POA: Diagnosis not present

## 2018-10-19 DIAGNOSIS — S299XXA Unspecified injury of thorax, initial encounter: Secondary | ICD-10-CM | POA: Diagnosis not present

## 2018-10-19 DIAGNOSIS — G8911 Acute pain due to trauma: Secondary | ICD-10-CM | POA: Diagnosis not present

## 2018-10-19 DIAGNOSIS — R69 Illness, unspecified: Secondary | ICD-10-CM | POA: Diagnosis not present

## 2018-10-19 DIAGNOSIS — R52 Pain, unspecified: Secondary | ICD-10-CM | POA: Diagnosis not present

## 2018-10-19 DIAGNOSIS — S161XXA Strain of muscle, fascia and tendon at neck level, initial encounter: Secondary | ICD-10-CM | POA: Diagnosis not present

## 2018-10-19 DIAGNOSIS — S3993XA Unspecified injury of pelvis, initial encounter: Secondary | ICD-10-CM | POA: Diagnosis not present

## 2018-10-25 ENCOUNTER — Other Ambulatory Visit: Payer: Self-pay

## 2018-10-26 ENCOUNTER — Ambulatory Visit (INDEPENDENT_AMBULATORY_CARE_PROVIDER_SITE_OTHER): Payer: PPO | Admitting: Family Medicine

## 2018-10-26 ENCOUNTER — Encounter: Payer: Self-pay | Admitting: Family Medicine

## 2018-10-26 ENCOUNTER — Telehealth: Payer: Self-pay

## 2018-10-26 VITALS — BP 122/68 | HR 98 | Temp 97.8°F | Resp 18 | Ht 64.0 in | Wt 159.2 lb

## 2018-10-26 DIAGNOSIS — Z20828 Contact with and (suspected) exposure to other viral communicable diseases: Secondary | ICD-10-CM

## 2018-10-26 DIAGNOSIS — S134XXA Sprain of ligaments of cervical spine, initial encounter: Secondary | ICD-10-CM

## 2018-10-26 DIAGNOSIS — Z20822 Contact with and (suspected) exposure to covid-19: Secondary | ICD-10-CM | POA: Insufficient documentation

## 2018-10-26 MED ORDER — CYCLOBENZAPRINE HCL 10 MG PO TABS: 10.0000 mg | ORAL_TABLET | Freq: Three times a day (TID) | ORAL | 0 refills | Status: DC | PRN

## 2018-10-26 MED FILL — CYCLOBENZAPRINE HCL 10 MG T: 10 | 10 days supply | Qty: 30 | Fill #0

## 2018-10-26 NOTE — Assessment & Plan Note (Signed)
Flexeril sent to pharmacy Symptoms improving Moist heat

## 2018-10-26 NOTE — Assessment & Plan Note (Signed)
From all the people that stopped to help without masks on Pt requesting ab tests

## 2018-10-26 NOTE — Telephone Encounter (Signed)
PA approved through 10/26/2019.

## 2018-10-26 NOTE — Patient Instructions (Signed)
Concussion, Adult  A concussion is a brain injury from a hard, direct hit (trauma) to the head or body. This direct hit causes the brain to shake quickly back and forth inside the skull. This can damage brain cells and cause chemical changes in the brain. A concussion may also be known as a mild traumatic brain injury (TBI). Concussions are usually not life-threatening, but the effects of a concussion can be serious. If you have a concussion, you should be very careful to avoid having a second concussion. What are the causes? This condition is caused by:  A direct hit to your head, such as: ? Running into another player during a game. ? Being hit in a fight. ? Hitting your head on a hard surface.  Sudden movement of your body that causes your brain to move back and forth inside the skull, such as in a car crash. What are the signs or symptoms? The signs of a concussion can be hard to notice. Early on, they may be missed by you, family members, and health care providers. You may look fine on the outside but may act or feel differently. Symptoms are usually temporary and most often improve in 7-10 days. Some symptoms appear right away, but other symptoms may not show up for hours or days. If your symptoms last longer than normal, you may have post-concussion syndrome. Every head injury is different. Physical symptoms  Headaches. This can include a feeling of pressure in the head or migraine-like symptoms.  Tiredness (fatigue).  Dizziness.  Problems with coordination or balance.  Vision or hearing problems.  Sensitivity to light or noise.  Nausea or vomiting.  Changes in eating or sleeping patterns.  Numbness or tingling.  Seizure. Mental and emotional symptoms  Memory problems.  Trouble concentrating, organizing, or making decisions.  Slowness in thinking, acting or reacting, speaking, or reading.  Irritability or mood changes.  Anxiety or depression. How is this  diagnosed? This condition is diagnosed based on:  Your symptoms.  A description of your injury. You may also have tests, including:  Imaging tests, such as a CT scan or MRI.  Neuropsychological tests. These measure your thinking, understanding, learning, and remembering abilities. How is this treated? Treatment for this condition includes:  Stopping sports or activity if you are injured. If you hit your head or show signs of concussion: ? Do not return to sports or activities the same day. ? Get checked by a health care provider before you return to your activities.  Physical and mental rest and careful observation, usually at home. Gradually return to your normal activities.  Medicines to help with symptoms such as headaches, nausea, or difficulty sleeping. ? Avoid taking opioid pain medicine while recovering from a concussion.  Avoiding alcohol and drugs. These may slow your recovery and can put you at risk of further injury.  Referral to a concussion clinic or rehabilitation center. Recovery from a concussion can take time. How fast you recover depends on many factors. Return to activities only when:  Your symptoms are completely gone.  Your health care provider says that it is safe. Follow these instructions at home: Activity  Limit activities that require a lot of thought or concentration, such as: ? Doing homework or job-related work. ? Watching TV. ? Working on the computer or phone. ? Playing memory games and puzzles.  Rest. Rest helps your brain heal. Make sure you: ? Get plenty of sleep. Most adults should get 7-9 hours of sleep   each night. ? Rest during the day. Take naps or rest breaks when you feel tired.  Avoid physical activity like exercise until your health care provider says it is safe. Stop any activity that worsens symptoms.  Do not do high-risk activities that could cause a second concussion, such as riding a bike or playing sports.  Ask your  health care provider when you can return to your normal activities, such as school, work, athletics, and driving. Your ability to react may be slower after a brain injury. Never do these activities if you are dizzy. Your health care provider will likely give you a plan for gradually returning to activities. General instructions   Take over-the-counter and prescription medicines only as told by your health care provider. Some medicines, such as blood thinners (anticoagulants) and aspirin, may increase the risk for complications, such as bleeding.  Do not drink alcohol until your health care provider says you can.  Watch your symptoms and tell others around you to do the same. Complications sometimes occur after a concussion. Older adults with a brain injury may have a higher risk of serious complications.  Tell your work manager, teachers, school nurse, school counselor, coach, or athletic trainer about your injury, symptoms, and restrictions.  Keep all follow-up visits as told by your health care provider. This is important. How is this prevented? Avoiding another brain injury is very important. In rare cases, another injury can lead to permanent brain damage, brain swelling, or death. The risk of this is greatest during the first 7-10 days after a head injury. Avoid injuries by:  Stopping activities that could lead to a second concussion, such as contact or recreational sports, until your health care provider says it is okay.  Taking these actions once you have returned to sports or activities: ? Avoiding plays or moves that can cause you to crash into another person. This is how most concussions occur. ? Following the rules and being respectful of other players. Do not engage in violent or illegal plays.  Getting regular exercise that includes strength and balance training.  Wearing a properly fitting helmet during sports, biking, or other activities. Helmets can help protect you from  serious skull and brain injuries, but they do not protect you from a concussion. Even when wearing a helmet, you should avoid being hit in the head. Contact a health care provider if:  Your symptoms get worse or they do not improve.  You have new symptoms.  You have another injury. Get help right away if:  You have severe or worsening headaches.  You have weakness or numbness in any part of your body.  You are confused.  Your coordination gets worse.  You vomit repeatedly.  You are sleepier than normal.  Your speech is slurred.  You cannot recognize people or places.  You have a seizure.  It is difficult to wake you up.  You have unusual behavior changes.  You have changes in your vision.  You lose consciousness. Summary  A concussion is a brain injury that results from a hard, direct hit (trauma) to your head or body.  You may have imaging tests and neuropsychological tests to diagnose a concussion.  Treatment for this condition includes physical and mental rest and careful observation.  Ask your health care provider when you can return to your normal activities, such as school, work, athletics, and driving.  Get help right away if you have a severe headache, weakness on one side of the   body, seizures, behavior changes, changes in vision, or if you are confused or sleepier than normal. This information is not intended to replace advice given to you by your health care provider. Make sure you discuss any questions you have with your health care provider. Document Released: 04/24/2003 Document Revised: 09/22/2017 Document Reviewed: 09/22/2017 Elsevier Patient Education  2020 [employer].

## 2018-10-26 NOTE — Telephone Encounter (Signed)
PA initiated via Covermymeds; KEY: [id number]. Awaiting determination.
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
## 2018-10-27 LAB — SARS-COV-2 IGG: SARS-COV-2 IgG: 0.02

## 2018-11-09 MED FILL — SYMBICORT 80-4.5 MCG INH: 80-4.5 | 30 days supply | Qty: 10 | Fill #3

## 2018-11-13 ENCOUNTER — Other Ambulatory Visit: Payer: Self-pay

## 2018-11-13 ENCOUNTER — Encounter: Payer: Self-pay | Admitting: Family Medicine

## 2018-11-13 ENCOUNTER — Ambulatory Visit (INDEPENDENT_AMBULATORY_CARE_PROVIDER_SITE_OTHER): Payer: PPO | Admitting: Family Medicine

## 2018-11-13 DIAGNOSIS — S134XXA Sprain of ligaments of cervical spine, initial encounter: Secondary | ICD-10-CM | POA: Diagnosis not present

## 2018-11-13 DIAGNOSIS — M545 Low back pain, unspecified: Secondary | ICD-10-CM | POA: Insufficient documentation

## 2018-11-13 DIAGNOSIS — M5441 Lumbago with sciatica, right side: Secondary | ICD-10-CM

## 2018-11-13 MED ORDER — CYCLOBENZAPRINE HCL 10 MG PO TABS: 10.0000 mg | ORAL_TABLET | Freq: Three times a day (TID) | ORAL | 0 refills | Status: DC | PRN

## 2018-11-13 MED FILL — CYCLOBENZAPRINE HCL 10 MG T: 10 | 10 days supply | Qty: 30 | Fill #0

## 2018-11-13 NOTE — Assessment & Plan Note (Signed)
Improving Pt seeing chiropractor today
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
## 2018-12-07 ENCOUNTER — Other Ambulatory Visit: Payer: Self-pay | Admitting: Family Medicine

## 2018-12-07 DIAGNOSIS — E785 Hyperlipidemia, unspecified: Secondary | ICD-10-CM

## 2018-12-07 DIAGNOSIS — I10 Essential (primary) hypertension: Secondary | ICD-10-CM

## 2018-12-07 MED FILL — SIMVASTATIN 20 MG TABLET: 20 | 90 days supply | Qty: 90 | Fill #1

## 2018-12-07 MED FILL — AMLODIPINE BESYLATE 5 MG TA: 5 | 90 days supply | Qty: 90 | Fill #1

## 2018-12-11 MED FILL — HYDROCHLOROTHIAZIDE 25 MG T: 25 | 90 days supply | Qty: 90 | Fill #0

## 2019-01-01 ENCOUNTER — Other Ambulatory Visit: Payer: Self-pay | Admitting: Family Medicine

## 2019-01-01 DIAGNOSIS — IMO0002 Reserved for concepts with insufficient information to code with codable children: Secondary | ICD-10-CM

## 2019-01-01 DIAGNOSIS — E1151 Type 2 diabetes mellitus with diabetic peripheral angiopathy without gangrene: Secondary | ICD-10-CM

## 2019-01-01 MED FILL — SYMBICORT 80-4.5 MCG INH: 80-4.5 | 30 days supply | Qty: 10 | Fill #4

## 2019-01-01 MED FILL — METFORMIN HCL ER 500 MG TB2: 500 | 90 days supply | Qty: 180 | Fill #0

## 2019-01-08 MED FILL — valACYclovir HCL 1 GM TABS: 1 | 30 days supply | Qty: 60 | Fill #0

## 2019-01-08 MED FILL — CYCLOBENZAPRINE HCL 10 MG T: 10 | 10 days supply | Qty: 30 | Fill #0

## 2019-01-17 DIAGNOSIS — H524 Presbyopia: Secondary | ICD-10-CM | POA: Diagnosis not present

## 2019-01-17 DIAGNOSIS — H25813 Combined forms of age-related cataract, bilateral: Secondary | ICD-10-CM | POA: Diagnosis not present

## 2019-01-17 DIAGNOSIS — H52223 Regular astigmatism, bilateral: Secondary | ICD-10-CM | POA: Diagnosis not present

## 2019-01-17 DIAGNOSIS — E119 Type 2 diabetes mellitus without complications: Secondary | ICD-10-CM | POA: Diagnosis not present

## 2019-01-17 DIAGNOSIS — H5203 Hypermetropia, bilateral: Secondary | ICD-10-CM | POA: Diagnosis not present

## 2019-02-01 MED FILL — valACYclovir HCL 1 GM TABS: 1 | 30 days supply | Qty: 60 | Fill #1

## 2019-02-22 ENCOUNTER — Other Ambulatory Visit: Payer: Self-pay | Admitting: Family Medicine

## 2019-02-22 MED FILL — AMLODIPINE BESYLATE 5 MG TA: 5 | 90 days supply | Qty: 90 | Fill #0

## 2019-02-22 MED FILL — HYDROCHLOROTHIAZIDE 25 MG T: 25 | 90 days supply | Qty: 90 | Fill #1

## 2019-02-22 MED FILL — SIMVASTATIN 20 MG TABLET: 20 | 90 days supply | Qty: 90 | Fill #0

## 2019-02-22 NOTE — Telephone Encounter (Signed)
Last OV 11/13/18 Last refill(s) 09/06/18 #90/1 Next OV not scheduled

## 2019-02-26 MED FILL — SYMBICORT 80-4.5 MCG INH: 80-4.5 | 30 days supply | Qty: 10 | Fill #5

## 2019-03-28 MED FILL — metFORMIN HCL ER 500 MG TB2: 500 | 90 days supply | Qty: 180 | Fill #1

## 2019-04-06 ENCOUNTER — Ambulatory Visit (HOSPITAL_BASED_OUTPATIENT_CLINIC_OR_DEPARTMENT_OTHER)
Admission: RE | Admit: 2019-04-06 | Discharge: 2019-04-06 | Disposition: A | Discharge status: Home / Self Care | Payer: PPO | Source: Ambulatory Visit | Attending: Family Medicine | Admitting: Family Medicine

## 2019-04-06 ENCOUNTER — Other Ambulatory Visit: Payer: Self-pay

## 2019-04-06 DIAGNOSIS — Z1231 Encounter for screening mammogram for malignant neoplasm of breast: Secondary | ICD-10-CM | POA: Diagnosis not present

## 2019-04-07 ENCOUNTER — Ambulatory Visit: Payer: PPO | Attending: Internal Medicine

## 2019-04-07 DIAGNOSIS — Z23 Encounter for immunization: Secondary | ICD-10-CM | POA: Insufficient documentation

## 2019-04-07 NOTE — Progress Notes (Signed)
   Covid-19 Vaccination Clinic  Name:  Michelle [name]    MRN: [id number] DOB: 03-25-1955  04/07/2019  [name] was observed post Covid-19 immunization for 30 minutes based on pre-vaccination screening without incidence. She was provided with Vaccine Information Sheet and instruction to access the V-Safe system.   [name] was instructed to call 911 with any severe reactions post vaccine: Marland Kitchen Difficulty breathing  . Swelling of your face and throat  . A fast heartbeat  . A bad rash all over your body  . Dizziness and weakness    Immunizations Administered    Name Date Dose VIS Date Route   Pfizer COVID-19 Vaccine 04/07/2019 12:10 PM 0.3 mL 01/26/2019 Intramuscular   Manufacturer: [place]   Lot: [id number]   [place]: [id number]

## 2019-04-23 ENCOUNTER — Other Ambulatory Visit: Payer: Self-pay

## 2019-04-24 ENCOUNTER — Ambulatory Visit (INDEPENDENT_AMBULATORY_CARE_PROVIDER_SITE_OTHER): Payer: PPO | Admitting: Family Medicine

## 2019-04-24 ENCOUNTER — Other Ambulatory Visit: Payer: Self-pay

## 2019-04-24 ENCOUNTER — Encounter: Payer: Self-pay | Admitting: Family Medicine

## 2019-04-24 VITALS — BP 130/60 | HR 102 | Temp 97.9°F | Resp 18 | Ht 64.0 in | Wt 165.8 lb

## 2019-04-24 DIAGNOSIS — E785 Hyperlipidemia, unspecified: Secondary | ICD-10-CM

## 2019-04-24 DIAGNOSIS — E559 Vitamin D deficiency, unspecified: Secondary | ICD-10-CM | POA: Insufficient documentation

## 2019-04-24 DIAGNOSIS — E1165 Type 2 diabetes mellitus with hyperglycemia: Secondary | ICD-10-CM | POA: Diagnosis not present

## 2019-04-24 DIAGNOSIS — M79604 Pain in right leg: Secondary | ICD-10-CM | POA: Diagnosis not present

## 2019-04-24 NOTE — Assessment & Plan Note (Signed)
Check labs 

## 2019-04-24 NOTE — Assessment & Plan Note (Signed)
Check labs  hgba1c to be checked, minimize simple carbs. Increase exercise as tolerated. Continue current meds  

## 2019-04-24 NOTE — Assessment & Plan Note (Signed)
Some spider veins No pain on palp today If it does not completely resolve--- sport med
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
## 2019-04-24 NOTE — Assessment & Plan Note (Signed)
Tolerating statin, encouraged heart healthy diet, avoid trans fats, minimize simple carbs and saturated fats. Increase exercise as tolerated 

## 2019-04-25 ENCOUNTER — Other Ambulatory Visit (INDEPENDENT_AMBULATORY_CARE_PROVIDER_SITE_OTHER): Payer: PPO

## 2019-04-25 ENCOUNTER — Telehealth: Payer: Self-pay

## 2019-04-25 DIAGNOSIS — E559 Vitamin D deficiency, unspecified: Secondary | ICD-10-CM

## 2019-04-25 DIAGNOSIS — E785 Hyperlipidemia, unspecified: Secondary | ICD-10-CM

## 2019-04-25 DIAGNOSIS — E1165 Type 2 diabetes mellitus with hyperglycemia: Secondary | ICD-10-CM

## 2019-04-25 LAB — COMPREHENSIVE METABOLIC PANEL
ALT: 19 U/L (ref 0–35)
AST: 17 U/L (ref 0–37)
Albumin: 4.1 g/dL (ref 3.5–5.2)
Alkaline Phosphatase: 25 U/L — ABNORMAL LOW (ref 39–117)
BUN: 13 mg/dL (ref 6–23)
CO2: 29 mEq/L (ref 19–32)
Calcium: 9.7 mg/dL (ref 8.4–10.5)
Chloride: 102 mEq/L (ref 96–112)
Creatinine, Ser: 0.8 mg/dL (ref 0.40–1.20)
GFR: 71.74 mL/min (ref >60.00)
Glucose, Bld: 101 mg/dL — ABNORMAL HIGH (ref 70–99)
Potassium: 4.8 mEq/L (ref 3.5–5.1)
Sodium: 137 mEq/L (ref 135–145)
Total Bilirubin: 0.7 mg/dL (ref 0.2–1.2)
Total Protein: 6.6 g/dL (ref 6.0–8.3)

## 2019-04-25 LAB — VITAMIN D 25 HYDROXY (VIT D DEFICIENCY, FRACTURES): VITD: 106.06 ng/mL (ref 30.00–100.00)

## 2019-04-25 LAB — HEMOGLOBIN A1C: Hgb A1c MFr Bld: 5.5 % (ref 4.6–6.5)

## 2019-04-25 LAB — LIPID PANEL
Cholesterol: 144 mg/dL (ref 0–200)
HDL: 57.4 mg/dL (ref >39.00)
LDL Cholesterol: 73 mg/dL (ref 0–99)
NonHDL: 86.59
Total CHOL/HDL Ratio: 3
Triglycerides: 69 mg/dL (ref 0.0–149.0)
VLDL: 13.8 mg/dL (ref 0.0–40.0)

## 2019-04-25 NOTE — Telephone Encounter (Signed)
[facility] called with a critical of vitamin D of 106.06   Please advise.

## 2019-04-26 NOTE — Telephone Encounter (Signed)
Vitd is very high --- she only really needs 1000 u daily but she should hold it for 1-2 weeks

## 2019-04-26 NOTE — Telephone Encounter (Signed)
Patient notified and verbalized understanding. 

## 2019-05-01 ENCOUNTER — Ambulatory Visit: Payer: PPO | Attending: Internal Medicine

## 2019-05-01 DIAGNOSIS — Z23 Encounter for immunization: Secondary | ICD-10-CM

## 2019-05-01 NOTE — Progress Notes (Signed)
   Covid-19 Vaccination Clinic  Name:  Michelle [name]    MRN: [id number] DOB: 11-Mar-1955  05/01/2019  [name] was observed post Covid-19 immunization for 30 minutes based on pre-vaccination screening without incident. She was provided with Vaccine Information Sheet and instruction to access the V-Safe system.   [name] was instructed to call 911 with any severe reactions post vaccine: Marland Kitchen Difficulty breathing  . Swelling of face and throat  . A fast heartbeat  . A bad rash all over body  . Dizziness and weakness   Immunizations Administered    Name Date Dose VIS Date Route   Pfizer COVID-19 Vaccine 05/01/2019 12:03 PM 0.3 mL 01/26/2019 Intramuscular   Manufacturer: [place]   Lot: [id number]   [place]: [id number]

## 2019-05-02 ENCOUNTER — Other Ambulatory Visit: Payer: Self-pay | Admitting: Family Medicine

## 2019-05-02 MED FILL — SYMBICORT 80-4.5 MCG INH: 80-4.5 | 30 days supply | Qty: 10 | Fill #0

## 2019-06-05 ENCOUNTER — Other Ambulatory Visit: Payer: Self-pay | Admitting: Family Medicine

## 2019-06-05 DIAGNOSIS — I10 Essential (primary) hypertension: Secondary | ICD-10-CM

## 2019-06-05 DIAGNOSIS — E785 Hyperlipidemia, unspecified: Secondary | ICD-10-CM

## 2019-06-05 MED FILL — SIMVASTATIN 20 MG TABLET: 20 | 90 days supply | Qty: 90 | Fill #1

## 2019-06-05 MED FILL — AMLODIPINE BESYLATE 5 MG TA: 5 | 90 days supply | Qty: 90 | Fill #1

## 2019-06-05 MED FILL — HYDROCHLOROTHIAZIDE 25 MG T: 25 | 90 days supply | Qty: 90 | Fill #0

## 2019-06-08 ENCOUNTER — Encounter: Payer: Self-pay | Admitting: Family Medicine

## 2019-06-08 NOTE — Telephone Encounter (Signed)
The yeast is in mouth and vaginal area The mouth wash is only for mouth So she would need ov

## 2019-06-08 NOTE — Telephone Encounter (Signed)
Please advise 

## 2019-06-12 ENCOUNTER — Other Ambulatory Visit: Payer: Self-pay | Admitting: Family Medicine

## 2019-06-12 DIAGNOSIS — B370 Candidal stomatitis: Secondary | ICD-10-CM

## 2019-06-12 MED ORDER — NYSTATIN 100000 UNIT/ML MT SUSP: 5.0000 mL | Freq: Four times a day (QID) | OROMUCOSAL | 0 refills | Status: DC

## 2019-06-12 MED FILL — NYSTATIN 100,000 UNITS/ML S: 100000 | 3 days supply | Qty: 60 | Fill #0

## 2019-06-12 NOTE — Telephone Encounter (Signed)
Nystatin sent downstairs

## 2019-06-12 NOTE — Telephone Encounter (Signed)
Spoke with patient regarding symptoms.  Patient reports she does not need treatment for vaginal/anal area, just her mouth.  She states her mouth is typically the last to heal, requesting Nystatin suspension prescription.

## 2019-06-13 NOTE — Telephone Encounter (Signed)
Patient notified of prescription.

## 2019-06-19 ENCOUNTER — Other Ambulatory Visit: Payer: Self-pay | Admitting: Family Medicine

## 2019-06-19 ENCOUNTER — Telehealth: Payer: Self-pay

## 2019-06-19 ENCOUNTER — Other Ambulatory Visit: Payer: Self-pay

## 2019-06-19 DIAGNOSIS — M79604 Pain in right leg: Secondary | ICD-10-CM

## 2019-06-19 DIAGNOSIS — IMO0002 Reserved for concepts with insufficient information to code with codable children: Secondary | ICD-10-CM

## 2019-06-19 DIAGNOSIS — E1151 Type 2 diabetes mellitus with diabetic peripheral angiopathy without gangrene: Secondary | ICD-10-CM

## 2019-06-19 MED FILL — metFORMIN HCL ER 500 MG TB2: 500 | 90 days supply | Qty: 180 | Fill #0
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
## 2019-06-19 NOTE — Telephone Encounter (Signed)
Patient called in to see if Dr. Etter Sjogren can send in a referral for a Sports  Medicine doctor. Please follow up with the patient once this is done at [phone]

## 2019-06-26 DIAGNOSIS — M25551 Pain in right hip: Secondary | ICD-10-CM | POA: Diagnosis not present

## 2019-06-26 DIAGNOSIS — M545 Low back pain: Secondary | ICD-10-CM | POA: Diagnosis not present

## 2019-06-26 DIAGNOSIS — M79604 Pain in right leg: Secondary | ICD-10-CM | POA: Diagnosis not present

## 2019-07-07 DIAGNOSIS — M545 Low back pain: Secondary | ICD-10-CM | POA: Diagnosis not present

## 2019-07-11 DIAGNOSIS — M545 Low back pain: Secondary | ICD-10-CM | POA: Diagnosis not present

## 2019-07-11 MED FILL — predniSONE 10 MG TABS: 10 | 12 days supply | Qty: 21 | Fill #0

## 2019-07-11 MED FILL — CYCLOBENZAPRINE HCL 10 MG T: 10 | 30 days supply | Qty: 60 | Fill #0

## 2019-07-25 ENCOUNTER — Encounter: Payer: Self-pay | Admitting: Family Medicine

## 2019-07-25 ENCOUNTER — Other Ambulatory Visit: Payer: Self-pay

## 2019-07-25 ENCOUNTER — Encounter: Payer: Self-pay | Admitting: Neurology

## 2019-07-25 ENCOUNTER — Telehealth (INDEPENDENT_AMBULATORY_CARE_PROVIDER_SITE_OTHER): Payer: PPO | Admitting: Family Medicine

## 2019-07-25 DIAGNOSIS — M545 Low back pain, unspecified: Secondary | ICD-10-CM

## 2019-07-25 DIAGNOSIS — R42 Dizziness and giddiness: Secondary | ICD-10-CM
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
## 2019-08-01 DIAGNOSIS — M545 Low back pain: Secondary | ICD-10-CM | POA: Diagnosis not present

## 2019-08-01 DIAGNOSIS — M25551 Pain in right hip: Secondary | ICD-10-CM | POA: Diagnosis not present

## 2019-08-01 DIAGNOSIS — M79604 Pain in right leg: Secondary | ICD-10-CM | POA: Diagnosis not present

## 2019-08-24 ENCOUNTER — Other Ambulatory Visit: Payer: Self-pay | Admitting: Family Medicine

## 2019-08-24 MED FILL — SYMBICORT 80-4.5 MCG INH: 80-4.5 | 30 days supply | Qty: 10 | Fill #0

## 2019-09-14 ENCOUNTER — Other Ambulatory Visit: Payer: Self-pay | Admitting: Family Medicine

## 2019-09-14 MED FILL — AMLODIPINE BESYLATE 5 MG TA: 5 | 90 days supply | Qty: 90 | Fill #0

## 2019-09-14 MED FILL — SIMVASTATIN 20 MG TABLET: 20 | 90 days supply | Qty: 90 | Fill #0

## 2019-09-14 MED FILL — metFORMIN HCL ER 500 MG TB2: 500 | 90 days supply | Qty: 180 | Fill #1

## 2019-09-14 MED FILL — HYDROCHLOROTHIAZIDE 25 MG T: 25 | 90 days supply | Qty: 90 | Fill #1

## 2019-09-27 ENCOUNTER — Ambulatory Visit: Payer: PPO | Admitting: Neurology

## 2019-09-27 ENCOUNTER — Encounter: Payer: Self-pay | Admitting: Neurology

## 2019-09-27 ENCOUNTER — Other Ambulatory Visit: Payer: Self-pay

## 2019-09-27 VITALS — BP 144/80 | HR 85 | Ht 63.0 in | Wt 163.2 lb

## 2019-09-27 DIAGNOSIS — R42 Dizziness and giddiness: Secondary | ICD-10-CM | POA: Diagnosis not present

## 2019-09-27 DIAGNOSIS — G5731 Lesion of lateral popliteal nerve, right lower limb: Secondary | ICD-10-CM
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
## 2019-09-27 NOTE — Patient Instructions (Signed)
Continue current management.  Follow up as needed.

## 2019-10-01 MED FILL — SYMBICORT 80-4.5 MCG INH: 80-4.5 | 30 days supply | Qty: 10 | Fill #1

## 2019-11-05 ENCOUNTER — Ambulatory Visit: Payer: PPO | Admitting: Neurology

## 2019-11-06 ENCOUNTER — Encounter: Payer: Self-pay | Admitting: Family Medicine

## 2019-11-06 ENCOUNTER — Other Ambulatory Visit: Payer: Self-pay

## 2019-11-06 ENCOUNTER — Ambulatory Visit (INDEPENDENT_AMBULATORY_CARE_PROVIDER_SITE_OTHER): Payer: PPO | Admitting: Family Medicine

## 2019-11-06 ENCOUNTER — Other Ambulatory Visit: Payer: Self-pay | Admitting: Family Medicine

## 2019-11-06 VITALS — BP 130/70 | HR 90 | Temp 98.5°F | Resp 18 | Ht 63.0 in | Wt 162.0 lb

## 2019-11-06 DIAGNOSIS — E559 Vitamin D deficiency, unspecified: Secondary | ICD-10-CM

## 2019-11-06 DIAGNOSIS — E119 Type 2 diabetes mellitus without complications: Secondary | ICD-10-CM

## 2019-11-06 DIAGNOSIS — E1151 Type 2 diabetes mellitus with diabetic peripheral angiopathy without gangrene: Secondary | ICD-10-CM | POA: Diagnosis not present

## 2019-11-06 DIAGNOSIS — E785 Hyperlipidemia, unspecified: Secondary | ICD-10-CM | POA: Diagnosis not present

## 2019-11-06 DIAGNOSIS — I10 Essential (primary) hypertension: Secondary | ICD-10-CM | POA: Diagnosis not present

## 2019-11-06 DIAGNOSIS — E1165 Type 2 diabetes mellitus with hyperglycemia: Secondary | ICD-10-CM

## 2019-11-06 DIAGNOSIS — E1169 Type 2 diabetes mellitus with other specified complication: Secondary | ICD-10-CM

## 2019-11-06 DIAGNOSIS — IMO0002 Reserved for concepts with insufficient information to code with codable children: Secondary | ICD-10-CM

## 2019-11-06 MED ORDER — HYDROCHLOROTHIAZIDE 25 MG PO TABS: 25.0000 mg | ORAL_TABLET | Freq: Every day | ORAL | 1 refills | Status: DC

## 2019-11-06 MED ORDER — METFORMIN HCL ER 500 MG PO TB24: ORAL_TABLET | ORAL | 1 refills | Status: DC

## 2019-11-06 NOTE — Patient Instructions (Signed)
Carbohydrate Counting for Diabetes Mellitus, Adult  Carbohydrate counting is a method of keeping track of how many carbohydrates you eat. Eating carbohydrates naturally increases the amount of sugar (glucose) in the blood. Counting how many carbohydrates you eat helps keep your blood glucose within normal limits, which helps you manage your diabetes (diabetes mellitus). It is important to know how many carbohydrates you can safely have in each meal. This is different for every person. A diet and nutrition specialist (registered dietitian) can help you make a meal plan and calculate how many carbohydrates you should have at each meal and snack. Carbohydrates are found in the following foods:  Grains, such as breads and cereals.  Dried beans and soy products.  Starchy vegetables, such as potatoes, peas, and corn.  Fruit and fruit juices.  Milk and yogurt.  Sweets and snack foods, such as cake, cookies, candy, chips, and soft drinks. How do I count carbohydrates? There are two ways to count carbohydrates in food. You can use either of the methods or a combination of both. Reading "Nutrition Facts" on packaged food The "Nutrition Facts" list is included on the labels of almost all packaged foods and beverages in the [place] It includes:  The serving size.  Information about nutrients in each serving, including the grams (g) of carbohydrate per serving. To use the "Nutrition Facts":  Decide how many servings you will have.  Multiply the number of servings by the number of carbohydrates per serving.  The resulting number is the total amount of carbohydrates that you will be having. Learning standard serving sizes of other foods When you eat carbohydrate foods that are not packaged or do not include "Nutrition Facts" on the label, you need to measure the servings in order to count the amount of carbohydrates:  Measure the foods that you will eat with a food scale or measuring cup, if  needed.  Decide how many standard-size servings you will eat.  Multiply the number of servings by 15. Most carbohydrate-rich foods have about 15 g of carbohydrates per serving. ? For example, if you eat 8 oz (170 g) of strawberries, you will have eaten 2 servings and 30 g of carbohydrates (2 servings x 15 g = 30 g).  For foods that have more than one food mixed, such as soups and casseroles, you must count the carbohydrates in each food that is included. The following list contains standard serving sizes of common carbohydrate-rich foods. Each of these servings has about 15 g of carbohydrates:   hamburger bun or  English muffin.   oz (15 mL) syrup.   oz (14 g) jelly.  1 slice of bread.  1 six-inch tortilla.  3 oz (85 g) cooked rice or pasta.  4 oz (113 g) cooked dried beans.  4 oz (113 g) starchy vegetable, such as peas, corn, or potatoes.  4 oz (113 g) hot cereal.  4 oz (113 g) mashed potatoes or  of a large baked potato.  4 oz (113 g) canned or frozen fruit.  4 oz (120 mL) fruit juice.  4-6 crackers.  6 chicken nuggets.  6 oz (170 g) unsweetened dry cereal.  6 oz (170 g) plain fat-free yogurt or yogurt sweetened with artificial sweeteners.  8 oz (240 mL) milk.  8 oz (170 g) fresh fruit or one small piece of fruit.  24 oz (680 g) popped popcorn. Example of carbohydrate counting Sample meal  3 oz (85 g) chicken breast.  6 oz (170 g)   brown rice.  4 oz (113 g) corn.  8 oz (240 mL) milk.  8 oz (170 g) strawberries with sugar-free whipped topping. Carbohydrate calculation 1. Identify the foods that contain carbohydrates: ? Rice. ? Corn. ? Milk. ? Strawberries. 2. Calculate how many servings you have of each food: ? 2 servings rice. ? 1 serving corn. ? 1 serving milk. ? 1 serving strawberries. 3. Multiply each number of servings by 15 g: ? 2 servings rice x 15 g = 30 g. ? 1 serving corn x 15 g = 15 g. ? 1 serving milk x 15 g = 15 g. ? 1  serving strawberries x 15 g = 15 g. 4. Add together all of the amounts to find the total grams of carbohydrates eaten: ? 30 g + 15 g + 15 g + 15 g = 75 g of carbohydrates total. Summary  Carbohydrate counting is a method of keeping track of how many carbohydrates you eat.  Eating carbohydrates naturally increases the amount of sugar (glucose) in the blood.  Counting how many carbohydrates you eat helps keep your blood glucose within normal limits, which helps you manage your diabetes.  A diet and nutrition specialist (registered dietitian) can help you make a meal plan and calculate how many carbohydrates you should have at each meal and snack. This information is not intended to replace advice given to you by your health care provider. Make sure you discuss any questions you have with your health care provider. Document Revised: 08/26/2016 Document Reviewed: 07/16/2015 Elsevier Patient Education  2020 Elsevier Inc.  
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
## 2019-11-06 NOTE — Assessment & Plan Note (Signed)
Well controlled, no changes to meds. Encouraged heart healthy diet such as the DASH diet and exercise as tolerated.  °

## 2019-11-06 NOTE — Assessment & Plan Note (Signed)
hgba1c to be checked, minimize simple carbs. Increase exercise as tolerated. Continue current meds  

## 2019-11-06 NOTE — Assessment & Plan Note (Signed)
Encouraged heart healthy diet, increase exercise, avoid trans fats, consider a krill oil cap daily 

## 2019-11-07 LAB — COMPREHENSIVE METABOLIC PANEL
AG Ratio: 2 (calc) (ref 1.0–2.5)
ALT: 12 U/L (ref 6–29)
AST: 15 U/L (ref 10–35)
Albumin: 4.7 g/dL (ref 3.6–5.1)
Alkaline phosphatase (APISO): 32 U/L — ABNORMAL LOW (ref 37–153)
BUN: 14 mg/dL (ref 7–25)
CO2: 26 mmol/L (ref 20–32)
Calcium: 9.8 mg/dL (ref 8.6–10.4)
Chloride: 98 mmol/L (ref 98–110)
Creat: 0.74 mg/dL (ref 0.50–0.99)
Globulin: 2.3 g/dL (calc) (ref 1.9–3.7)
Glucose, Bld: 96 mg/dL (ref 65–99)
Potassium: 4.7 mmol/L (ref 3.5–5.3)
Sodium: 135 mmol/L (ref 135–146)
Total Bilirubin: 1 mg/dL (ref 0.2–1.2)
Total Protein: 7 g/dL (ref 6.1–8.1)

## 2019-11-07 LAB — LIPID PANEL
Cholesterol: 140 mg/dL
HDL: 59 mg/dL
LDL Cholesterol (Calc): 64 mg/dL
Non-HDL Cholesterol (Calc): 81 mg/dL
Total CHOL/HDL Ratio: 2.4 (calc)
Triglycerides: 86 mg/dL

## 2019-11-07 LAB — HEMOGLOBIN A1C
Hgb A1c MFr Bld: 5.2 %{Hb}
Mean Plasma Glucose: 103 (calc)
eAG (mmol/L): 5.7 (calc)

## 2019-11-07 LAB — VITAMIN D 25 HYDROXY (VIT D DEFICIENCY, FRACTURES): Vit D, 25-Hydroxy: 72 ng/mL (ref 30–100)

## 2019-11-07 LAB — MICROALBUMIN / CREATININE URINE RATIO
Creatinine, Urine: 41 mg/dL (ref 20–275)
Microalb Creat Ratio: 5 ug/mg{creat}
Microalb, Ur: 0.2 mg/dL

## 2019-11-26 ENCOUNTER — Other Ambulatory Visit: Payer: Self-pay | Admitting: Family Medicine

## 2019-11-26 MED FILL — SIMVASTATIN 20 MG TABLET: 20 | 90 days supply | Qty: 90 | Fill #0

## 2019-12-10 ENCOUNTER — Other Ambulatory Visit: Payer: Self-pay | Admitting: Family Medicine

## 2019-12-10 MED FILL — metFORMIN HCL ER 500 MG TB2: 500 | 90 days supply | Qty: 180 | Fill #0

## 2019-12-10 MED FILL — HYDROCHLOROTHIAZIDE 25 MG T: 25 | 90 days supply | Qty: 90 | Fill #0

## 2019-12-11 ENCOUNTER — Other Ambulatory Visit: Payer: Self-pay | Admitting: Family Medicine

## 2019-12-11 MED FILL — SYMBICORT 80-4.5 MCG INH: 80-4.5 | 30 days supply | Qty: 10 | Fill #0

## 2019-12-11 MED FILL — AMLODIPINE BESYLATE 5 MG TA: 5 | 90 days supply | Qty: 90 | Fill #0

## 2019-12-14 ENCOUNTER — Other Ambulatory Visit: Payer: Self-pay | Admitting: Family Medicine

## 2019-12-14 ENCOUNTER — Encounter: Payer: Self-pay | Admitting: Family Medicine

## 2019-12-14 MED ORDER — ALBUTEROL SULFATE HFA 108 (90 BASE) MCG/ACT IN AERS
INHALATION_SPRAY | RESPIRATORY_TRACT | 1 refills | Status: DC
Start: 2019-12-14 — End: 2019-12-14

## 2019-12-14 MED FILL — ALBUTEROL SULFATE HFA 108 (: 108 (90 BAS | 67 days supply | Qty: 9 | Fill #0

## 2019-12-14 NOTE — Telephone Encounter (Signed)
I refilled inhaler Michelle [name]----  I can see some stuff from her er visit in care everywhere but we can not print it (I dont think so anyway)  She would have to sign a release in order for [place] to get the records--- correct?

## 2020-01-28 ENCOUNTER — Other Ambulatory Visit: Payer: Self-pay | Admitting: Family Medicine

## 2020-02-16 HISTORY — PX: BREAST EXCISIONAL BIOPSY: SUR124

## 2020-02-26 MED FILL — SIMVASTATIN 20 MG TABLET: 20 | 90 days supply | Qty: 90 | Fill #0

## 2020-02-26 MED FILL — AMLODIPINE BESYLATE 5 MG TA: 5 | 90 days supply | Qty: 90 | Fill #1

## 2020-02-26 MED FILL — SYMBICORT 80-4.5 MCG INH: 80-4.5 | 30 days supply | Qty: 10 | Fill #1

## 2020-02-26 MED FILL — HYDROCHLOROTHIAZIDE 25 MG T: 25 | 90 days supply | Qty: 90 | Fill #1

## 2020-02-28 ENCOUNTER — Other Ambulatory Visit (HOSPITAL_BASED_OUTPATIENT_CLINIC_OR_DEPARTMENT_OTHER): Payer: Self-pay | Admitting: Family Medicine

## 2020-02-28 ENCOUNTER — Other Ambulatory Visit: Payer: Self-pay | Admitting: Family Medicine

## 2020-02-28 ENCOUNTER — Telehealth: Payer: Self-pay | Admitting: Family Medicine

## 2020-02-28 DIAGNOSIS — Z1231 Encounter for screening mammogram for malignant neoplasm of breast: Secondary | ICD-10-CM

## 2020-02-28 DIAGNOSIS — E2839 Other primary ovarian failure: Secondary | ICD-10-CM

## 2020-02-28 MED FILL — NYSTATIN 500,000 UNIT ORAL: 500000 | 30 days supply | Qty: 60 | Fill #0

## 2020-02-28 NOTE — Telephone Encounter (Signed)
Patient states she need an order for a bone density test sent to [facility] (downstairs)

## 2020-02-28 NOTE — Telephone Encounter (Signed)
Order in.

## 2020-02-28 NOTE — Telephone Encounter (Signed)
Please advise 

## 2020-03-04 ENCOUNTER — Ambulatory Visit: Payer: PPO

## 2020-03-10 ENCOUNTER — Other Ambulatory Visit (HOSPITAL_BASED_OUTPATIENT_CLINIC_OR_DEPARTMENT_OTHER): Payer: Self-pay | Admitting: Family Medicine

## 2020-03-10 MED FILL — valACYclovir HCL 1 GM TABS: 1 | 30 days supply | Qty: 60 | Fill #0

## 2020-03-25 ENCOUNTER — Encounter: Payer: Self-pay | Admitting: Family Medicine

## 2020-03-25 DIAGNOSIS — H524 Presbyopia: Secondary | ICD-10-CM | POA: Diagnosis not present

## 2020-03-25 DIAGNOSIS — H5201 Hypermetropia, right eye: Secondary | ICD-10-CM | POA: Diagnosis not present

## 2020-03-25 DIAGNOSIS — H25013 Cortical age-related cataract, bilateral: Secondary | ICD-10-CM | POA: Diagnosis not present

## 2020-03-25 DIAGNOSIS — H52223 Regular astigmatism, bilateral: Secondary | ICD-10-CM | POA: Diagnosis not present

## 2020-03-25 DIAGNOSIS — I10 Essential (primary) hypertension: Secondary | ICD-10-CM | POA: Diagnosis not present

## 2020-03-25 DIAGNOSIS — H35033 Hypertensive retinopathy, bilateral: Secondary | ICD-10-CM | POA: Diagnosis not present

## 2020-03-25 DIAGNOSIS — H5212 Myopia, left eye: Secondary | ICD-10-CM | POA: Diagnosis not present

## 2020-03-25 LAB — HM DIABETES EYE EXAM

## 2020-03-26 MED FILL — metFORMIN HCL ER 500 MG TB2: 500 | 90 days supply | Qty: 180 | Fill #1

## 2020-04-01 DIAGNOSIS — Z124 Encounter for screening for malignant neoplasm of cervix: Secondary | ICD-10-CM | POA: Diagnosis not present

## 2020-04-01 DIAGNOSIS — Z6830 Body mass index (BMI) 30.0-30.9, adult: Secondary | ICD-10-CM | POA: Diagnosis not present

## 2020-04-07 MED FILL — valACYclovir HCL 1 GM TABS: 1 | 30 days supply | Qty: 60 | Fill #1

## 2020-04-14 ENCOUNTER — Ambulatory Visit (HOSPITAL_BASED_OUTPATIENT_CLINIC_OR_DEPARTMENT_OTHER)
Admission: RE | Admit: 2020-04-14 | Discharge: 2020-04-14 | Disposition: A | Discharge status: Home / Self Care | Payer: PPO | Source: Ambulatory Visit | Attending: Family Medicine | Admitting: Family Medicine

## 2020-04-14 ENCOUNTER — Other Ambulatory Visit: Payer: Self-pay

## 2020-04-14 ENCOUNTER — Encounter (HOSPITAL_BASED_OUTPATIENT_CLINIC_OR_DEPARTMENT_OTHER): Payer: Self-pay

## 2020-04-14 DIAGNOSIS — Z13820 Encounter for screening for osteoporosis: Secondary | ICD-10-CM | POA: Insufficient documentation

## 2020-04-14 DIAGNOSIS — E2839 Other primary ovarian failure: Secondary | ICD-10-CM

## 2020-04-14 DIAGNOSIS — E119 Type 2 diabetes mellitus without complications: Secondary | ICD-10-CM | POA: Diagnosis not present

## 2020-04-14 DIAGNOSIS — Z780 Asymptomatic menopausal state: Secondary | ICD-10-CM | POA: Insufficient documentation

## 2020-04-14 DIAGNOSIS — E039 Hypothyroidism, unspecified: Secondary | ICD-10-CM | POA: Insufficient documentation

## 2020-04-14 DIAGNOSIS — Z1231 Encounter for screening mammogram for malignant neoplasm of breast: Secondary | ICD-10-CM | POA: Insufficient documentation

## 2020-04-17 ENCOUNTER — Other Ambulatory Visit: Payer: Self-pay | Admitting: Family Medicine

## 2020-04-17 DIAGNOSIS — R928 Other abnormal and inconclusive findings on diagnostic imaging of breast: Secondary | ICD-10-CM

## 2020-04-21 ENCOUNTER — Other Ambulatory Visit: Payer: Self-pay | Admitting: Family Medicine

## 2020-04-22 ENCOUNTER — Other Ambulatory Visit: Payer: Self-pay | Admitting: Family Medicine

## 2020-04-22 MED FILL — SYMBICORT 80-4.5 MCG INH: 80-4.5 | 30 days supply | Qty: 10 | Fill #0

## 2020-04-30 ENCOUNTER — Encounter: Payer: Self-pay | Admitting: Family Medicine

## 2020-04-30 ENCOUNTER — Other Ambulatory Visit: Payer: Self-pay | Admitting: Family Medicine

## 2020-04-30 ENCOUNTER — Other Ambulatory Visit: Payer: Self-pay

## 2020-04-30 ENCOUNTER — Telehealth (INDEPENDENT_AMBULATORY_CARE_PROVIDER_SITE_OTHER): Payer: PPO | Admitting: Family Medicine

## 2020-04-30 DIAGNOSIS — J189 Pneumonia, unspecified organism: Secondary | ICD-10-CM

## 2020-04-30 MED ORDER — AZITHROMYCIN 250 MG PO TABS
ORAL_TABLET | ORAL | 0 refills | Status: DC
Start: 2020-05-02 — End: 2020-05-13

## 2020-04-30 MED ORDER — PREDNISONE 20 MG PO TABS: 40.0000 mg | ORAL_TABLET | Freq: Every day | ORAL | 0 refills | Status: DC

## 2020-04-30 NOTE — Progress Notes (Signed)
Chief Complaint  Patient presents with  . Cough    Pt states having productive cough going on for x7 days. Pt states no fever. Pt states two home COVID test, both negative.     Rod Can here for URI complaints. Due to COVID-19 pandemic, we are interacting via telephone. I verified patient's ID using 2 identifiers. Patient agreed to proceed with visit via this method. Patient is at home, I am at office. Patient and I are present for visit.   Duration: 1 week   Exposed to cleaning materials and dust.  Associated symptoms: sinus congestion, rhinorrhea, ear fullness, wheezing, shortness of breath and cough Denies: sinus pain, itchy watery eyes, ear pain, ear drainage, sore throat, myalgia and fevers Treatment to date: Robitussin DM, SABA, doubled up on  Sick contacts: No  Tested neg for covid x 2.   Past Medical History:  Diagnosis Date  . Allergy   . Asthma   . Bronchitis   . COPD (chronic obstructive pulmonary disease) (HCC)    mild  . Diabetes mellitus without complication ([place])   . Hyperlipidemia   . Hypertension    stress induced/ work makes it worse.  . Kidney stones   . Obesity   . Thyroid disease    Objective No conversational dyspnea Age appropriate judgment and insight Nml affect and mood  Pneumonitis - Plan: azithromycin (ZITHROMAX) 250 MG tablet, predniSONE (DELTASONE) 20 MG tablet  It sounds like a pneumonitis irritating her lungs/asthma. 5 d pred burst, Zpak if she continues to worsen.  Continue to push fluids, practice good hand hygiene, cover mouth when coughing. F/u prn. If starting to experience fevers, shaking, or shortness of breath, seek immediate care. Total time: 11 min Pt voiced understanding and agreement to the plan.  [place], DO 04/30/20 2:28 PM

## 2020-05-02 ENCOUNTER — Other Ambulatory Visit: Payer: Self-pay | Admitting: Family Medicine

## 2020-05-02 ENCOUNTER — Ambulatory Visit
Admission: RE | Admit: 2020-05-02 | Discharge: 2020-05-02 | Disposition: A | Discharge status: Home / Self Care | Payer: PPO | Source: Ambulatory Visit | Attending: Family Medicine | Admitting: Family Medicine

## 2020-05-02 ENCOUNTER — Other Ambulatory Visit: Payer: Self-pay

## 2020-05-02 DIAGNOSIS — N6321 Unspecified lump in the left breast, upper outer quadrant: Secondary | ICD-10-CM | POA: Diagnosis not present

## 2020-05-02 DIAGNOSIS — R922 Inconclusive mammogram: Secondary | ICD-10-CM | POA: Diagnosis not present

## 2020-05-02 DIAGNOSIS — R928 Other abnormal and inconclusive findings on diagnostic imaging of breast: Secondary | ICD-10-CM | POA: Diagnosis not present

## 2020-05-02 DIAGNOSIS — N6323 Unspecified lump in the left breast, lower outer quadrant: Secondary | ICD-10-CM | POA: Diagnosis not present

## 2020-05-02 DIAGNOSIS — N6322 Unspecified lump in the left breast, upper inner quadrant: Secondary | ICD-10-CM | POA: Diagnosis not present

## 2020-05-05 ENCOUNTER — Other Ambulatory Visit: Payer: Self-pay

## 2020-05-05 ENCOUNTER — Ambulatory Visit
Admission: RE | Admit: 2020-05-05 | Discharge: 2020-05-05 | Disposition: A | Discharge status: Home / Self Care | Payer: PPO | Source: Ambulatory Visit | Attending: Family Medicine | Admitting: Family Medicine

## 2020-05-05 DIAGNOSIS — D0512 Intraductal carcinoma in situ of left breast: Secondary | ICD-10-CM | POA: Diagnosis not present

## 2020-05-05 DIAGNOSIS — R928 Other abnormal and inconclusive findings on diagnostic imaging of breast: Secondary | ICD-10-CM

## 2020-05-05 DIAGNOSIS — N6321 Unspecified lump in the left breast, upper outer quadrant: Secondary | ICD-10-CM | POA: Diagnosis not present

## 2020-05-05 DIAGNOSIS — Z170 Estrogen receptor positive status [ER+]: Secondary | ICD-10-CM | POA: Diagnosis not present

## 2020-05-05 DIAGNOSIS — C50812 Malignant neoplasm of overlapping sites of left female breast: Secondary | ICD-10-CM | POA: Diagnosis not present

## 2020-05-05 DIAGNOSIS — N6012 Diffuse cystic mastopathy of left breast: Secondary | ICD-10-CM | POA: Diagnosis not present

## 2020-05-05 MED FILL — SIMVASTATIN 20 MG TABLET: 20 | 90 days supply | Qty: 90 | Fill #0

## 2020-05-08 ENCOUNTER — Encounter: Payer: Self-pay | Admitting: Family Medicine

## 2020-05-08 NOTE — Telephone Encounter (Signed)
Z pak stays in your system 5 more days after last dose but she can take omnicef 300 mg bid x 5 days

## 2020-05-09 ENCOUNTER — Telehealth: Payer: Self-pay | Admitting: Medical

## 2020-05-09 ENCOUNTER — Other Ambulatory Visit: Payer: Self-pay | Admitting: Medical

## 2020-05-09 MED ORDER — CEFDINIR 300 MG PO CAPS: 300.0000 mg | ORAL_CAPSULE | Freq: Two times a day (BID) | ORAL | 0 refills | Status: DC

## 2020-05-09 MED FILL — CEFDINIR 300 MG CAPSULE: 300 | 5 days supply | Qty: 10 | Fill #0

## 2020-05-09 NOTE — Telephone Encounter (Signed)
Rx not sent in omnicef 300 mg bid. So I prescribed and gave her print rx. Explained to pt advise Dr. Etter Sjogren gave on my chart correspondence.

## 2020-05-09 NOTE — Telephone Encounter (Signed)
Ok to send in Michelle [name]--- but if no better she will need appointment

## 2020-05-09 NOTE — Telephone Encounter (Signed)
Patient states pharmacy has not received prescription.  [facility] [place], [facility], [place] [address]  Phone:  [phone] Fax:  [phone]

## 2020-05-12 ENCOUNTER — Other Ambulatory Visit: Payer: Self-pay

## 2020-05-13 ENCOUNTER — Other Ambulatory Visit: Payer: Self-pay | Admitting: Family Medicine

## 2020-05-13 ENCOUNTER — Ambulatory Visit (INDEPENDENT_AMBULATORY_CARE_PROVIDER_SITE_OTHER): Payer: PPO | Admitting: Family Medicine

## 2020-05-13 ENCOUNTER — Telehealth: Payer: Self-pay

## 2020-05-13 ENCOUNTER — Other Ambulatory Visit: Payer: Self-pay

## 2020-05-13 ENCOUNTER — Encounter: Payer: Self-pay | Admitting: Family Medicine

## 2020-05-13 VITALS — BP 150/66 | HR 91 | Temp 98.1°F | Resp 18 | Ht 63.0 in | Wt 165.8 lb

## 2020-05-13 DIAGNOSIS — E119 Type 2 diabetes mellitus without complications: Secondary | ICD-10-CM

## 2020-05-13 DIAGNOSIS — E1165 Type 2 diabetes mellitus with hyperglycemia: Secondary | ICD-10-CM

## 2020-05-13 DIAGNOSIS — Z0000 Encounter for general adult medical examination without abnormal findings: Secondary | ICD-10-CM

## 2020-05-13 DIAGNOSIS — E785 Hyperlipidemia, unspecified: Secondary | ICD-10-CM

## 2020-05-13 DIAGNOSIS — E559 Vitamin D deficiency, unspecified: Secondary | ICD-10-CM

## 2020-05-13 DIAGNOSIS — E1169 Type 2 diabetes mellitus with other specified complication: Secondary | ICD-10-CM

## 2020-05-13 LAB — LIPID PANEL
Cholesterol: 156 mg/dL (ref 0–200)
HDL: 68.7 mg/dL (ref >39.00)
LDL Cholesterol: 74 mg/dL (ref 0–99)
NonHDL: 87.26
Total CHOL/HDL Ratio: 2
Triglycerides: 64 mg/dL (ref 0.0–149.0)
VLDL: 12.8 mg/dL (ref 0.0–40.0)

## 2020-05-13 LAB — CBC WITH DIFFERENTIAL/PLATELET
Basophils Absolute: 0.1 10*3/uL (ref 0.0–0.1)
Basophils Relative: 0.7 % (ref 0.0–3.0)
Eosinophils Absolute: 0.4 10*3/uL (ref 0.0–0.7)
Eosinophils Relative: 4.9 % (ref 0.0–5.0)
HCT: 38.6 % (ref 36.0–46.0)
Hemoglobin: 13.3 g/dL (ref 12.0–15.0)
Lymphocytes Relative: 26.9 % (ref 12.0–46.0)
Lymphs Abs: 2.3 10*3/uL (ref 0.7–4.0)
MCHC: 34.3 g/dL (ref 30.0–36.0)
MCV: 89.8 fl (ref 78.0–100.0)
Monocytes Absolute: 0.7 10*3/uL (ref 0.1–1.0)
Monocytes Relative: 8.6 % (ref 3.0–12.0)
Neutro Abs: 5 10*3/uL (ref 1.4–7.7)
Neutrophils Relative %: 58.9 % (ref 43.0–77.0)
Platelets: 381 10*3/uL (ref 150.0–400.0)
RBC: 4.3 Mil/uL (ref 3.87–5.11)
RDW: 17 % — ABNORMAL HIGH (ref 11.5–15.5)
WBC: 8.5 10*3/uL (ref 4.0–10.5)

## 2020-05-13 LAB — COMPREHENSIVE METABOLIC PANEL
ALT: 17 U/L (ref 0–35)
AST: 17 U/L (ref 0–37)
Albumin: 4.4 g/dL (ref 3.5–5.2)
Alkaline Phosphatase: 42 U/L (ref 39–117)
BUN: 11 mg/dL (ref 6–23)
CO2: 31 mEq/L (ref 19–32)
Calcium: 9.7 mg/dL (ref 8.4–10.5)
Chloride: 96 mEq/L (ref 96–112)
Creatinine, Ser: 0.66 mg/dL (ref 0.40–1.20)
GFR: 90.93 mL/min (ref >60.00)
Glucose, Bld: 111 mg/dL — ABNORMAL HIGH (ref 70–99)
Potassium: 4.3 mEq/L (ref 3.5–5.1)
Sodium: 135 mEq/L (ref 135–145)
Total Bilirubin: 0.7 mg/dL (ref 0.2–1.2)
Total Protein: 7.1 g/dL (ref 6.0–8.3)

## 2020-05-13 LAB — VITAMIN D 25 HYDROXY (VIT D DEFICIENCY, FRACTURES): VITD: 111.43 ng/mL (ref 30.00–100.00)

## 2020-05-13 LAB — HEMOGLOBIN A1C: Hgb A1c MFr Bld: 6 % (ref 4.6–6.5)

## 2020-05-13 NOTE — Patient Instructions (Signed)
Preventive Care 61 Years and Older, Female Preventive care refers to lifestyle choices and visits with your health care provider that can promote health and wellness. This includes:  A yearly physical exam. This is also called an annual wellness visit.  Regular dental and eye exams.  Immunizations.  Screening for certain conditions.  Healthy lifestyle choices, such as: ? Eating a healthy diet. ? Getting regular exercise. ? Not using drugs or products that contain nicotine and tobacco. ? Limiting alcohol use. What can I expect for my preventive care visit? Physical exam Your health care provider will check your:  Height and weight. These may be used to calculate your BMI (body mass index). BMI is a measurement that tells if you are at a healthy weight.  Heart rate and blood pressure.  Body temperature.  Skin for abnormal spots. Counseling Your health care provider may ask you questions about your:  Past medical problems.  Family's medical history.  Alcohol, tobacco, and drug use.  Emotional well-being.  Home life and relationship well-being.  Sexual activity.  Diet, exercise, and sleep habits.  History of falls.  Memory and ability to understand (cognition).  Work and work Statistician.  Pregnancy and menstrual history.  Access to firearms. What immunizations do I need? Vaccines are usually given at various ages, according to a schedule. Your health care provider will recommend vaccines for you based on your age, medical history, and lifestyle or other factors, such as travel or where you work.   What tests do I need? Blood tests  Lipid and cholesterol levels. These may be checked every 5 years, or more often depending on your overall health.  Hepatitis C test.  Hepatitis B test. Screening  Lung cancer screening. You may have this screening every year starting at age 74 if you have a 30-pack-year history of smoking and currently smoke or have quit within  the past 15 years.  Colorectal cancer screening. ? All adults should have this screening starting at age 44 and continuing until age 58. ? Your health care provider may recommend screening at age 2 if you are at increased risk. ? You will have tests every 1-10 years, depending on your results and the type of screening test.  Diabetes screening. ? This is done by checking your blood sugar (glucose) after you have not eaten for a while (fasting). ? You may have this done every 1-3 years.  Mammogram. ? This may be done every 1-2 years. ? Talk with your health care provider about how often you should have regular mammograms.  Abdominal aortic aneurysm (AAA) screening. You may need this if you are a current or former smoker.  BRCA-related cancer screening. This may be done if you have a family history of breast, ovarian, tubal, or peritoneal cancers. Other tests  STD (sexually transmitted disease) testing, if you are at risk.  Bone density scan. This is done to screen for osteoporosis. You may have this done starting at age 104. Talk with your health care provider about your test results, treatment options, and if necessary, the need for more tests. Follow these instructions at home: Eating and drinking  Eat a diet that includes fresh fruits and vegetables, whole grains, lean protein, and low-fat dairy products. Limit your intake of foods with high amounts of sugar, saturated fats, and salt.  Take vitamin and mineral supplements as recommended by your health care provider.  Do not drink alcohol if your health care provider tells you not to drink.  If you drink alcohol: ? Limit how much you have to 0-1 drink a day. ? Be aware of how much alcohol is in your drink. In the [place] one drink equals one 12 oz bottle of beer (355 mL), one 5 oz glass of wine (148 mL), or one 1 oz glass of hard liquor (44 mL).   Lifestyle  Take daily care of your teeth and gums. Brush your teeth every morning  and night with fluoride toothpaste. Floss one time each day.  Stay active. Exercise for at least 30 minutes 5 or more days each week.  Do not use any products that contain nicotine or tobacco, such as cigarettes, e-cigarettes, and chewing tobacco. If you need help quitting, ask your health care provider.  Do not use drugs.  If you are sexually active, practice safe sex. Use a condom or other form of protection in order to prevent STIs (sexually transmitted infections).  Talk with your health care provider about taking a low-dose aspirin or statin.  Find healthy ways to cope with stress, such as: ? Meditation, yoga, or listening to music. ? Journaling. ? Talking to a trusted person. ? Spending time with friends and family. Safety  Always wear your seat belt while driving or riding in a vehicle.  Do not drive: ? If you have been drinking alcohol. Do not ride with someone who has been drinking. ? When you are tired or distracted. ? While texting.  Wear a helmet and other protective equipment during sports activities.  If you have firearms in your house, make sure you follow all gun safety procedures. What's next?  Visit your health care provider once a year for an annual wellness visit.  Ask your health care provider how often you should have your eyes and teeth checked.  Stay up to date on all vaccines. This information is not intended to replace advice given to you by your health care provider. Make sure you discuss any questions you have with your health care provider. Document Revised: 01/23/2020 Document Reviewed: 01/26/2018 Elsevier Patient Education  2021 Elsevier Inc.  

## 2020-05-13 NOTE — Progress Notes (Signed)
Subjective:     Michelle [name] is a 65 y.o. female and is here for a comprehensive physical exam. The patient reports no new problems-- getting over bronchitis---  2 doses of omnicef left . Pt was also dx with breast cancer last week.   She has an app with the surgeon 4/7.    Social History   Socioeconomic History  . Marital status: Married    Spouse name: Not on file  . Number of children: Not on file  . Years of education: Not on file  . Highest education level: Not on file  Occupational History  . Occupation: Therapist, sports  Tobacco Use  . Smoking status: Never Smoker  . Smokeless tobacco: Never Used  Vaping Use  . Vaping Use: Never used  Substance and Sexual Activity  . Alcohol use: No  . Drug use: No  . Sexual activity: Yes  Other Topics Concern  . Not on file  Social History Narrative   Exercise- Walks about an hour a day   Right handed   Lives husband one story   Social Determinants of Health   Financial Resource Strain: Not on file  Food Insecurity: Not on file  Transportation Needs: Not on file  Physical Activity: Not on file  Stress: Not on file  Social Connections: Not on file  Intimate Partner Violence: Not on file   Health Maintenance  Topic Date Due  . PNA vac Low Risk Adult (1 of 2 - PCV13) Never done  . COVID-19 Vaccine (3 - Booster for Pfizer series) 11/01/2019  . FOOT EXAM  04/23/2020  . HEMOGLOBIN A1C  05/05/2020  . URINE MICROALBUMIN  11/05/2020  . OPHTHALMOLOGY EXAM  04/02/2021  . MAMMOGRAM  04/14/2021  . DEXA SCAN  04/14/2022  . COLONOSCOPY (Pts 45-32yrs Insurance coverage will need to be confirmed)  11/12/2026  . Hepatitis C Screening  Completed  . HPV VACCINES  Aged Out     The following portions of the patient's history were reviewed and updated as appropriate:  She  has a past medical history of Allergy, Asthma, Bronchitis, COPD (chronic obstructive pulmonary disease) ([place]), Diabetes mellitus without complication ([place]), Hyperlipidemia,  Hypertension, Kidney stones, Obesity, and Thyroid disease. She does not have any pertinent problems on file. She  has a past surgical history that includes Tonsillectomy and adenoidectomy (childhood); Tubal ligation; and Vaginal hysterectomy (2011). Her family history includes Colon polyps in her brother; Heart disease in her father and mother; Hyperlipidemia in her mother; Hypertension in her mother; Prostate cancer in her brother; Stroke in her mother. She  reports that she has never smoked. She has never used smokeless tobacco. She reports that she does not drink alcohol and does not use drugs. She has a current medication list which includes the following prescription(s): albuterol, alpha-lipoic acid, AMBULATORY NON FORMULARY MEDICATION, amlodipine, budesonide-formoterol, calcium-magnesium, cefdinir, cholecalciferol, cyclobenzaprine, fluticasone, hydrochlorothiazide, metformin, methylcobalamin, multiple vitamin, nature-throid, NON FORMULARY, probiotic, and simvastatin, and the following Facility-Administered Medications: sodium chloride. Current Outpatient Medications on File Prior to Visit  Medication Sig Dispense Refill  . albuterol (VENTOLIN HFA) 108 (90 Base) MCG/ACT inhaler INhale 1 puffs into lungs every 8 hours prn wheezing or SOB 1 each 1  . ALPHA LIPOIC ACID PO Take 1-2 capsules by mouth 2 (two) times daily.    . AMBULATORY NON FORMULARY MEDICATION Medication Name: Biest/Progesterone/Testosterone/DHEA 2.5/250/0.5/2mg     . amLODipine (NORVASC) 5 MG tablet TAKE 1 TABLET (5 MG TOTAL) BY MOUTH DAILY. 90 tablet 1  . budesonide-formoterol (  SYMBICORT) 80-4.5 MCG/ACT inhaler Inhale 2 puffs into the lungs in the morning and at bedtime. 10.2 g 5  . Calcium-Magnesium (CAL-MAG PO) Take 1 tablet by mouth daily.    . cefdinir (OMNICEF) 300 MG capsule Take 1 capsule (300 mg total) by mouth 2 (two) times daily. 10 capsule 0  . Cholecalciferol (VITAMIN D3 PO) Take 5,000 Units by mouth daily.    .  cyclobenzaprine (FLEXERIL) 10 MG tablet Take 1 tablet (10 mg total) by mouth 3 (three) times daily as needed for muscle spasms. 30 tablet 0  . fluticasone (FLONASE) 50 MCG/ACT nasal spray Place 1 spray into both nostrils daily.    . hydrochlorothiazide (HYDRODIURIL) 25 MG tablet Take 1 tablet (25 mg total) by mouth daily. 90 tablet 1  . metFORMIN (GLUCOPHAGE-XR) 500 MG 24 hr tablet 1 po bid 180 tablet 1  . Methylcobalamin 50000 MCG SOLR Inject 5,000 mcg as directed 2 (two) times a week.    . MULTIPLE VITAMIN PO Take 1 tablet by mouth daily.    Marland Kitchen NATURE-THROID 32.5 MG tablet Take 1 tablet by mouth daily.   3  . NON FORMULARY Take 1 tablet by mouth 2 (two) times daily. Heal-n-Soothe Enzymes    . PROBIOTIC CAPS Take 1 capsule by mouth daily.    . simvastatin (ZOCOR) 20 MG tablet Take 1 tablet (20 mg total) by mouth daily at 6 PM. 90 tablet 1   Current Facility-Administered Medications on File Prior to Visit  Medication Dose Route Frequency Provider Last Rate Last Admin  . 0.9 %  sodium chloride infusion  500 mL Intravenous Continuous Nandigam, Kavitha V, MD       She is allergic to influenza vaccine live, shrimp [shellfish allergy], tdap [tetanus-diphth-acell pertussis], demerol [meperidine], and latex..  Review of Systems Review of Systems  Constitutional: Negative for activity change, appetite change and fatigue.  HENT: Negative for hearing loss, congestion, tinnitus and ear discharge.  dentist q65m Eyes: Negative for visual disturbance (see optho q1y -- vision corrected to 20/20 with glasses).  Respiratory: Negative for cough, chest tightness and shortness of breath.   Cardiovascular: Negative for chest pain, palpitations and leg swelling.  Gastrointestinal: Negative for abdominal pain, diarrhea, constipation and abdominal distention.  Genitourinary: Negative for urgency, frequency, decreased urine volume and difficulty urinating.  Musculoskeletal: Negative for back pain, arthralgias and  gait problem.  Skin: Negative for color change, pallor and rash.  Neurological: Negative for dizziness, light-headedness, numbness and headaches.  Hematological: Negative for adenopathy. Does not bruise/bleed easily.  Psychiatric/Behavioral: Negative for suicidal ideas, confusion, sleep disturbance, self-injury, dysphoric mood, decreased concentration and agitation.       Objective:    BP (!) 150/66 (BP Location: Right Arm, Patient Position: Sitting, Cuff Size: Normal)   Pulse 91   Temp 98.1 F (36.7 C) (Oral)   Resp 18   Ht 5\' 3"  (1.6 m)   Wt 165 lb 12.8 oz (75.2 kg)   SpO2 98%   BMI 29.37 kg/m  General appearance: alert, cooperative, appears stated age and no distress Head: Normocephalic, without obvious abnormality, atraumatic Eyes: conjunctivae/corneas clear. PERRL, EOM's intact. Fundi benign. Ears: normal TM's and external ear canals both ears Neck: no adenopathy, no carotid bruit, no JVD, supple, symmetrical, trachea midline and thyroid not enlarged, symmetric, no tenderness/mass/nodules Back: symmetric, no curvature. ROM normal. No CVA tenderness. Lungs: clear to auscultation bilaterally Breasts: gyn Heart: regular rate and rhythm, S1, S2 normal, no murmur, click, rub or gallop Abdomen: soft, non-tender; bowel  sounds normal; no masses,  no organomegaly Pelvic: deferred--gyn Extremities: extremities normal, atraumatic, no cyanosis or edema Pulses: 2+ and symmetric Skin: Skin color, texture, turgor normal. No rashes or lesions Lymph nodes: Cervical, supraclavicular, and axillary nodes normal. Neurologic: Alert and oriented X 3, normal strength and tone. Normal symmetric reflexes. Normal coordination and gait    Assessment:    Healthy female exam.      Plan:    ghm utd Check labs See After Visit Summary for Counseling Recommendations    1. Hyperlipidemia, unspecified hyperlipidemia type Encouraged heart healthy diet, increase exercise, avoid trans fats, consider  a krill oil cap daily - Lipid panel - Hemoglobin A1c - CBC with Differential/Platelet - Comprehensive metabolic panel  2. Uncontrolled type 2 diabetes mellitus with hyperglycemia ([place]) hgba1c to be checked , minimize simple carbs. Increase exercise as tolerated. Continue current meds  - Lipid panel - Hemoglobin A1c - CBC with Differential/Platelet - Comprehensive metabolic panel  3. Preventative health care ghm utd Check labs  - Lipid panel - Hemoglobin A1c - CBC with Differential/Platelet - Comprehensive metabolic panel    4. Vitamin D deficiency  - Vitamin D (25 hydroxy)

## 2020-05-13 NOTE — Telephone Encounter (Signed)
CRITICAL VALUE STICKER  CRITICAL VALUE: Vitamin D 111.43  RECEIVER (on-site recipient of call): [place] NOTIFIED: 05/13/2020 @ 2:30 pm  MESSENGER (representative from lab): [place]   MD NOTIFIED: Roma Schanz, DO  TIME OF NOTIFICATION: 2:31pm  RESPONSE:

## 2020-05-14 ENCOUNTER — Encounter: Payer: Self-pay | Admitting: Adult Health

## 2020-05-14 DIAGNOSIS — C50412 Malignant neoplasm of upper-outer quadrant of left female breast: Secondary | ICD-10-CM | POA: Insufficient documentation

## 2020-05-14 DIAGNOSIS — Z170 Estrogen receptor positive status [ER+]: Secondary | ICD-10-CM | POA: Insufficient documentation

## 2020-05-22 DIAGNOSIS — C50412 Malignant neoplasm of upper-outer quadrant of left female breast: Secondary | ICD-10-CM | POA: Diagnosis not present

## 2020-05-23 ENCOUNTER — Other Ambulatory Visit: Payer: Self-pay | Admitting: General Surgery

## 2020-05-23 ENCOUNTER — Encounter: Payer: Self-pay | Admitting: *Deleted

## 2020-05-23 DIAGNOSIS — Z170 Estrogen receptor positive status [ER+]: Secondary | ICD-10-CM

## 2020-05-23 DIAGNOSIS — C50412 Malignant neoplasm of upper-outer quadrant of left female breast: Secondary | ICD-10-CM

## 2020-05-24 MED FILL — Budesonide-Formoterol Fumarate Dihyd Aerosol 80-4.5 MCG/ACT: RESPIRATORY_TRACT | 30 days supply | Qty: 10.2 | Fill #0 | Status: AC

## 2020-05-26 ENCOUNTER — Other Ambulatory Visit (HOSPITAL_BASED_OUTPATIENT_CLINIC_OR_DEPARTMENT_OTHER): Payer: Self-pay

## 2020-05-26 ENCOUNTER — Inpatient Hospital Stay: Payer: PPO | Attending: Hematology & Oncology | Admitting: *Deleted

## 2020-05-26 ENCOUNTER — Other Ambulatory Visit: Payer: Self-pay

## 2020-05-26 ENCOUNTER — Other Ambulatory Visit: Payer: Self-pay | Admitting: General Surgery

## 2020-05-26 DIAGNOSIS — C50412 Malignant neoplasm of upper-outer quadrant of left female breast: Secondary | ICD-10-CM

## 2020-05-26 DIAGNOSIS — Z170 Estrogen receptor positive status [ER+]: Secondary | ICD-10-CM

## 2020-05-26 NOTE — Progress Notes (Addendum)
This evaluation was completed over the phone. Patient was not in the office.   Reached out to Rod Can to introduce myself as the office RN Navigator and explain our new patient process. Reviewed the reason for their referral and scheduled their new patient appointment along with labs. Reviewed with patient any concerns they may have or any possible barriers to attending their appointment.   Patient had initial consultation with Dr Donne Hazel and a lumpectomy is planned. This is scheduled for 06/11/2020. We will see her about 3 weeks after surgery on May 16th.   Patient understands why Dr Marin Olp doesn't need to have consultation with patient until after lumpectomy. I sent MyChart message with my contact information for any questions or concerns in the interim. Patient is aware of follow up appointment with this office.   Oncology Nurse Navigator Documentation  Oncology Nurse Navigator Flowsheets 05/26/2020  Abnormal Finding Date 04/16/2020  Confirmed Diagnosis Date 05/05/2020  Diagnosis Status Confirmed Diagnosis Complete  Planned Course of Treatment Surgery  Phase of Treatment Surgery  Expected Surgery Date 06/11/2020  Navigator Follow Up Date: 06/30/2020  Navigator Follow Up Reason: New Patient Appointment  Navigator Location [facility]  Referral Date to RadOnc/MedOnc 05/23/2020  Navigator Encounter Type Introductory Phone Call  Patient Visit Type MedOnc  Treatment Phase Pre-Tx/Tx Discussion  Barriers/Navigation Needs Coordination of Care;Education  Education Newly Diagnosed Cancer Education  Interventions Coordination of Care;Education  Acuity Level 2-Minimal Needs (1-2 Barriers Identified)  Coordination of Care Appts  Education Method Verbal  Support Groups/Services Friends and Family  Time Spent with Patient 1

## 2020-06-02 NOTE — Pre-Procedure Instructions (Addendum)
Surgical Instructions    Your procedure is scheduled on Wednesday, April 27th at 9:00 A.M..  Report to [facility] Main Entrance "A" at 7:00 A.M., then check in with the Admitting office.  Call this number if you have problems the morning of surgery:  [phone]   If you have any questions prior to your surgery date call [phone]: Open Monday-Friday 8am-4pm    Remember:  Do not eat after midnight the night before your surgery  You may drink clear liquids until 6:00 a.m. the morning of your surgery.   Clear liquids allowed are: Water, Non-Citrus Juices (without pulp), Carbonated Beverages, Clear Tea, Black Coffee Only, and Gatorade. (Please choose diet or sugar-free options)  Please complete the 10 oz bottle of water that was given to you by 6:00 A.M. the morning of surgery.     Take these medicines the morning of surgery with A SIP OF WATER  amLODipine (NORVASC) cefdinir (OMNICEF) fluticasone (FLONASE)  predniSONE (DELTASONE) budesonide-formoterol (SYMBICORT) valACYclovir (VALTREX)  cyclobenzaprine (FLEXERIL)-as needed for muscle spasms Albuterol inhaler-as needed; please bring with you on the day of surgery.   As of today, STOP taking any Aspirin (unless otherwise instructed by your surgeon) Aleve, Naproxen, Ibuprofen, Motrin, Advil, Goody's, BC's, all herbal medications, fish oil, and all vitamins.   WHAT DO I DO ABOUT MY DIABETES MEDICATION?   Marland Kitchen Do not take metFORMIN (GLUCOPHAGE-XR) the morning of surgery.   HOW TO MANAGE YOUR DIABETES BEFORE AND AFTER SURGERY  Why is it important to control my blood sugar before and after surgery? . Improving blood sugar levels before and after surgery helps healing and can limit problems. . A way of improving blood sugar control is eating a healthy diet by: o  Eating less sugar and carbohydrates o  Increasing activity/exercise o  Talking with your doctor about reaching your blood sugar goals . High blood sugars (greater than  180 mg/dL) can raise your risk of infections and slow your recovery, so you will need to focus on controlling your diabetes during the weeks before surgery. . Make sure that the doctor who takes care of your diabetes knows about your planned surgery including the date and location.  How do I manage my blood sugar before surgery? . Check your blood sugar at least 4 times a day, starting 2 days before surgery, to make sure that the level is not too high or low. . Check your blood sugar the morning of your surgery when you wake up and every 2 hours until you get to the Short Stay unit. o If your blood sugar is less than 70 mg/dL, you will need to treat for low blood sugar: - Do not take insulin. - Treat a low blood sugar (less than 70 mg/dL) with  cup of clear juice (cranberry or apple), 4 glucose tablets, OR glucose gel. - Recheck blood sugar in 15 minutes after treatment (to make sure it is greater than 70 mg/dL). If your blood sugar is not greater than 70 mg/dL on recheck, call [phone] for further instructions. . Report your blood sugar to the short stay nurse when you get to Short Stay.  . If you are admitted to the hospital after surgery: o Your blood sugar will be checked by the staff and you will probably be given insulin after surgery (instead of oral diabetes medicines) to make sure you have good blood sugar levels. o The goal for blood sugar control after surgery is 80-180 mg/dL.  Do NOT Smoke (Tobacco/Vaping) or drink Alcohol 24 hours prior to your procedure.  If you use a CPAP at night, you may bring all equipment for your overnight stay.   Contacts, glasses, piercing's, hearing aid's, dentures or partials may not be worn into surgery, please bring cases for these belongings.    For patients admitted to the hospital, discharge time will be determined by your treatment team.   Patients discharged the day of surgery will not be allowed to drive home, and  someone needs to stay with them for 24 hours.    Special instructions:   [facility]- Preparing For Surgery  Before surgery, you can play an important role. Because skin is not sterile, your skin needs to be as free of germs as possible. You can reduce the number of germs on your skin by washing with CHG (chlorahexidine gluconate) Soap before surgery.  CHG is an antiseptic cleaner which kills germs and bonds with the skin to continue killing germs even after washing.    Oral Hygiene is also important to reduce your risk of infection.  Remember - BRUSH YOUR TEETH THE MORNING OF SURGERY WITH YOUR REGULAR TOOTHPASTE  Please do not use if you have an allergy to CHG or antibacterial soaps. If your skin becomes reddened/irritated stop using the CHG.  Do not shave (including legs and underarms) for at least 48 hours prior to first CHG shower. It is OK to shave your face.  Please follow these instructions carefully.   1. Shower the NIGHT BEFORE SURGERY and the MORNING OF SURGERY  2. If you chose to wash your hair, wash your hair first as usual with your normal shampoo.  3. After you shampoo, rinse your hair and body thoroughly to remove the shampoo.  4. Use CHG Soap as you would any other liquid soap. You can apply CHG directly to the skin and wash gently with a scrungie or a clean washcloth.   5. Apply the CHG Soap to your body ONLY FROM THE NECK DOWN.  Do not use on open wounds or open sores. Avoid contact with your eyes, ears, mouth and genitals (private parts). Wash Face and genitals (private parts)  with your normal soap.   6. Wash thoroughly, paying special attention to the area where your surgery will be performed.  7. Thoroughly rinse your body with warm water from the neck down.  8. DO NOT shower/wash with your normal soap after using and rinsing off the CHG Soap.  9. Pat yourself dry with a CLEAN TOWEL.  10. Wear CLEAN PAJAMAS to bed the night before surgery  11. Place CLEAN  SHEETS on your bed the night before your surgery  12. DO NOT SLEEP WITH PETS.   Day of Surgery: Shower with CHG soap. Do not wear jewelry, make up, or nail polish Do not wear lotions, powders, perfumes, or deodorant. Men may shave face and neck. Do not bring valuables to the hospital. [facility] is not responsible for any belongings or valuables. Wear Clean/Comfortable clothing the morning of surgery Remember to brush your teeth WITH YOUR REGULAR TOOTHPASTE.   Please read over the following fact sheets that you were given.

## 2020-06-03 ENCOUNTER — Ambulatory Visit: Payer: PPO | Attending: General Surgery | Admitting: Physical Therapy

## 2020-06-03 ENCOUNTER — Encounter (HOSPITAL_COMMUNITY)
Admission: RE | Admit: 2020-06-03 | Discharge: 2020-06-03 | Disposition: A | Discharge status: Home / Self Care | Payer: PPO | Source: Ambulatory Visit | Attending: General Surgery | Admitting: General Surgery

## 2020-06-03 ENCOUNTER — Other Ambulatory Visit: Payer: Self-pay

## 2020-06-03 ENCOUNTER — Encounter (HOSPITAL_COMMUNITY): Payer: Self-pay

## 2020-06-03 DIAGNOSIS — Z01812 Encounter for preprocedural laboratory examination: Secondary | ICD-10-CM | POA: Insufficient documentation

## 2020-06-03 DIAGNOSIS — R293 Abnormal posture: Secondary | ICD-10-CM | POA: Insufficient documentation

## 2020-06-03 DIAGNOSIS — C50412 Malignant neoplasm of upper-outer quadrant of left female breast: Secondary | ICD-10-CM | POA: Diagnosis not present

## 2020-06-03 HISTORY — DX: Personal history of urinary calculi: Z87.442

## 2020-06-03 HISTORY — DX: Pneumonia, unspecified organism: J18.9

## 2020-06-03 HISTORY — DX: Malignant (primary) neoplasm, unspecified: C80.1

## 2020-06-03 HISTORY — DX: Hypothyroidism, unspecified: E03.9

## 2020-06-03 HISTORY — DX: Myoneural disorder, unspecified: G70.9

## 2020-06-03 HISTORY — DX: Personal history of other diseases of the digestive system: Z87.19

## 2020-06-03 LAB — BASIC METABOLIC PANEL
Anion gap: 9 (ref 5–15)
BUN: 10 mg/dL (ref 8–23)
CO2: 29 mmol/L (ref 22–32)
Calcium: 10 mg/dL (ref 8.9–10.3)
Chloride: 93 mmol/L — ABNORMAL LOW (ref 98–111)
Creatinine, Ser: 0.75 mg/dL (ref 0.44–1.00)
GFR, Estimated: >60 mL/min (ref >60)
Glucose, Bld: 107 mg/dL — ABNORMAL HIGH (ref 70–99)
Potassium: 4.1 mmol/L (ref 3.5–5.1)
Sodium: 131 mmol/L — ABNORMAL LOW (ref 135–145)

## 2020-06-03 LAB — CBC
HCT: 41.9 % (ref 36.0–46.0)
Hemoglobin: 13.8 g/dL (ref 12.0–15.0)
MCH: 30.2 pg (ref 26.0–34.0)
MCHC: 32.9 g/dL (ref 30.0–36.0)
MCV: 91.7 fL (ref 80.0–100.0)
Platelets: 375 10*3/uL (ref 150–400)
RBC: 4.57 MIL/uL (ref 3.87–5.11)
RDW: 13.3 % (ref 11.5–15.5)
WBC: 6 10*3/uL (ref 4.0–10.5)
nRBC: 0 % (ref 0.0–0.2)

## 2020-06-03 LAB — GLUCOSE, CAPILLARY: Glucose-Capillary: 114 mg/dL — ABNORMAL HIGH (ref 70–99)

## 2020-06-03 NOTE — Progress Notes (Signed)
PCP - Dr. Lyndal Pulley Cardiologist - denies  PPM/ICD - n/a Device Orders - n/a Rep Notified - n/a  Chest x-ray - n/a EKG - 06/03/20 Stress Test - denies ECHO - denies Cardiac Cath - denies  Sleep Study - denies    Fasting Blood Sugar - 06/03/20   114 A1 C- 05/13/20   6.0   Blood Thinner Instructions:n/a Aspirin Instructions: n/a  ERAS Protcol - Yes. Instructed to complete 10 oz bottle of water by 6 am day of surgery PRE-SURGERY Ensure or G2- Water  COVID TEST- Scheduled for 06/09/20   Anesthesia review: Yes-EKG review  Patient denies shortness of breath, fever, cough and chest pain at PAT appointment   All instructions explained to the patient, with a verbal understanding of the material. Patient agrees to go over the instructions while at home for a better understanding. Patient also instructed to self quarantine after being tested for COVID-19. The opportunity to ask questions was provided.
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
## 2020-06-03 NOTE — Patient Instructions (Addendum)
Physical Therapy Information for After Breast Cancer Surgery/Treatment:   Lymphedema is a swelling condition that you may be at risk for in your arm if you have lymph nodes removed from the armpit area.  After a sentinel node biopsy, the risk is approximately 5-9% and is higher after an axillary node dissection.  There is treatment available for this condition and it is not life-threatening.  Contact your physician or physical therapist with concerns.  You may begin the 4 shoulder/posture exercises (see additional sheet) when permitted by your physician (typically a week after surgery).  If you have drains, you may need to wait until those are removed before beginning range of motion exercises.  A general recommendation is to not lift your arms above shoulder height until drains are removed.  These exercises should be done to your tolerance and gently.  This is not a "no pain/no gain" type of recovery so listen to your body and stretch into the range of motion that you can tolerate, stopping if you have pain.  If you are having immediate reconstruction, ask your plastic surgeon about doing exercises as he or she may want you to wait.  We encourage you to attend the free one time ABC (After Breast Cancer) class offered by  Outpatient Cancer [facility].  You will learn information related to lymphedema risk, prevention and treatment and additional exercises to regain mobility following surgery.  You can call [phone] for more information.  This is offered the 1st and 3rd Monday of each month.  You only attend the class one time.  While undergoing any medical procedure or treatment, try to avoid blood pressure being taken or needle sticks from occurring on the arm on the side of cancer.   This recommendation begins after surgery and continues for the rest of your life.  This may help reduce your risk of getting lymphedema (swelling in your arm).  An excellent resource for those seeking information  on lymphedema is the National Lymphedema Network's web site. It can be accessed at www.lymphnet.org  If you notice swelling in your hand, arm or breast at any time following surgery (even if it is many years from now), please contact your doctor or physical therapist to discuss this.  Lymphedema can be treated at any time but it is easier for you if it is treated early on.  If you feel like your shoulder motion is not returning to normal in a reasonable amount of time, please contact your surgeon or physical therapist.  Michelle [name], PT, CLT [phone]; [address] [place], [place] [address] ABC CLASS After Breast Cancer Class  After Breast Cancer Class is a specially designed exercise class to assist you in a safe recover after having breast cancer surgery.  In this class you will learn how to get back to full function whether your drains were just removed or if you had surgery a month ago.  This one-time class is held the 1st and 3rd Monday of every month from 11:00 a.m. until 12:00 noon at the [facility] located at [address] [place], [place] [address]  This class is FREE and space is limited. For more information or to register for the next available class, call [phone].  Class Goals   Understand specific stretches to improve the flexibility of you chest and shoulder.  Learn ways to safely strengthen your upper body and improve your posture.  Understand the warning signs of infection and why   you may be at risk for an arm infection.  Learn about Lymphedema and prevention.  ** You do not attend this class until after surgery.  Drains must be removed to participate  Patient was instructed today in a home exercise program today for post op shoulder range of motion. These included active assist shoulder flexion in sitting, scapular retraction, wall walking with shoulder abduction, and hands behind head external rotation.  She was  encouraged to do these twice a day, holding 3 seconds and repeating 5 times when permitted by her physician.             First of all, check with your insurance company to see if provider is in [place] (for wigs and compression sleeves / gloves/gauntlets )  [place], [place] [address] [phone]  Will file some insurances --- call for appointment   Second to [facility] (for mastectomy prosthetics and garments) [place], [place] [address] [phone] Will file some insurances --- call for appointment  [facility]  [address]  [place], [place] [address] [phone] Lower extremity garments  [facility] [place], [place]  [address] [place] and Prosthetics (for compression garments, especilly for lower extremities) [address], [place], [place]  [address] [phone] Call for appointment    Jerrol Banana ,certified fitter [place]  [phone]  [employer] (for mastectomy supplies and garments) [place], [place] [address] [phone]  Other Resources: National Lymphedema Network:  www.lymphnet.org www.Klosetraining.com for patient articles and self manual lymph drainage information www.lymphedemablog.com has informative articles.  [employer].es.com www.lymphedemaproducts.com Www.brightlifedirect.com

## 2020-06-03 NOTE — Progress Notes (Signed)
Anesthesia Chart Review:  Case: [id number] Date/Time: 06/11/20 0845   Procedure: LEFT BREAST LUMPECTOMY WITH RADIOACTIVE SEED AND LEFT AXILLARY SENTINEL LYMPH NODE BIOPSY (Left Breast) - 60 MINUTES   Anesthesia type: General   Pre-op diagnosis: LEFT BREAST CANCER   Location: [facility] OR ROOM 01 / [facility] OR   Surgeons: Rolm Bookbinder, MD      DISCUSSION: Patient is a 65 year old female scheduled for the above procedure.   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  Preoperative COVID-19 test is scheduled for 06/09/20. Anesthesia team to evaluate on the day of surgery. Has [facility] appointment on 06/10/20.   VS: BP (!) 169/72   Pulse 95   Temp 36.5 C (Oral)   Resp 18   Ht 5' 3.5" (1.613 m)   Wt 74.2 kg   SpO2 100%   BMI 28.51 kg/m   PROVIDERS: [name], DO is PCP  Burney Gauze, MD is HEM-ONC. To get established after surgery.   LABS: Labs reviewed: Acceptable for surgery. A1c 6.0% on 05/13/20.  (all labs ordered are listed, but only abnormal results are displayed)  Labs Reviewed  GLUCOSE, CAPILLARY - Abnormal; Notable for the following components:      Result Value   Glucose-Capillary 114 (*)    All other components within normal limits  BASIC METABOLIC PANEL - Abnormal; Notable for the following components:   Sodium 131 (*)    Chloride 93 (*)    Glucose, Bld 107 (*)    All other components within normal limits  CBC    - PFT's 11/19/2014: "FEV1 2.75  (83 % ) ratio 3  p 3 % improvement from saba with DLCO  97 % corrects to 96 % for alv volume  And ERV 20%"    EKG: 06/03/20: Normal sinus rhythm Incomplete right bundle branch block Borderline ECG Confirmed by Loralie Champagne ([id number]) on 06/03/2020 11:21:32 AM   CV: Denied prior echo, stress, cardiac cath  Past Medical  History:  Diagnosis Date  . Allergy   . Asthma   . Bronchitis   . Cancer ([place])   . COPD (chronic obstructive pulmonary disease) (HCC)    mild  . Diabetes mellitus without complication ([place])   . History of hiatal hernia   . History of kidney stones   . Hyperlipidemia   . Hypertension    stress induced/ work makes it worse.  Marland Kitchen Hypothyroidism   . Neuromuscular disorder (HCC)    Neuropathy  . Obesity   . Pneumonia   . Thyroid disease     Past Surgical History:  Procedure Laterality Date  . TONSILLECTOMY AND ADENOIDECTOMY  childhood  . TUBAL LIGATION    . VAGINAL HYSTERECTOMY  2011    MEDICATIONS: . co-enzyme Q-10 50 MG capsule  . acetaminophen (TYLENOL) 500 MG tablet  . albuterol (VENTOLIN HFA) 108 (90 Base) MCG/ACT inhaler  . Alpha-Lipoic Acid 600 MG TABS  . amLODipine (NORVASC) 5 MG tablet  . Ascorbic Acid (VITAMIN C) 1000 MG tablet  . budesonide-formoterol (SYMBICORT) 80-4.5 MCG/ACT inhaler  . Calcium-Magnesium (CAL-MAG PO)  . cefdinir (OMNICEF) 300 MG capsule  . Cholecalciferol (DIALYVITE VITAMIN D 5000) 125 MCG (5000 UT) capsule  . CHROMIUM PO  . CINNAMON PO  . cyclobenzaprine (FLEXERIL) 10 MG tablet  . Digestive Enzyme CAPS  . fluticasone (FLONASE)  50 MCG/ACT nasal spray  . GARLIC PO  . hydrochlorothiazide (HYDRODIURIL) 25 MG tablet  . Levomefolic Acid (5-MTHF PO)  . loratadine (CLARITIN) 10 MG tablet  . Melatonin 10 MG CAPS  . Menaquinone-7 (VITAMIN K2 PO)  . metFORMIN (GLUCOPHAGE-XR) 500 MG 24 hr tablet  . Methylcobalamin 50000 MCG SOLR  . MULTIPLE VITAMIN PO  . NALTREXONE HCL PO  . nystatin (MYCOSTATIN) 500000 units TABS tablet  . Omega-3 Fatty Acids (THE VERY FINEST FISH OIL) LIQD  . POTASSIUM IODIDE PO  . predniSONE (DELTASONE) 20 MG tablet  . Probiotic Product (PROBIOTIC PO)  . QUERCETIN PO  . simvastatin (ZOCOR) 20 MG tablet  . thyroid (ARMOUR) 90 MG tablet  . TURMERIC PO  . valACYclovir (VALTREX) 1000 MG tablet  . VITAMIN A PO  . VITAMIN E  PO  . Zinc 30 MG CAPS   . 0.9 %  sodium chloride infusion    Myra Gianotti, PA-C Surgical Short Stay/Anesthesiology [facility] Phone [phone] [facility] Phone [phone] 06/03/2020 4:40 PM

## 2020-06-03 NOTE — Progress Notes (Signed)
During PAT visit patient stated she has a sensitivity to CHG antiseptic solution used to shower prior to surgery. Patient stated it causes a slight rash and she prefers not to use it. Patient instructed to follow instructions and use dial soap. Patient verbalized understanding,

## 2020-06-03 NOTE — Anesthesia Preprocedure Evaluation (Addendum)
Anesthesia Evaluation  Patient identified by MRN, date of birth, ID band Patient awake    Reviewed: Allergy & Precautions, NPO status , Patient's Chart, lab work & pertinent test results  Airway Mallampati: II  TM Distance: >3 FB Neck ROM: Full    Dental no notable dental hx. (+) Teeth Intact, Dental Advisory Given   Pulmonary asthma , pneumonia (march 2022), resolved, COPD (chronic bronchitis, never smoked),  COPD inhaler,    Pulmonary exam normal breath sounds clear to auscultation       Cardiovascular hypertension, Pt. on medications Normal cardiovascular exam Rhythm:Regular Rate:Normal  BP 198/78 in preop- stress induced, not normally this high per pt   Neuro/Psych negative neurological ROS  negative psych ROS   GI/Hepatic Neg liver ROS, hiatal hernia,   Endo/Other  diabetes, Well Controlled, Type 2, Oral Hypoglycemic AgentsHypothyroidism   Renal/GU negative Renal ROS  negative genitourinary   Musculoskeletal negative musculoskeletal ROS (+)   Abdominal   Peds  Hematology negative hematology ROS (+) hct 41.9   Anesthesia Other Findings L breast ca   Reproductive/Obstetrics negative OB ROS                           Anesthesia Physical Anesthesia Plan  ASA: II  Anesthesia Plan: General and Regional   Post-op Pain Management: GA combined w/ Regional for post-op pain   Induction: Intravenous  PONV Risk Score and Plan: Ondansetron, Dexamethasone, Midazolam and Treatment may vary due to age or medical condition  Airway Management Planned: LMA  Additional Equipment: None  Intra-op Plan:   Post-operative Plan: Extubation in OR  Informed Consent: I have reviewed the patients History and Physical, chart, labs and discussed the procedure including the risks, benefits and alternatives for the proposed anesthesia with the patient or authorized representative who has indicated his/her  understanding and acceptance.     Dental advisory given  Plan Discussed with: CRNA  Anesthesia Plan Comments:       Anesthesia Quick Evaluation

## 2020-06-04 NOTE — Progress Notes (Signed)
New Breast Cancer Diagnosis: Left Breast UOQ  Did patient present with symptoms (if so, please note symptoms) or screening mammography?:Screening Mass    Location and Extent of disease :left breast. Located at 12 o'clock position, measured  5 mm in greatest dimension.  There was also a 1.3 cm lesion in the 3 o'clock position.  Adenopathy no.  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 Donne Hazel -Referred to PT for evaluation and follow-up. -Referred to Oncology for evaluation and follow-up. -Left Breast Lumpectomy with radioactive seed and SLN biopsy 06/11/2020  Medical oncologist, treatment if any:   Dr. Marin Olp 06/30/2020   Family History of Breast/Ovarian/Prostate Cancer: Brother had prostate cancer.  Lymphedema issues, if any:  No  Pain issues, if any:  No  SAFETY ISSUES: Prior radiation? No Pacemaker/ICD? No Possible current pregnancy? Hysterectomy Is the patient on methotrexate? No  Current Complaints / other details:

## 2020-06-05 ENCOUNTER — Encounter: Payer: Self-pay | Admitting: Radiation Oncology

## 2020-06-05 ENCOUNTER — Other Ambulatory Visit: Payer: Self-pay

## 2020-06-05 ENCOUNTER — Ambulatory Visit
Admission: RE | Admit: 2020-06-05 | Discharge: 2020-06-05 | Disposition: A | Discharge status: Home / Self Care | Payer: PPO | Source: Ambulatory Visit | Attending: Radiation Oncology | Admitting: Radiation Oncology

## 2020-06-05 VITALS — BP 158/74 | HR 93 | Temp 97.7°F | Resp 18 | Ht 63.5 in | Wt 164.4 lb

## 2020-06-05 DIAGNOSIS — Z170 Estrogen receptor positive status [ER+]: Secondary | ICD-10-CM | POA: Diagnosis not present

## 2020-06-05 DIAGNOSIS — G629 Polyneuropathy, unspecified: Secondary | ICD-10-CM | POA: Diagnosis not present

## 2020-06-05 DIAGNOSIS — Z79899 Other long term (current) drug therapy: Secondary | ICD-10-CM | POA: Diagnosis not present

## 2020-06-05 DIAGNOSIS — E079 Disorder of thyroid, unspecified: Secondary | ICD-10-CM | POA: Insufficient documentation

## 2020-06-05 DIAGNOSIS — E039 Hypothyroidism, unspecified: Secondary | ICD-10-CM | POA: Insufficient documentation

## 2020-06-05 DIAGNOSIS — C50412 Malignant neoplasm of upper-outer quadrant of left female breast: Secondary | ICD-10-CM

## 2020-06-05 DIAGNOSIS — E119 Type 2 diabetes mellitus without complications: Secondary | ICD-10-CM | POA: Diagnosis not present

## 2020-06-05 DIAGNOSIS — I10 Essential (primary) hypertension: Secondary | ICD-10-CM | POA: Diagnosis not present

## 2020-06-05 DIAGNOSIS — E785 Hyperlipidemia, unspecified: Secondary | ICD-10-CM | POA: Diagnosis not present

## 2020-06-05 DIAGNOSIS — J45909 Unspecified asthma, uncomplicated: Secondary | ICD-10-CM | POA: Diagnosis not present

## 2020-06-05 DIAGNOSIS — Z7984 Long term (current) use of oral hypoglycemic drugs: Secondary | ICD-10-CM | POA: Insufficient documentation

## 2020-06-05 DIAGNOSIS — J449 Chronic obstructive pulmonary disease, unspecified: Secondary | ICD-10-CM | POA: Diagnosis not present

## 2020-06-05 DIAGNOSIS — K449 Diaphragmatic hernia without obstruction or gangrene: Secondary | ICD-10-CM | POA: Diagnosis not present

## 2020-06-05 HISTORY — DX: Infectious mononucleosis, unspecified without complication: B27.90

## 2020-06-05 NOTE — Progress Notes (Signed)
Radiation Oncology         [phone] ________________________________  Name: Michelle [name]        MRN: [id number]  Date of Service: 06/05/2020 DOB: February 18, 1955  MB:WGYKZ Chase, Alferd Apa, DO  Rolm Bookbinder, MD     REFERRING PHYSICIAN: Rolm Bookbinder, MD   DIAGNOSIS: The encounter diagnosis was Malignant neoplasm of upper-outer quadrant of left breast in female, estrogen receptor positive ([place]).   HISTORY OF PRESENT ILLNESS: Michelle [name] is a 65 y.o. female seen at the request of Dr. Donne Hazel for screening detected mass in the left breast.  Further work-up revealed a 5 mm area in the left breast at approximately 12 o'clock position of concern.  There was also a 1.3 cm lesion in the 3 o'clock position, her left axilla was negative for adenopathy.  Biopsies on 05/05/2020 showed benign findings in the 3:00 lesion and were felt to be concordant.  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  The tumor was ER/PR positive, HER-2 was negative and Ki-67 was 2%.  Her case was discussed in multidisciplinary breast oncology conference and it was felt that she would be benefited by undergoing lumpectomy with sentinel lymph node biopsy, adjuvant radiotherapy and antiestrogen therapy.  She is seen today to discuss treatment of her cancer.   PREVIOUS RADIATION THERAPY: No   PAST MEDICAL HISTORY:  Past Medical History:  Diagnosis Date  . Allergy   . Asthma   . Bronchitis   . Cancer ([place])   . COPD (chronic obstructive pulmonary disease) (H[facility])    mild  . Diabetes mellitus without complication ([place])   . History of hiatal hernia   . History of kidney stones   . Hyperlipidemia   . Hypertension    stress induced/ work makes it worse.  Marland Kitchen Hypothyroidism   . Neuromuscular disorder ([facility])    Neuropathy  . Obesity   . Pneumonia   . Thyroid disease        PAST SURGICAL HISTORY: Past Surgical History:   Procedure Laterality Date  . TONSILLECTOMY AND ADENOIDECTOMY  childhood  . TUBAL LIGATION    . VAGINAL HYSTERECTOMY  2011     FAMILY HISTORY:  Family History  Problem Relation Age of Onset  . Heart disease Mother   . Hyperlipidemia Mother   . Stroke Mother   . Hypertension Mother   . Heart disease Father   . Colon polyps Brother   . Prostate cancer Brother   . Colon cancer Neg Hx   . Stomach cancer Neg Hx   . Rectal cancer Neg Hx   . Esophageal cancer Neg Hx      SOCIAL HISTORY:  reports that she has never smoked. She has never used smokeless tobacco. She reports that she does not drink alcohol and does not use drugs.   ALLERGIES: Influenza vaccine live, Shrimp [shellfish allergy], Tdap [tetanus-diphth-acell pertussis], Demerol [meperidine], Peanut-containing drug products, Chlorhexidine gluconate [chlorhexidine], and Latex   MEDICATIONS:  Current Outpatient Medications  Medication Sig Dispense Refill  . acetaminophen (TYLENOL) 500 MG tablet Take 1,000 mg by mouth 2 (two) times daily.    Marland Kitchen albuterol (VENTOLIN HFA) 108 (90 Base) MCG/ACT inhaler INHALE 1 PUFF BY MOUTH INTO LUNGS EVERY 8 HOURS AS NEEDED FOR WHEEZING OR SHORTNESS OF BREATH (Patient taking differently: Inhale 1 puff into the lungs every 8 (eight) hours as needed for wheezing or shortness of breath.) 8.5 g 1  .  Alpha-Lipoic Acid 600 MG TABS Take 600 mg by mouth daily.    Marland Kitchen amLODipine (NORVASC) 5 MG tablet TAKE 1 TABLET (5 MG TOTAL) BY MOUTH DAILY. (Patient taking differently: Take 5 mg by mouth daily.) 90 tablet 1  . Ascorbic Acid (VITAMIN C) 1000 MG tablet Take 1,000 mg by mouth 2 (two) times daily.    . budesonide-formoterol (SYMBICORT) 80-4.5 MCG/ACT inhaler INHALE 2 PUFFS BY MOUTH INTO THE LUNGS IN THE MORNING AND AT BEDTIME. (Patient taking differently: Inhale 1-2 puffs into the lungs 2 (two) times daily.) 10.2 g 5  . Calcium-Magnesium (CAL-MAG PO) Take 1 tablet by mouth 3 (three) times daily.    . cefdinir  (OMNICEF) 300 MG capsule TAKE 1 CAPSULE BY MOUTH TWICE DAILY (Patient not taking: No sig reported) 10 capsule 0  . Cholecalciferol (DIALYVITE VITAMIN D 5000) 125 MCG (5000 UT) capsule Take 5,000 Units by mouth daily.    . CHROMIUM PO Take 2 tablets by mouth 2 (two) times daily.    Marland Kitchen CINNAMON PO Take 125 mg by mouth 3 (three) times daily.    Marland Kitchen co-enzyme Q-10 50 MG capsule Take 100 mg by mouth daily.    . cyclobenzaprine (FLEXERIL) 10 MG tablet Take 1 tablet (10 mg total) by mouth 3 (three) times daily as needed for muscle spasms. 30 tablet 0  . Digestive Enzyme CAPS Take 1 capsule by mouth 3 (three) times daily with meals.    . fluticasone (FLONASE) 50 MCG/ACT nasal spray Place 1 spray into both nostrils 2 (two) times daily.    Marland Kitchen GARLIC PO Take 1 tablet by mouth 3 (three) times daily.    . hydrochlorothiazide (HYDRODIURIL) 25 MG tablet TAKE 1 TABLET (25 MG TOTAL) BY MOUTH DAILY. (Patient taking differently: Take 25 mg by mouth daily.) 90 tablet 1  . Levomefolic Acid (5-MTHF PO) Take 1,000 mcg by mouth daily.    Marland Kitchen loratadine (CLARITIN) 10 MG tablet Take 10 mg by mouth at bedtime.    . Melatonin 10 MG CAPS Take 10 mg by mouth at bedtime.    . Menaquinone-7 (VITAMIN K2 PO) Take 1 tablet by mouth daily.    . metFORMIN (GLUCOPHAGE-XR) 500 MG 24 hr tablet TAKE 1 TABLET BY MOUTH TWICE DAILY (Patient taking differently: Take 500 mg by mouth 2 (two) times daily.) 180 tablet 1  . Methylcobalamin 50000 MCG SOLR Inject 5,000 mcg as directed 2 (two) times a week. Tuesdays and Saturdays    . MULTIPLE VITAMIN PO Take 2 capsules by mouth in the morning, at noon, and at bedtime.    Marland Kitchen NALTREXONE HCL PO Take 3 mg by mouth at bedtime. Natrexone er 1.5 mg capsules x 2    . nystatin (MYCOSTATIN) 500000 units TABS tablet TAKE 1 TABLET BY MOUTH TWICE DAILY (Patient not taking: No sig reported) 60 tablet 1  . Omega-3 Fatty Acids (THE VERY FINEST FISH OIL) LIQD Take 15 mLs by mouth daily.    Marland Kitchen POTASSIUM IODIDE PO Take 225  mcg by mouth 3 (three) times daily.    . predniSONE (DELTASONE) 20 MG tablet TAKE 2 TABLETS (40 MG TOTAL) BY MOUTH DAILY WITH BREAKFAST FOR 5 DAYS. (Patient not taking: No sig reported) 10 tablet 0  . Probiotic Product (PROBIOTIC PO) Take 1 capsule by mouth at bedtime.    Marland Kitchen QUERCETIN PO Take 1 tablet by mouth 3 (three) times daily.    . simvastatin (ZOCOR) 20 MG tablet TAKE 1 TABLET (20 MG TOTAL) BY MOUTH DAILY AT  6 PM. (Patient taking differently: Take 20 mg by mouth at bedtime.) 90 tablet 1  . thyroid (ARMOUR) 90 MG tablet Take 45 mg by mouth daily.    . TURMERIC PO Take 1,500 mg by mouth 2 (two) times daily.    . valACYclovir (VALTREX) 1000 MG tablet TAKE 1 TABLET BY MOUTH TWICE DAILY (Patient not taking: No sig reported) 60 tablet 1  . VITAMIN A PO Take 20,000 Units by mouth daily.    Marland Kitchen VITAMIN E PO Take 260 mg by mouth daily.    . Zinc 30 MG CAPS Take 30 mg by mouth 2 (two) times daily.     Current Facility-Administered Medications  Medication Dose Route Frequency Provider Last Rate Last Admin  . 0.9 %  sodium chloride infusion  500 mL Intravenous Continuous Nandigam, Venia Minks, MD         REVIEW OF SYSTEMS: On review of systems, the patient reports that she is doing well overall. She reports she is doing well since her biopsy without concerns of pain.      PHYSICAL EXAM:  Wt Readings from Last 3 Encounters:  06/03/20 163 lb 8 oz (74.2 kg)  05/13/20 165 lb 12.8 oz (75.2 kg)  11/06/19 162 lb (73.5 kg)   Temp Readings from Last 3 Encounters:  06/03/20 97.7 F (36.5 C) (Oral)  05/13/20 98.1 F (36.7 C) (Oral)  11/06/19 98.5 F (36.9 C) (Oral)   BP Readings from Last 3 Encounters:  06/03/20 (!) 169/72  05/13/20 (!) 150/66  11/06/19 130/70   Pulse Readings from Last 3 Encounters:  06/03/20 95  05/13/20 91  11/06/19 90    In general this is a well appearing caucasian female in no acute distress. She's alert and oriented x4 and appropriate throughout the examination.  Cardiopulmonary assessment is negative for acute distress and she exhibits normal effort. Bilateral breast exam is deferred.    ECOG = 0  0 - Asymptomatic (Fully active, able to carry on all predisease activities without restriction)  1 - Symptomatic but completely ambulatory (Restricted in physically strenuous activity but ambulatory and able to carry out work of a light or sedentary nature. For example, light housework, office work)  2 - Symptomatic, <50% in bed during the day (Ambulatory and capable of all self care but unable to carry out any work activities. Up and about more than 50% of waking hours)  3 - Symptomatic, >50% in bed, but not bedbound (Capable of only limited self-care, confined to bed or chair 50% or more of waking hours)  4 - Bedbound (Completely disabled. Cannot carry on any self-care. Totally confined to bed or chair)  5 - Death   Eustace Pen MM, Creech RH, Tormey DC, et al. [phone]). "Toxicity and response criteria of the [facility]". [place] Oncol. 5 (6): 649-55    LABORATORY DATA:  Lab Results  Component Value Date   WBC 6.0 06/03/2020   HGB 13.8 06/03/2020   HCT 41.9 06/03/2020   MCV 91.7 06/03/2020   PLT 375 06/03/2020   Lab Results  Component Value Date   NA 131 (L) 06/03/2020   K 4.1 06/03/2020   CL 93 (L) 06/03/2020   CO2 29 06/03/2020   Lab Results  Component Value Date   ALT 17 05/13/2020   AST 17 05/13/2020   ALKPHOS 42 05/13/2020   BILITOT 0.7 05/13/2020      RADIOGRAPHY: No results found.     IMPRESSION/PLAN: 1. Stage IA, [id number]  grade 1, ER/PR positive invasive ductal carcinoma of the left breast. Dr. Lisbeth Renshaw discusses the pathology findings and reviews the nature of early stage breast disease. The consensus from the breast conference includes breast conservation with lumpectomy with sentinel node biopsy. Depending on the size of the final tumor measurements rendered by pathology, the tumor may be tested for  Oncotype Dx score to determine a role for systemic therapy. Provided that chemotherapy is not indicated, the patient's course would then be followed by external radiotherapy to the breast  to reduce risks of local recurrence followed by antiestrogen therapy. We discussed the risks, benefits, short, and long term effects of radiotherapy, as well as the curative intent, and the patient is interested in proceeding. Dr. Lisbeth Renshaw discusses the delivery and logistics of radiotherapy and anticipates a course of 4 weeks of radiotherapy to the left breast with deep inspiration breath hold technique. We will see her back a few weeks after surgery to discuss the simulation process and anticipate we starting radiotherapy about 4-6 weeks after surgery.    In a visit lasting 45 minutes, greater than 50% of the time was spent face to face reviewing her case, as well as in preparation of, discussing, and coordinating the patient's care.  The above documentation reflects my direct findings during this shared patient visit. Please see the separate note by Dr. Lisbeth Renshaw on this date for the remainder of the patient's plan of care.    Carola Rhine, [facility]    **Disclaimer: This note was dictated with voice recognition software. Similar sounding words can inadvertently be transcribed and this note may contain transcription errors which may not have been corrected upon publication of note.**

## 2020-06-06 ENCOUNTER — Other Ambulatory Visit: Payer: Self-pay | Admitting: Family Medicine

## 2020-06-06 ENCOUNTER — Other Ambulatory Visit (HOSPITAL_BASED_OUTPATIENT_CLINIC_OR_DEPARTMENT_OTHER): Payer: Self-pay

## 2020-06-06 DIAGNOSIS — E785 Hyperlipidemia, unspecified: Secondary | ICD-10-CM

## 2020-06-06 DIAGNOSIS — I10 Essential (primary) hypertension: Secondary | ICD-10-CM

## 2020-06-06 MED ORDER — AMLODIPINE BESYLATE 5 MG PO TABS
ORAL_TABLET | Freq: Every day | ORAL | 1 refills | Status: DC
  Filled 2020-06-06: qty 90, 90d supply, fill #0
  Filled 2020-09-09: qty 90, 90d supply, fill #1

## 2020-06-06 MED ORDER — HYDROCHLOROTHIAZIDE 25 MG PO TABS
ORAL_TABLET | Freq: Every day | ORAL | 1 refills | Status: DC
  Filled 2020-06-06: qty 90, 90d supply, fill #0
  Filled 2020-09-09: qty 90, 90d supply, fill #1

## 2020-06-09 ENCOUNTER — Other Ambulatory Visit (HOSPITAL_COMMUNITY)
Admission: RE | Admit: 2020-06-09 | Discharge: 2020-06-09 | Disposition: A | Discharge status: Home / Self Care | Payer: PPO | Source: Ambulatory Visit | Attending: General Surgery | Admitting: General Surgery

## 2020-06-09 DIAGNOSIS — Z01812 Encounter for preprocedural laboratory examination: Secondary | ICD-10-CM | POA: Diagnosis not present

## 2020-06-09 DIAGNOSIS — Z20822 Contact with and (suspected) exposure to covid-19: Secondary | ICD-10-CM | POA: Insufficient documentation

## 2020-06-10 ENCOUNTER — Other Ambulatory Visit: Payer: Self-pay | Admitting: General Surgery

## 2020-06-10 ENCOUNTER — Ambulatory Visit
Admission: RE | Admit: 2020-06-10 | Discharge: 2020-06-10 | Disposition: A | Discharge status: Home / Self Care | Payer: PPO | Source: Ambulatory Visit | Attending: General Surgery | Admitting: General Surgery

## 2020-06-10 ENCOUNTER — Other Ambulatory Visit: Payer: Self-pay

## 2020-06-10 DIAGNOSIS — Z170 Estrogen receptor positive status [ER+]: Secondary | ICD-10-CM

## 2020-06-10 DIAGNOSIS — C50412 Malignant neoplasm of upper-outer quadrant of left female breast: Secondary | ICD-10-CM

## 2020-06-10 DIAGNOSIS — N6325 Unspecified lump in the left breast, overlapping quadrants: Secondary | ICD-10-CM | POA: Diagnosis not present

## 2020-06-10 LAB — SARS CORONAVIRUS 2 (TAT 6-24 HRS): SARS Coronavirus 2: NEGATIVE

## 2020-06-10 NOTE — Progress Notes (Signed)
Called patient and informed of time change and to pls arrive at 0630.

## 2020-06-11 ENCOUNTER — Other Ambulatory Visit (HOSPITAL_COMMUNITY): Payer: PPO

## 2020-06-11 ENCOUNTER — Encounter (HOSPITAL_COMMUNITY): Payer: Self-pay | Admitting: General Surgery

## 2020-06-11 ENCOUNTER — Ambulatory Visit (HOSPITAL_COMMUNITY)
Admission: RE | Admit: 2020-06-11 | Discharge: 2020-06-11 | Disposition: A | Discharge status: Home / Self Care | Payer: PPO | Attending: General Surgery | Admitting: General Surgery

## 2020-06-11 ENCOUNTER — Other Ambulatory Visit (HOSPITAL_BASED_OUTPATIENT_CLINIC_OR_DEPARTMENT_OTHER): Payer: Self-pay

## 2020-06-11 ENCOUNTER — Ambulatory Visit (HOSPITAL_COMMUNITY): Payer: PPO | Admitting: Vascular Surgery

## 2020-06-11 ENCOUNTER — Ambulatory Visit
Admission: RE | Admit: 2020-06-11 | Discharge: 2020-06-11 | Disposition: A | Discharge status: Home / Self Care | Payer: PPO | Source: Ambulatory Visit | Attending: General Surgery | Admitting: General Surgery

## 2020-06-11 ENCOUNTER — Ambulatory Visit (HOSPITAL_COMMUNITY): Payer: PPO | Admitting: Anesthesiology

## 2020-06-11 ENCOUNTER — Ambulatory Visit (HOSPITAL_COMMUNITY)
Admission: RE | Admit: 2020-06-11 | Discharge: 2020-06-11 | Disposition: A | Discharge status: Home / Self Care | Payer: PPO | Source: Ambulatory Visit | Attending: General Surgery | Admitting: General Surgery

## 2020-06-11 ENCOUNTER — Other Ambulatory Visit: Payer: Self-pay

## 2020-06-11 ENCOUNTER — Encounter (HOSPITAL_COMMUNITY)
Admission: RE | Disposition: A | Discharge status: Home / Self Care | Payer: Self-pay | Source: Home / Self Care | Attending: General Surgery

## 2020-06-11 DIAGNOSIS — Z79899 Other long term (current) drug therapy: Secondary | ICD-10-CM | POA: Diagnosis not present

## 2020-06-11 DIAGNOSIS — Z91040 Latex allergy status: Secondary | ICD-10-CM | POA: Diagnosis not present

## 2020-06-11 DIAGNOSIS — C50412 Malignant neoplasm of upper-outer quadrant of left female breast: Secondary | ICD-10-CM

## 2020-06-11 DIAGNOSIS — E559 Vitamin D deficiency, unspecified: Secondary | ICD-10-CM | POA: Diagnosis not present

## 2020-06-11 DIAGNOSIS — G8918 Other acute postprocedural pain: Secondary | ICD-10-CM | POA: Diagnosis not present

## 2020-06-11 DIAGNOSIS — Z8042 Family history of malignant neoplasm of prostate: Secondary | ICD-10-CM | POA: Insufficient documentation

## 2020-06-11 DIAGNOSIS — Z7951 Long term (current) use of inhaled steroids: Secondary | ICD-10-CM | POA: Diagnosis not present

## 2020-06-11 DIAGNOSIS — C50912 Malignant neoplasm of unspecified site of left female breast: Secondary | ICD-10-CM | POA: Diagnosis not present

## 2020-06-11 DIAGNOSIS — Z170 Estrogen receptor positive status [ER+]: Secondary | ICD-10-CM

## 2020-06-11 DIAGNOSIS — Z7984 Long term (current) use of oral hypoglycemic drugs: Secondary | ICD-10-CM | POA: Diagnosis not present

## 2020-06-11 DIAGNOSIS — Z885 Allergy status to narcotic agent status: Secondary | ICD-10-CM | POA: Diagnosis not present

## 2020-06-11 DIAGNOSIS — R928 Other abnormal and inconclusive findings on diagnostic imaging of breast: Secondary | ICD-10-CM | POA: Diagnosis not present

## 2020-06-11 HISTORY — PX: BREAST LUMPECTOMY WITH RADIOACTIVE SEED AND SENTINEL LYMPH NODE BIOPSY: SHX6550

## 2020-06-11 LAB — GLUCOSE, CAPILLARY
Glucose-Capillary: 125 mg/dL — ABNORMAL HIGH (ref 70–99)
Glucose-Capillary: 147 mg/dL — ABNORMAL HIGH (ref 70–99)

## 2020-06-11 SURGERY — BREAST LUMPECTOMY WITH RADIOACTIVE SEED AND SENTINEL LYMPH NODE BIOPSY
Anesthesia: Regional | Site: Breast | Laterality: Left

## 2020-06-11 MED ORDER — KETOROLAC TROMETHAMINE 30 MG/ML IJ SOLN
30.0000 mg | Freq: Once | INTRAMUSCULAR | Status: AC | PRN
  Administered 2020-06-11: 30 mg via INTRAVENOUS

## 2020-06-11 MED ORDER — DEXAMETHASONE SODIUM PHOSPHATE 10 MG/ML IJ SOLN
INTRAMUSCULAR | Status: AC
  Filled 2020-06-11: qty 1

## 2020-06-11 MED ORDER — LACTATED RINGERS IV SOLN: INTRAVENOUS | Status: DC

## 2020-06-11 MED ORDER — ONDANSETRON HCL 4 MG/2ML IJ SOLN
INTRAMUSCULAR | Status: AC
  Filled 2020-06-11: qty 2

## 2020-06-11 MED ORDER — FENTANYL CITRATE (PF) 250 MCG/5ML IJ SOLN
INTRAMUSCULAR | Status: AC
  Filled 2020-06-11: qty 5

## 2020-06-11 MED ORDER — ACETAMINOPHEN 500 MG PO TABS
1000.0000 mg | ORAL_TABLET | ORAL | Status: AC
  Administered 2020-06-11: 1000 mg via ORAL
  Filled 2020-06-11: qty 2

## 2020-06-11 MED ORDER — FENTANYL CITRATE (PF) 250 MCG/5ML IJ SOLN
INTRAMUSCULAR | Status: DC | PRN
  Administered 2020-06-11 (×2): 50 ug via INTRAVENOUS

## 2020-06-11 MED ORDER — ACETAMINOPHEN 325 MG PO TABS: 650.0000 mg | ORAL_TABLET | ORAL | Status: DC | PRN

## 2020-06-11 MED ORDER — DEXAMETHASONE SODIUM PHOSPHATE 10 MG/ML IJ SOLN
INTRAMUSCULAR | Status: DC | PRN
  Administered 2020-06-11: 5 mg via INTRAVENOUS

## 2020-06-11 MED ORDER — LIDOCAINE 2% (20 MG/ML) 5 ML SYRINGE
INTRAMUSCULAR | Status: AC
  Filled 2020-06-11: qty 5

## 2020-06-11 MED ORDER — METHYLENE BLUE 0.5 % INJ SOLN
INTRAVENOUS | Status: AC
  Filled 2020-06-11: qty 10

## 2020-06-11 MED ORDER — ROCURONIUM BROMIDE 10 MG/ML (PF) SYRINGE
PREFILLED_SYRINGE | INTRAVENOUS | Status: AC
  Filled 2020-06-11: qty 10

## 2020-06-11 MED ORDER — 0.9 % SODIUM CHLORIDE (POUR BTL) OPTIME
TOPICAL | Status: DC | PRN
  Administered 2020-06-11: 1000 mL

## 2020-06-11 MED ORDER — TECHNETIUM TC 99M TILMANOCEPT KIT
1.0000 | PACK | Freq: Once | INTRAVENOUS | Status: AC | PRN
  Administered 2020-06-11: 1 via INTRADERMAL

## 2020-06-11 MED ORDER — PROMETHAZINE HCL 25 MG/ML IJ SOLN: 6.2500 mg | INTRAMUSCULAR | Status: DC | PRN

## 2020-06-11 MED ORDER — FENTANYL CITRATE (PF) 100 MCG/2ML IJ SOLN
INTRAMUSCULAR | Status: AC
  Filled 2020-06-11: qty 2

## 2020-06-11 MED ORDER — ACETAMINOPHEN 650 MG RE SUPP: 650.0000 mg | RECTAL | Status: DC | PRN

## 2020-06-11 MED ORDER — PROPOFOL 10 MG/ML IV BOLUS
INTRAVENOUS | Status: AC
  Filled 2020-06-11: qty 20

## 2020-06-11 MED ORDER — MIDAZOLAM HCL 2 MG/2ML IJ SOLN
INTRAMUSCULAR | Status: AC
  Filled 2020-06-11: qty 2

## 2020-06-11 MED ORDER — OXYCODONE HCL 5 MG PO TABS: 5.0000 mg | ORAL_TABLET | ORAL | Status: DC | PRN

## 2020-06-11 MED ORDER — CHLORHEXIDINE GLUCONATE 0.12 % MT SOLN
15.0000 mL | Freq: Once | OROMUCOSAL | Status: DC
  Filled 2020-06-11: qty 15

## 2020-06-11 MED ORDER — HYDROMORPHONE HCL 1 MG/ML IJ SOLN
0.2500 mg | INTRAMUSCULAR | Status: DC | PRN
Start: 2020-06-11 — End: 2020-06-11

## 2020-06-11 MED ORDER — PROPOFOL 10 MG/ML IV BOLUS
INTRAVENOUS | Status: DC | PRN
  Administered 2020-06-11: 150 mg via INTRAVENOUS

## 2020-06-11 MED ORDER — OXYCODONE HCL 5 MG PO TABS: 5.0000 mg | ORAL_TABLET | Freq: Once | ORAL | Status: DC | PRN

## 2020-06-11 MED ORDER — KETOROLAC TROMETHAMINE 30 MG/ML IJ SOLN
INTRAMUSCULAR | Status: AC
  Filled 2020-06-11: qty 1

## 2020-06-11 MED ORDER — SODIUM CHLORIDE 0.9% FLUSH: 3.0000 mL | INTRAVENOUS | Status: DC | PRN

## 2020-06-11 MED ORDER — MIDAZOLAM HCL 5 MG/5ML IJ SOLN
INTRAMUSCULAR | Status: DC | PRN
  Administered 2020-06-11: 2 mg via INTRAVENOUS

## 2020-06-11 MED ORDER — SODIUM CHLORIDE (PF) 0.9 % IJ SOLN
INTRAMUSCULAR | Status: AC
  Filled 2020-06-11: qty 10

## 2020-06-11 MED ORDER — PHENYLEPHRINE HCL (PRESSORS) 10 MG/ML IV SOLN
INTRAVENOUS | Status: DC | PRN
  Administered 2020-06-11 (×2): 80 ug via INTRAVENOUS
  Administered 2020-06-11: 40 ug via INTRAVENOUS

## 2020-06-11 MED ORDER — TRAMADOL HCL 50 MG PO TABS
100.0000 mg | ORAL_TABLET | Freq: Four times a day (QID) | ORAL | 0 refills | Status: DC | PRN
  Filled 2020-06-11: qty 10, 2d supply, fill #0

## 2020-06-11 MED ORDER — SODIUM CHLORIDE 0.9 % IV SOLN: 250.0000 mL | INTRAVENOUS | Status: DC | PRN

## 2020-06-11 MED ORDER — BUPIVACAINE-EPINEPHRINE (PF) 0.25% -1:200000 IJ SOLN
INTRAMUSCULAR | Status: AC
  Filled 2020-06-11: qty 30

## 2020-06-11 MED ORDER — SODIUM CHLORIDE 0.9 % IV SOLN: INTRAVENOUS | Status: DC

## 2020-06-11 MED ORDER — BUPIVACAINE-EPINEPHRINE 0.25% -1:200000 IJ SOLN
INTRAMUSCULAR | Status: DC | PRN
  Administered 2020-06-11: 6 mL

## 2020-06-11 MED ORDER — ORAL CARE MOUTH RINSE: 15.0000 mL | Freq: Once | OROMUCOSAL | Status: DC

## 2020-06-11 MED ORDER — ENSURE PRE-SURGERY PO LIQD: 296.0000 mL | Freq: Once | ORAL | Status: DC

## 2020-06-11 MED ORDER — OXYCODONE HCL 5 MG/5ML PO SOLN
5.0000 mg | Freq: Once | ORAL | Status: DC | PRN
Start: 2020-06-11 — End: 2020-06-11

## 2020-06-11 MED ORDER — SODIUM CHLORIDE 0.9% FLUSH: 3.0000 mL | Freq: Two times a day (BID) | INTRAVENOUS | Status: DC

## 2020-06-11 MED ORDER — ONDANSETRON HCL 4 MG/2ML IJ SOLN
INTRAMUSCULAR | Status: DC | PRN
  Administered 2020-06-11: 4 mg via INTRAVENOUS

## 2020-06-11 MED ORDER — CEFAZOLIN SODIUM-DEXTROSE 2-4 GM/100ML-% IV SOLN
2.0000 g | INTRAVENOUS | Status: AC
  Administered 2020-06-11: 2 g via INTRAVENOUS
  Filled 2020-06-11: qty 100

## 2020-06-11 MED ORDER — LIDOCAINE 2% (20 MG/ML) 5 ML SYRINGE
INTRAMUSCULAR | Status: DC | PRN
  Administered 2020-06-11: 20 mg via INTRAVENOUS

## 2020-06-11 SURGICAL SUPPLY — 45 items
APPLIER CLIP 9.375 MED OPEN (MISCELLANEOUS)
BINDER BREAST LRG (GAUZE/BANDAGES/DRESSINGS) IMPLANT
BINDER BREAST XLRG (GAUZE/BANDAGES/DRESSINGS) ×2 IMPLANT
CANISTER SUCT 3000ML PPV (MISCELLANEOUS) ×2 IMPLANT
CHLORAPREP W/TINT 26 (MISCELLANEOUS) ×2 IMPLANT
CLIP APPLIE 9.375 MED OPEN (MISCELLANEOUS) IMPLANT
CLIP VESOCCLUDE MED 6/CT (CLIP) IMPLANT
COVER PROBE W GEL 5X96 (DRAPES) ×2 IMPLANT
COVER SURGICAL LIGHT HANDLE (MISCELLANEOUS) ×2 IMPLANT
COVER WAND RF STERILE (DRAPES) ×2 IMPLANT
DERMABOND ADVANCED (GAUZE/BANDAGES/DRESSINGS) ×1
DERMABOND ADVANCED .7 DNX12 (GAUZE/BANDAGES/DRESSINGS) ×1 IMPLANT
DEVICE DUBIN SPECIMEN MAMMOGRA (MISCELLANEOUS) ×2 IMPLANT
DRAPE CHEST BREAST 15X10 FENES (DRAPES) ×2 IMPLANT
DRSG PAD ABDOMINAL 8X10 ST (GAUZE/BANDAGES/DRESSINGS) ×2 IMPLANT
ELECT COATED BLADE 2.86 ST (ELECTRODE) ×2 IMPLANT
ELECT REM PT RETURN 9FT ADLT (ELECTROSURGICAL) ×2
ELECTRODE REM PT RTRN 9FT ADLT (ELECTROSURGICAL) ×1 IMPLANT
GLOVE BIO SURGEON STRL SZ7 (GLOVE) ×2 IMPLANT
GLOVE BIOGEL PI IND STRL 7.5 (GLOVE) ×1 IMPLANT
GLOVE BIOGEL PI INDICATOR 7.5 (GLOVE) ×1
GOWN STRL REUS W/ TWL LRG LVL3 (GOWN DISPOSABLE) ×2 IMPLANT
GOWN STRL REUS W/TWL LRG LVL3 (GOWN DISPOSABLE) ×4
ILLUMINATOR WAVEGUIDE N/F (MISCELLANEOUS) IMPLANT
KIT BASIN OR (CUSTOM PROCEDURE TRAY) ×2 IMPLANT
KIT MARKER MARGIN INK (KITS) ×2 IMPLANT
NEEDLE 18GX1X1/2 (RX/OR ONLY) (NEEDLE) IMPLANT
NEEDLE FILTER BLUNT 18X 1/2SAF (NEEDLE)
NEEDLE FILTER BLUNT 18X1 1/2 (NEEDLE) IMPLANT
NEEDLE HYPO 25GX1X1/2 BEV (NEEDLE) ×2 IMPLANT
NS IRRIG 1000ML POUR BTL (IV SOLUTION) ×2 IMPLANT
PACK GENERAL/GYN (CUSTOM PROCEDURE TRAY) ×2 IMPLANT
PENCIL SMOKE EVACUATOR (MISCELLANEOUS) ×2 IMPLANT
RETRACTOR ONETRAX LX 90X20 (MISCELLANEOUS) ×2 IMPLANT
STRIP CLOSURE SKIN 1/2X4 (GAUZE/BANDAGES/DRESSINGS) ×2 IMPLANT
SUT MNCRL AB 4-0 PS2 18 (SUTURE) ×2 IMPLANT
SUT MON AB 5-0 PS2 18 (SUTURE) ×2 IMPLANT
SUT SILK 2 0 SH (SUTURE) ×2 IMPLANT
SUT VIC AB 2-0 SH 27 (SUTURE) ×6
SUT VIC AB 2-0 SH 27XBRD (SUTURE) ×3 IMPLANT
SUT VIC AB 3-0 SH 27 (SUTURE) ×4
SUT VIC AB 3-0 SH 27X BRD (SUTURE) ×2 IMPLANT
SYR CONTROL 10ML LL (SYRINGE) ×2 IMPLANT
TOWEL GREEN STERILE (TOWEL DISPOSABLE) ×2 IMPLANT
TOWEL GREEN STERILE FF (TOWEL DISPOSABLE) ×2 IMPLANT

## 2020-06-11 NOTE — Anesthesia Postprocedure Evaluation (Signed)
Anesthesia Post Note  Patient: Michelle [name]  Procedure(s) Performed: LEFT BREAST LUMPECTOMY WITH RADIOACTIVE SEED AND LEFT AXILLARY SENTINEL LYMPH NODE BIOPSY (Left Breast)     Patient location during evaluation: PACU Anesthesia Type: Regional and General Level of consciousness: awake and alert, oriented and patient cooperative Pain management: pain level controlled Vital Signs Assessment: post-procedure vital signs reviewed and stable Respiratory status: spontaneous breathing, nonlabored ventilation and respiratory function stable Cardiovascular status: blood pressure returned to baseline and stable Postop Assessment: no apparent nausea or vomiting Anesthetic complications: no   No complications documented.  Last Vitals:  Vitals:   06/11/20 1010 06/11/20 1015  BP: (!) 156/63   Pulse: 91 83  Resp: 12 13  Temp:  36.7 C  SpO2: 95% 100%    Last Pain:  Vitals:   06/11/20 1010  TempSrc:   PainSc: [place]

## 2020-06-11 NOTE — H&P (Signed)
65 yof with no prior breast history and fh of only prostate cancer in one relative. she has no mass or dc. she has b density breasts. screening mm showed at 12 oclock a mass. on [place] this measured 5x5x4 mm in size and at 3 oclock there was a 1.3 cm area by [place]. her ax [place] is negative. biopsy of 3 oclock is fcc and concordant. 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 she is here with her daughter to discuss options.  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 Hysterectomy (not due to cancer) - Complete  Tonsillectomy   Diagnostic Studies History Carrolyn Leigh, [place]; 06-07-20 2:32 PM) Colonoscopy  1-5 years ago Mammogram  within last year Pap Smear  1-5 years ago  Allergies Carrolyn Leigh, CMA; 06-07-20 2:33 PM) Demerol *ANALGESICS - OPIOID*  Latex Gloves *MEDICAL DEVICES AND SUPPLIES*   Medication History Carrolyn Leigh, CMA; June 07, 2020 2:34 PM) Albuterol Sulfate HFA (108 (90 Base)MCG/ACT Aerosol Soln, Inhalation) Active. Azithromycin (250MG  Tablet, Oral) Active. Symbicort (80-4.5MCG/ACT Aerosol, Inhalation) Active. hydroCHLOROthiazide (25MG  Tablet, Oral) Active. metFORMIN HCl ER (500MG  Tablet ER 24HR, Oral) Active. predniSONE (10MG  Tablet, Oral) Active. predniSONE (20MG  Tablet, Oral) Active. Simvastatin (20MG  Tablet, Oral) Active. Cyclobenzaprine HCl (10MG  Tablet, Oral) Active. amLODIPine Besylate (5MG  Tablet, Oral) Active. Medications Reconciled  Social History Carrolyn Leigh, [place]; 2020-06-07 2:32 PM) Alcohol use  Occasional alcohol use. Caffeine use  Coffee, Tea. No drug use  Tobacco use  Never smoker.  Family History Carrolyn Leigh, [place]; 07-Jun-2020 2:32 PM) Alcohol Abuse  Father, Mother. Arthritis  Brother, Mother. Colon Polyps  Brother. Heart Disease  Father, Mother. Hypertension  Brother, Father, Son. Migraine Headache  Son. Respiratory  Condition  Daughter.  Pregnancy / Birth History Carrolyn Leigh, [place]; 2020/06/07 2:32 PM) Age at menarche  46 years. Age of menopause  51-55 Contraceptive History  Intrauterine device. Gravida  7 Length (months) of breastfeeding  12-24 Maternal age  18-30 Para  35  Other Problems Carrolyn Leigh, [place]; 07-Jun-2020 2:32 PM) Breast Cancer  Chronic Obstructive Lung Disease  Diabetes Mellitus  Gastroesophageal Reflux Disease  High blood pressure  Hypercholesterolemia  Kidney Stone     Review of Systems Carrolyn Leigh CMA; 06-07-2020 2:32 PM) General Present- Fatigue. Not Present- Appetite Loss, Chills, Fever, Night Sweats, Weight Gain and Weight Loss. HEENT Present- Wears glasses/contact lenses. Not Present- Earache, Hearing Loss, Hoarseness, Nose Bleed, Oral Ulcers, Ringing in the Ears, Seasonal Allergies, Sinus Pain, Sore Throat, Visual Disturbances and Yellow Eyes. Cardiovascular Not Present- Chest Pain, Difficulty Breathing Lying Down, Leg Cramps, Palpitations, Rapid Heart Rate, Shortness of Breath and Swelling of Extremities. Gastrointestinal Not Present- Abdominal Pain, Bloating, Bloody Stool, Change in Bowel Habits, Chronic diarrhea, Constipation, Difficulty Swallowing, Excessive gas, Gets full quickly at meals, Hemorrhoids, Indigestion, Nausea, Rectal Pain and Vomiting. Neurological Present- Headaches and Tingling. Not Present- Decreased Memory, Fainting, Numbness, Seizures, Tremor, Trouble walking and Weakness. Psychiatric Not Present- Anxiety, Bipolar, Change in Sleep Pattern, Depression, Fearful and Frequent crying. Endocrine Not Present- Cold Intolerance, Excessive Hunger, Hair Changes, Heat Intolerance, Hot flashes and New Diabetes.   Physical Exam Rolm Bookbinder MD; 06/07/20 3:56 PM) General Mental Status-Alert. Orientation-Oriented X3.  Breast Nipples-No Discharge. Breast Lump-No Palpable Breast Mass.  Lymphatic Head & Neck  General Head & Neck Lymphatics:  Bilateral - Description - Normal. Axillary  General Axillary Region:  Bilateral - Description - Normal. Note: no [place] adenopathy     Assessment & Plan Rolm Bookbinder MD; 05/22/2020 3:56 PM) BREAST CANCER OF UPPER-OUTER QUADRANT OF LEFT FEMALE BREAST (C50.412) Story: Left breast seed guided lumpectomy, left axillary sn biopsy, L-Dex preop, med onc and rad onc We discussed the staging and pathophysiology of breast cancer. We discussed all of the different options for treatment for breast cancer including surgery, chemotherapy, radiation therapy, Herceptin, and antiestrogen therapy. We discussed a sentinel lymph node biopsy as she does not appear to having lymph node involvement right now. We discussed the performance of that with injection of radioactive tracer. We discussed that there is a chance of having a positive node with a sentinel lymph node biopsy and we will await the permanent pathology to make any other first further decisions in terms of her treatment. We discussed up to a 5% risk lifetime of chronic shoulder pain as well as lymphedema associated with a sentinel lymph node biopsy. We discussed the options for treatment of the breast cancer which included lumpectomy versus a mastectomy. We discussed the performance of the lumpectomy with radioactive seed placement. We discussed a 5-10% chance of a positive margin requiring reexcision in the operating room. We also discussed that she will likely need radiation therapy if she undergoes lumpectomy. We discussed mastectomy and the postoperative care for that as well. Mastectomy can be followed by reconstruction. The decision for lumpectomy vs mastectomy has no impact on decision for chemotherapy. Most mastectomy patients will not need radiation therapy. We discussed that there is no difference in her survival whether she undergoes lumpectomy with radiation therapy or antiestrogen therapy versus a mastectomy. There is also no real difference  between her recurrence in the breast. We discussed the risks of operation including bleeding, infection, possible reoperation. She understands her further therapy will be based on what her stages at the time of her operation.

## 2020-06-11 NOTE — Interval H&P Note (Signed)
History and Physical Interval Note:  06/11/2020 6:52 AM  Michelle [name]  has presented today for surgery, with the diagnosis of LEFT BREAST CANCER.  The various methods of treatment have been discussed with the patient and family. After consideration of risks, benefits and other options for treatment, the patient has consented to  Procedure(s) with comments: LEFT BREAST LUMPECTOMY WITH RADIOACTIVE SEED AND LEFT AXILLARY SENTINEL LYMPH NODE BIOPSY (Left) - 60 MINUTES as a surgical intervention.  The patient's history has been reviewed, patient examined, no change in status, stable for surgery.  I have reviewed the patient's chart and labs.  Questions were answered to the patient's satisfaction.     Rolm Bookbinder

## 2020-06-11 NOTE — Anesthesia Procedure Notes (Signed)
Procedure Name: LMA Insertion Date/Time: 06/11/2020 8:36 AM Performed by: Kyung Rudd, CRNA Pre-anesthesia Checklist: Patient identified, Emergency Drugs available, Suction available and Patient being monitored Patient Re-evaluated:Patient Re-evaluated prior to induction Oxygen Delivery Method: Circle system utilized Preoxygenation: Pre-oxygenation with 100% oxygen Induction Type: IV induction LMA: LMA inserted LMA Size: 4.0 Number of attempts: 1 Placement Confirmation: positive ETCO2 and breath sounds checked- equal and bilateral Tube secured with: Tape Dental Injury: Teeth and Oropharynx as per pre-operative assessment

## 2020-06-11 NOTE — Discharge Instructions (Signed)
[facility] Office Phone Number [phone]  BREAST BIOPSY/ PARTIAL MASTECTOMY: POST OP INSTRUCTIONS Take 400 mg of ibuprofen every 8 hours or 650 mg tylenol every 6 hours for next 72 hours then as needed. Use ice several times daily also. Always review your discharge instruction sheet given to you by the facility where your surgery was performed.  IF YOU HAVE DISABILITY OR FAMILY LEAVE FORMS, YOU MUST BRING THEM TO THE OFFICE FOR PROCESSING.  DO NOT GIVE THEM TO YOUR DOCTOR.  1. A prescription for pain medication may be given to you upon discharge.  Take your pain medication as prescribed, if needed.  If narcotic pain medicine is not needed, then you may take acetaminophen (Tylenol), naprosyn (Alleve) or ibuprofen (Advil) as needed. 2. Take your usually prescribed medications unless otherwise directed 3. If you need a refill on your pain medication, please contact your pharmacy.  They will contact our office to request authorization.  Prescriptions will not be filled after 5pm or on week-ends. 4. You should eat very light the first 24 hours after surgery, such as soup, crackers, pudding, etc.  Resume your normal diet the day after surgery. 5. Most patients will experience some swelling and bruising in the breast.  Ice packs and a good support bra will help.  Wear the breast binder provided or a sports bra for 72 hours day and night.  After that wear a sports bra during the day until you return to the office. Swelling and bruising can take several days to resolve.  6. It is common to experience some constipation if taking pain medication after surgery.  Increasing fluid intake and taking a stool softener will usually help or prevent this problem from occurring.  A mild laxative (Milk of Magnesia or Miralax) should be taken according to package directions if there are no bowel movements after 48 hours. 7. Unless discharge instructions indicate otherwise, you may remove your bandages 48  hours after surgery and you may shower at that time.  You may have steri-strips (small skin tapes) in place directly over the incision.  These strips should be left on the skin for 7-10 days and will come off on their own.  If your surgeon used skin glue on the incision, you may shower in 24 hours.  The glue will flake off over the next 2-3 weeks.  Any sutures or staples will be removed at the office during your follow-up visit. 8. ACTIVITIES:  You may resume regular daily activities (gradually increasing) beginning the next day.  Wearing a good support bra or sports bra minimizes pain and swelling.  You may have sexual intercourse when it is comfortable. a. You may drive when you no longer are taking prescription pain medication, you can comfortably wear a seatbelt, and you can safely maneuver your car and apply brakes. b. RETURN TO WORK:  ______________________________________________________________________________________ 9. You should see your doctor in the office for a follow-up appointment approximately two weeks after your surgery.  Your doctor's nurse will typically make your follow-up appointment when she calls you with your pathology report.  Expect your pathology report 3-4 business days after your surgery.  You may call to check if you do not hear from us after three days. 10. OTHER INSTRUCTIONS: _______________________________________________________________________________________________ _____________________________________________________________________________________________________________________________________ _____________________________________________________________________________________________________________________________________ _____________________________________________________________________________________________________________________________________  WHEN TO CALL DR Chauncey Bruno: 1. Fever over 101.0 2. Nausea and/or vomiting. 3. Extreme swelling or  bruising. 4. Continued bleeding from incision. 5. Increased pain, redness, or drainage from the incision.  The clinic   staff is available to answer your questions during regular business hours.  Please don't hesitate to call and ask to speak to one of the nurses for clinical concerns.  If you have a medical emergency, go to the nearest emergency room or call 911.  A surgeon from [facility] is always on call at the hospital.  For further questions, please visit [email or web address]  

## 2020-06-11 NOTE — Transfer of Care (Signed)
Immediate Anesthesia Transfer of Care Note  Patient: Michelle [name]  Procedure(s) Performed: LEFT BREAST LUMPECTOMY WITH RADIOACTIVE SEED AND LEFT AXILLARY SENTINEL LYMPH NODE BIOPSY (Left Breast)  Patient Location: PACU  Anesthesia Type:GA combined with regional for post-op pain  Level of Consciousness: awake, alert  and oriented  Airway & Oxygen Therapy: Patient Spontanous Breathing  Post-op Assessment: Report given to RN, Post -op Vital signs reviewed and stable and Patient moving all extremities X 4  Post vital signs: Reviewed and stable  Last Vitals:  Vitals Value Taken Time  BP 146/100 06/11/20 0940  Temp    Pulse 92 06/11/20 0941  Resp 9 06/11/20 0941  SpO2 99 % 06/11/20 0941  Vitals shown include unvalidated device data.  Last Pain:  Vitals:   06/11/20 0654  TempSrc: Oral         Complications: No complications documented.

## 2020-06-11 NOTE — Op Note (Signed)
Preoperative diagnosis: Clinical stage I left breast cancer Postoperative diagnosis: Same as above Procedure: 1.  Left breast radioactive seed guided lumpectomy 2.  Left deep axillary sentinel lymph node biopsy Surgeon: Dr. Serita Grammes Anesthesia: General with a pectoral block Estimated blood loss: Minimal Complications: None Drains: None Specimens: 1.  Left breast tissue marked with paint containing seed and clip 2.  Additional superior, medial and  lateral margins marked short superior, long lateral, double deep Sponge count was correct at completion Disposition to recovery stable addition  Indications:  83 yof with no prior breast history and fh of only prostate cancer in one relative. she has no mass or dc. she has b density breasts. screening mm showed at 12 oclock a mass. on [place] this measured 5x5x4 mm in size and at 3 oclock there was a 1.3 cm area by [place]. her ax [place] is negative. biopsy of 3 oclock is fcc and concordant. 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   Procedure: After informed consent was obtained the patient first underwent a pectoral block.  She had SCDs in place.  She was given antibiotics.  She was then placed under general anesthesia without complication.  She was prepped and draped in the standard sterile surgical fashion.  Surgical timeout was then performed.  I first did the lumpectomy.  I infiltrated marcaine and made a superior periareolar incision in order to hide the incision later.  I then dissected to the seed in the anterior plane.    I then proceeded to remove the seed with neoprobe guidance with an attempt to remove a rim of normal tissue around it.  This was then passed off the table.  I did imaging in the operating room and listened to seed positioning and thought I might be close to the superior,medial and  lateral margins so I excised these and marked these as above.  I obtained hemostasis.  I  mobilized the tissue to bring the breast tissue together.  I did this with 2-0 Vicryl.  I placed a clip in the cavity.  I then closed the skin with 3-0 Vicryl for Monocryl.  Glue and Steri-Strips were applied.  I located a sentinel node in the low axilla.  I then infiltrated some Marcaine at this site.  I made incision carried it through the axillary fascia.  I was able to identify what appeared to be a couple of sentinel lymph nodes with the highest count of 469.  The background radioactivity was less than 10.  There were no other palpable nodes.  I passed these off the table.  I then obtained hemostasis.  I then closed the axillary fascia with 2-0 Vicryl.  The skin was closed with 3-0 Vicryl and 4-0 Monocryl.  Glue and Steri-Strips were applied.  She tolerated this well was extubated transferred to recovery stable.

## 2020-06-12 ENCOUNTER — Other Ambulatory Visit (HOSPITAL_BASED_OUTPATIENT_CLINIC_OR_DEPARTMENT_OTHER): Payer: Self-pay

## 2020-06-12 ENCOUNTER — Other Ambulatory Visit: Payer: Self-pay | Admitting: General Surgery

## 2020-06-12 ENCOUNTER — Encounter (HOSPITAL_COMMUNITY): Payer: Self-pay | Admitting: General Surgery

## 2020-06-12 MED ORDER — ROPIVACAINE HCL 5 MG/ML IJ SOLN
INTRAMUSCULAR | Status: DC | PRN
  Administered 2020-06-11: 20 mL via EPIDURAL

## 2020-06-12 MED ORDER — BUPIVACAINE LIPOSOME 1.3 % IJ SUSP
INTRAMUSCULAR | Status: DC | PRN
  Administered 2020-06-11: 10 mL via PERINEURAL

## 2020-06-12 MED ORDER — TRAMADOL HCL 50 MG PO TABS
100.0000 mg | ORAL_TABLET | Freq: Four times a day (QID) | ORAL | 0 refills | Status: DC | PRN
  Filled 2020-06-12 – 2020-06-13 (×3): qty 10, 2d supply, fill #0

## 2020-06-12 NOTE — Anesthesia Procedure Notes (Signed)
Anesthesia Regional Block: Pectoralis block   Pre-Anesthetic Checklist: ,, timeout performed, Correct Patient, Correct Site, Correct Laterality, Correct Procedure, Correct Position, site marked, Risks and benefits discussed,  Surgical consent,  Pre-op evaluation,  At surgeon's request and post-op pain management  Laterality: Left  Prep: Maximum Sterile Barrier Precautions used, chloraprep       Needles:  Injection technique: Single-shot  Needle Type: Echogenic Stimulator Needle     Needle Length: 9cm  Needle Gauge: 22     Additional Needles:   Procedures:,,,, ultrasound used (permanent image in chart),,,,  Narrative:  Start time: 06/11/2020 8:10 AM End time: 06/11/2020 8:15 AM Injection made incrementally with aspirations every 5 mL.  Performed by: Personally  Anesthesiologist: Pervis Hocking, DO  Additional Notes: Monitors applied. No increased pain on injection. No increased resistance to injection. Injection made in 5cc increments. Good needle visualization. Patient tolerated procedure well.

## 2020-06-12 NOTE — Addendum Note (Signed)
Addendum  created 06/12/20 1501 by Pervis Hocking, DO   Child order released for a procedure order, Clinical Note Signed, Intraprocedure Blocks edited, Intraprocedure Meds edited

## 2020-06-13 ENCOUNTER — Other Ambulatory Visit (HOSPITAL_BASED_OUTPATIENT_CLINIC_OR_DEPARTMENT_OTHER): Payer: Self-pay

## 2020-06-13 LAB — SURGICAL PATHOLOGY

## 2020-06-17 ENCOUNTER — Encounter: Payer: Self-pay | Admitting: *Deleted

## 2020-06-17 NOTE — Progress Notes (Signed)
Oncology Nurse Navigator Documentation  Oncology Nurse Navigator Flowsheets 06/17/2020  Abnormal Finding Date -  Confirmed Diagnosis Date -  Diagnosis Status -  Planned Course of Treatment -  Phase of Treatment Surgery  Expected Surgery Date -  Surgery Actual Start Date: 06/11/2020  Navigator Follow Up Date: 06/30/2020  Navigator Follow Up Reason: New Patient Appointment  Navigator Location [facility]  Referral Date to RadOnc/MedOnc -  Navigator Encounter Type Pathology Review  Patient Visit Type MedOnc  Treatment Phase Active Tx  Barriers/Navigation Needs Coordination of Care;Education  Education -  Interventions None Required  Acuity Level 2-Minimal Needs (1-2 Barriers Identified)  Coordination of Care -  Education Method -  Support Groups/Services -  Time Spent with Patient 15

## 2020-06-18 ENCOUNTER — Other Ambulatory Visit (HOSPITAL_BASED_OUTPATIENT_CLINIC_OR_DEPARTMENT_OTHER): Payer: Self-pay

## 2020-06-23 ENCOUNTER — Other Ambulatory Visit: Payer: Self-pay | Admitting: Family Medicine

## 2020-06-23 ENCOUNTER — Encounter: Payer: Self-pay | Admitting: *Deleted

## 2020-06-23 ENCOUNTER — Other Ambulatory Visit (HOSPITAL_BASED_OUTPATIENT_CLINIC_OR_DEPARTMENT_OTHER): Payer: Self-pay

## 2020-06-23 DIAGNOSIS — IMO0002 Reserved for concepts with insufficient information to code with codable children: Secondary | ICD-10-CM

## 2020-06-23 DIAGNOSIS — E1165 Type 2 diabetes mellitus with hyperglycemia: Secondary | ICD-10-CM

## 2020-06-23 DIAGNOSIS — Z170 Estrogen receptor positive status [ER+]: Secondary | ICD-10-CM

## 2020-06-23 MED ORDER — METFORMIN HCL ER 500 MG PO TB24
500.0000 mg | ORAL_TABLET | Freq: Two times a day (BID) | ORAL | 1 refills | Status: DC
  Filled 2020-06-23: qty 180, 90d supply, fill #0
  Filled 2020-09-29: qty 180, 90d supply, fill #1

## 2020-06-23 MED FILL — Budesonide-Formoterol Fumarate Dihyd Aerosol 80-4.5 MCG/ACT: RESPIRATORY_TRACT | 30 days supply | Qty: 10.2 | Fill #1 | Status: AC

## 2020-06-23 NOTE — Progress Notes (Signed)
Patient called for clarification regarding her new patient appointment. Reviewed visitor policy, lab procedure and purpose of visit. When discussing labs, she mentions she would be interested in having her hormone levels checked as she was on hormone replacement prior to diagnosis and she'd like to know where her levels are. This request has been given to Dr Marin Olp. Lab orders received and entered.   Currently there are no follow up appointments scheduled with RadOnc. Message sent.   Oncology Nurse Navigator Documentation  Oncology Nurse Navigator Flowsheets 06/23/2020  Abnormal Finding Date -  Confirmed Diagnosis Date -  Diagnosis Status -  Planned Course of Treatment -  Phase of Treatment -  Expected Surgery Date -  Surgery Actual Start Date: -  Navigator Follow Up Date: 06/30/2020  Navigator Follow Up Reason: New Patient Appointment  Navigator Location [facility]  Referral Date to RadOnc/MedOnc -  Navigator Encounter Type Appt/Treatment Plan Review;Telephone  Telephone Incoming Call  Patient Visit Type MedOnc  Treatment Phase Active Tx  Barriers/Navigation Needs Coordination of Care;Education  Education Other  Interventions Education;Psycho-Social Support;Coordination of Care  Acuity Level 2-Minimal Needs (1-2 Barriers Identified)  Coordination of Care Other  Education Method Verbal  Support Groups/Services Friends and Family  Time Spent with Patient 30

## 2020-06-25 ENCOUNTER — Other Ambulatory Visit (HOSPITAL_BASED_OUTPATIENT_CLINIC_OR_DEPARTMENT_OTHER): Payer: Self-pay

## 2020-06-30 ENCOUNTER — Other Ambulatory Visit: Payer: Self-pay

## 2020-06-30 ENCOUNTER — Inpatient Hospital Stay (HOSPITAL_BASED_OUTPATIENT_CLINIC_OR_DEPARTMENT_OTHER): Payer: PPO | Admitting: Hematology & Oncology

## 2020-06-30 ENCOUNTER — Encounter: Payer: Self-pay | Admitting: Hematology & Oncology

## 2020-06-30 ENCOUNTER — Inpatient Hospital Stay: Payer: PPO | Attending: Hematology & Oncology

## 2020-06-30 ENCOUNTER — Other Ambulatory Visit (HOSPITAL_BASED_OUTPATIENT_CLINIC_OR_DEPARTMENT_OTHER): Payer: Self-pay

## 2020-06-30 VITALS — BP 161/68 | HR 98 | Temp 97.9°F | Resp 20 | Ht 63.0 in | Wt 165.0 lb

## 2020-06-30 DIAGNOSIS — C50412 Malignant neoplasm of upper-outer quadrant of left female breast: Secondary | ICD-10-CM | POA: Insufficient documentation

## 2020-06-30 DIAGNOSIS — Z8371 Family history of colonic polyps: Secondary | ICD-10-CM

## 2020-06-30 DIAGNOSIS — Z170 Estrogen receptor positive status [ER+]: Secondary | ICD-10-CM

## 2020-06-30 DIAGNOSIS — Z7189 Other specified counseling: Secondary | ICD-10-CM

## 2020-06-30 DIAGNOSIS — M8580 Other specified disorders of bone density and structure, unspecified site: Secondary | ICD-10-CM | POA: Insufficient documentation

## 2020-06-30 DIAGNOSIS — Z8042 Family history of malignant neoplasm of prostate: Secondary | ICD-10-CM

## 2020-06-30 HISTORY — DX: Other specified counseling: Z71.89

## 2020-06-30 LAB — CMP (CANCER CENTER ONLY)
ALT: 17 U/L (ref 0–44)
AST: 17 U/L (ref 15–41)
Albumin: 4.7 g/dL (ref 3.5–5.0)
Alkaline Phosphatase: 43 U/L (ref 38–126)
Anion gap: 8 (ref 5–15)
BUN: 14 mg/dL (ref 8–23)
CO2: 33 mmol/L — ABNORMAL HIGH (ref 22–32)
Calcium: 10.6 mg/dL — ABNORMAL HIGH (ref 8.9–10.3)
Chloride: 96 mmol/L — ABNORMAL LOW (ref 98–111)
Creatinine: 0.78 mg/dL (ref 0.44–1.00)
GFR, Estimated: >60 mL/min (ref >60)
Glucose, Bld: 105 mg/dL — ABNORMAL HIGH (ref 70–99)
Potassium: 4.1 mmol/L (ref 3.5–5.1)
Sodium: 137 mmol/L (ref 135–145)
Total Bilirubin: 0.7 mg/dL (ref 0.3–1.2)
Total Protein: 6.9 g/dL (ref 6.5–8.1)

## 2020-06-30 LAB — CBC WITH DIFFERENTIAL (CANCER CENTER ONLY)
Abs Immature Granulocytes: 0.02 10*3/uL (ref 0.00–0.07)
Basophils Absolute: 0.1 10*3/uL (ref 0.0–0.1)
Basophils Relative: 1 %
Eosinophils Absolute: 1.2 10*3/uL — ABNORMAL HIGH (ref 0.0–0.5)
Eosinophils Relative: 15 %
HCT: 39.4 % (ref 36.0–46.0)
Hemoglobin: 13.3 g/dL (ref 12.0–15.0)
Immature Granulocytes: 0 %
Lymphocytes Relative: 37 %
Lymphs Abs: 2.8 10*3/uL (ref 0.7–4.0)
MCH: 30.2 pg (ref 26.0–34.0)
MCHC: 33.8 g/dL (ref 30.0–36.0)
MCV: 89.5 fL (ref 80.0–100.0)
Monocytes Absolute: 0.7 10*3/uL (ref 0.1–1.0)
Monocytes Relative: 9 %
Neutro Abs: 2.9 10*3/uL (ref 1.7–7.7)
Neutrophils Relative %: 38 %
Platelet Count: 394 10*3/uL (ref 150–400)
RBC: 4.4 MIL/uL (ref 3.87–5.11)
RDW: 12 % (ref 11.5–15.5)
WBC Count: 7.7 10*3/uL (ref 4.0–10.5)
nRBC: 0 % (ref 0.0–0.2)

## 2020-06-30 MED ORDER — LETROZOLE 2.5 MG PO TABS
2.5000 mg | ORAL_TABLET | Freq: Every day | ORAL | 3 refills | Status: DC
  Filled 2020-06-30: qty 60, 60d supply, fill #0
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
## 2020-07-01 ENCOUNTER — Encounter: Payer: Self-pay | Admitting: *Deleted

## 2020-07-01 ENCOUNTER — Telehealth: Payer: Self-pay

## 2020-07-01 ENCOUNTER — Other Ambulatory Visit (HOSPITAL_BASED_OUTPATIENT_CLINIC_OR_DEPARTMENT_OTHER): Payer: Self-pay

## 2020-07-01 LAB — ESTRADIOL: Estradiol: <5 pg/mL

## 2020-07-01 NOTE — Telephone Encounter (Signed)
Called and left a vm with appt per 06/30/20 los   Shravan Salahuddin 

## 2020-07-01 NOTE — Progress Notes (Signed)
Navigator was out of the office for New Patient Appointment.  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 Also gave patient MD and Navigator business card.   New patient packet given to patient which includes: orientation to office and staff; campus directory; education on My Chart and Advance Directives; and patient centered education on breast cancer including [employer] gift bag and binder.   Patient completed visit with Dr. Marin Olp.  Patient will need Oncotype Score testing. Request made on specimen [id number] DOS 06/11/20.   Patient will go onto radiation and once this is complete, recommendation is to begin AI.   Oncology Nurse Navigator Documentation  Oncology Nurse Navigator Flowsheets 07/01/2020  Abnormal Finding Date -  Confirmed Diagnosis Date -  Diagnosis Status -  Planned Course of Treatment Radiation;AI  Phase of Treatment Radiation  Expected Surgery Date -  Surgery Actual Start Date: -  Navigator Follow Up Date: 07/10/2020  Navigator Follow Up Reason: Radiation  Navigator Location [facility]  Referral Date to RadOnc/MedOnc -  Navigator Encounter Type Appt/Treatment Plan Review;Diagnostic Results  Telephone -  Patient Visit Type MedOnc  Treatment Phase Active Tx  Barriers/Navigation Needs Coordination of Care;Education  Education -  Interventions Coordination of Care  Acuity Level 2-Minimal Needs (1-2 Barriers Identified)  Coordination of Care Other  Education Method -  Support Groups/Services Friends and Family  Time Spent with Patient 39

## 2020-07-02 ENCOUNTER — Other Ambulatory Visit: Payer: Self-pay

## 2020-07-02 ENCOUNTER — Ambulatory Visit: Payer: PPO | Attending: General Surgery | Admitting: Physical Therapy

## 2020-07-02 DIAGNOSIS — Z483 Aftercare following surgery for neoplasm: Secondary | ICD-10-CM | POA: Insufficient documentation

## 2020-07-02 NOTE — Patient Instructions (Signed)
Over Head Pull: Narrow and Wide Grip   Cancer Rehab [phone]   On back, knees bent, feet flat, band across thighs, elbows straight but relaxed. Pull hands apart (start). Keeping elbows straight, bring arms up and over head, hands toward floor. Keep pull steady on band. Hold momentarily. Return slowly, keeping pull steady, back to start. Then do same with a wider grip on the band (past shoulder width) Repeat _5-10__ times. Band color __yellow____   Side Pull: Double Arm   On back, knees bent, feet flat. Arms perpendicular to body, shoulder level, elbows straight but relaxed. Pull arms out to sides, elbows straight. Resistance band comes across collarbones, hands toward floor. Hold momentarily. Slowly return to starting position. Repeat _5-10__ times. Band color _yellow____   Sword   On back, knees bent, feet flat, left hand on left hip, right hand above left. Pull right arm DIAGONALLY (hip to shoulder) across chest. Bring right arm along head toward floor. Hold momentarily. Slowly return to starting position. Repeat _5-10__ times. Do with left arm. Band color _yellow_____   Shoulder Rotation: Double Arm   On back, knees bent, feet flat, elbows tucked at sides, bent 90, hands palms up. Pull hands apart and down toward floor, keeping elbows near sides. Hold momentarily. Slowly return to starting position. Repeat _5-10__ times. Band color __yellow____    [email or web address] Courses Online Strength After Breast Cancer Look at the right of the page for Lymphedema Education Session  
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
## 2020-07-07 ENCOUNTER — Other Ambulatory Visit: Payer: PPO

## 2020-07-07 ENCOUNTER — Ambulatory Visit: Payer: PPO | Admitting: Hematology & Oncology

## 2020-07-10 ENCOUNTER — Ambulatory Visit
Admission: RE | Admit: 2020-07-10 | Discharge: 2020-07-10 | Disposition: A | Discharge status: Home / Self Care | Payer: PPO | Source: Ambulatory Visit | Attending: Radiation Oncology | Admitting: Radiation Oncology

## 2020-07-10 ENCOUNTER — Encounter: Payer: Self-pay | Admitting: Radiation Oncology

## 2020-07-10 ENCOUNTER — Encounter: Payer: Self-pay | Admitting: *Deleted

## 2020-07-10 ENCOUNTER — Other Ambulatory Visit: Payer: Self-pay

## 2020-07-10 VITALS — BP 174/70 | HR 104 | Temp 97.6°F | Resp 18 | Ht 63.5 in | Wt 164.4 lb

## 2020-07-10 DIAGNOSIS — Z79899 Other long term (current) drug therapy: Secondary | ICD-10-CM | POA: Insufficient documentation

## 2020-07-10 DIAGNOSIS — Z7984 Long term (current) use of oral hypoglycemic drugs: Secondary | ICD-10-CM | POA: Insufficient documentation

## 2020-07-10 DIAGNOSIS — I10 Essential (primary) hypertension: Secondary | ICD-10-CM | POA: Diagnosis not present

## 2020-07-10 DIAGNOSIS — G629 Polyneuropathy, unspecified: Secondary | ICD-10-CM | POA: Diagnosis not present

## 2020-07-10 DIAGNOSIS — E119 Type 2 diabetes mellitus without complications: Secondary | ICD-10-CM | POA: Diagnosis not present

## 2020-07-10 DIAGNOSIS — E785 Hyperlipidemia, unspecified: Secondary | ICD-10-CM | POA: Diagnosis not present

## 2020-07-10 DIAGNOSIS — E079 Disorder of thyroid, unspecified: Secondary | ICD-10-CM | POA: Insufficient documentation

## 2020-07-10 DIAGNOSIS — C50412 Malignant neoplasm of upper-outer quadrant of left female breast: Secondary | ICD-10-CM | POA: Diagnosis not present

## 2020-07-10 DIAGNOSIS — Z170 Estrogen receptor positive status [ER+]: Secondary | ICD-10-CM | POA: Diagnosis not present

## 2020-07-10 DIAGNOSIS — E039 Hypothyroidism, unspecified: Secondary | ICD-10-CM | POA: Insufficient documentation

## 2020-07-10 DIAGNOSIS — Z87442 Personal history of urinary calculi: Secondary | ICD-10-CM | POA: Diagnosis not present

## 2020-07-10 DIAGNOSIS — J449 Chronic obstructive pulmonary disease, unspecified: Secondary | ICD-10-CM | POA: Diagnosis not present

## 2020-07-10 NOTE — Addendum Note (Signed)
Encounter addended by: Hayden Pedro, PA-C on: 07/10/2020 9:59 PM  Actions taken: Clinical Note Signed

## 2020-07-10 NOTE — Progress Notes (Signed)
Patients blood pressure was elevated denies nay s/s of hypertension. Vitals:   07/10/20 0811  BP: (!) 174/70  Pulse: (!) 104  Resp: 18  Temp: 97.6 F (36.4 C)  SpO2: 100%  Weight: 74.6 kg  Height: 5' 3.5" (1.613 m)

## 2020-07-10 NOTE — Addendum Note (Signed)
Encounter addended by: Hayden Pedro, PA-C on: 07/10/2020 9:52 PM  Actions taken: Clinical Note Signed

## 2020-07-10 NOTE — Progress Notes (Addendum)
Radiation Oncology         [phone] ________________________________  Name: Michelle [name]        MRN: [id number]  Date of Service: 07/10/2020 DOB: 1955-12-08  GU:RKYHC Chase, Alferd Apa, DO  Ennever, Rudell Cobb, MD     REFERRING PHYSICIAN: Volanda Napoleon, MD   DIAGNOSIS: The encounter diagnosis was Malignant neoplasm of upper-outer quadrant of left breast in female, estrogen receptor positive ([place]).   HISTORY OF PRESENT ILLNESS: Michelle [name] is a 65 y.o. female with a left breast cancer. She was found by screening to have a mass in the left breast.  Further work-up revealed a 5 mm area in the left breast at approximately 12 o'clock position of concern.  There was also a 1.3 cm lesion in the 3 o'clock position, her left axilla was negative for adenopathy.  Biopsies on 05/05/2020 showed benign findings in the 3:00 lesion and were felt to be concordant.  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  The tumor was ER/PR positive, HER-2 was negative and Ki-67 was 2%.    Since her last visit, the patient underwent  left lumpectomy with sentinel lymph node biopsy which was performed on 06/11/2020.  Final pathology revealed a 1 cm area of invasive and in situ carcinoma within the ductal system Pap.  Her margins were clear, and 3 sampled lymph nodes were negative.  Additional lateral superior and medial excision specimens were also benign.  She had Oncotype scoring ordered, the results are pending. She's seen today to discuss treatment of her cancer.   PREVIOUS RADIATION THERAPY: No   PAST MEDICAL HISTORY:  Past Medical History:  Diagnosis Date  . Allergy   . Asthma   . Bronchitis   . Cancer ([place])   . COPD (chronic obstructive pulmonary disease) (HCC)    mild  . Diabetes mellitus without complication ([place])   . [place] Patient infection   . Goals of care, counseling/discussion 06/30/2020  . History of hiatal hernia    . History of kidney stones   . Hyperlipidemia   . Hypertension    stress induced/ work makes it worse.  Marland Kitchen Hypothyroidism   . Neuromuscular disorder (HCC)    Neuropathy  . Obesity   . Pneumonia   . Thyroid disease        PAST SURGICAL HISTORY: Past Surgical History:  Procedure Laterality Date  . BREAST LUMPECTOMY WITH RADIOACTIVE SEED AND SENTINEL LYMPH NODE BIOPSY Left 06/11/2020   Procedure: LEFT BREAST LUMPECTOMY WITH RADIOACTIVE SEED AND LEFT AXILLARY SENTINEL LYMPH NODE BIOPSY;  Surgeon: Rolm Bookbinder, MD;  Location: [facility];  Service: General;  Laterality: Left;  . TONSILLECTOMY AND ADENOIDECTOMY  childhood  . TUBAL LIGATION    . VAGINAL HYSTERECTOMY  2011     FAMILY HISTORY:  Family History  Problem Relation Age of Onset  . Heart disease Mother   . Hyperlipidemia Mother   . Stroke Mother   . Hypertension Mother   . Heart disease Father   . Colon polyps Brother   . Prostate cancer Brother   . Colon cancer Neg Hx   . Stomach cancer Neg Hx   . Rectal cancer Neg Hx   . Esophageal cancer Neg Hx      SOCIAL HISTORY:  reports that she has never smoked. She has never used smokeless tobacco. She reports that she does not drink alcohol and does not use drugs.  The patient is married and lives in [place]. She is a retired Marine scientist.   ALLERGIES: Influenza vaccine live, Shrimp [shellfish allergy], Tdap [tetanus-diphth-acell pertussis], Demerol [meperidine], Peanut-containing drug products, Chlorhexidine gluconate [chlorhexidine], and Latex   MEDICATIONS:  Current Outpatient Medications  Medication Sig Dispense Refill  . acetaminophen (TYLENOL) 500 MG tablet Take 1,000 mg by mouth 2 (two) times daily. (Patient not taking: Reported on 06/30/2020)    . albuterol (VENTOLIN HFA) 108 (90 Base) MCG/ACT inhaler INHALE 1 PUFF BY MOUTH INTO LUNGS EVERY 8 HOURS AS NEEDED FOR WHEEZING OR SHORTNESS OF BREATH (Patient not taking: Reported on 06/30/2020) 8.5 g 1  . Alpha-Lipoic Acid 600  MG TABS Take 600 mg by mouth daily.    Marland Kitchen amLODipine (NORVASC) 5 MG tablet TAKE 1 TABLET (5 MG TOTAL) BY MOUTH DAILY. 90 tablet 1  . Ascorbic Acid (VITAMIN C) 1000 MG tablet Take 1,000 mg by mouth 2 (two) times daily.    . budesonide-formoterol (SYMBICORT) 80-4.5 MCG/ACT inhaler INHALE 2 PUFFS BY MOUTH INTO THE LUNGS IN THE MORNING AND AT BEDTIME. (Patient taking differently: Inhale 1-2 puffs into the lungs 2 (two) times daily.) 10.2 g 5  . Calcium-Magnesium (CAL-MAG PO) Take 1 tablet by mouth 3 (three) times daily.    . Cholecalciferol (DIALYVITE VITAMIN D 5000) 125 MCG (5000 UT) capsule Take 5,000 Units by mouth daily.    . CHROMIUM PO Take 2 tablets by mouth 2 (two) times daily.    Marland Kitchen CINNAMON PO Take 125 mg by mouth 3 (three) times daily.    Marland Kitchen co-enzyme Q-10 50 MG capsule Take 100 mg by mouth daily.    . cyclobenzaprine (FLEXERIL) 10 MG tablet Take 1 tablet (10 mg total) by mouth 3 (three) times daily as needed for muscle spasms. (Patient not taking: Reported on 06/30/2020) 30 tablet 0  . Digestive Enzyme CAPS Take 1 capsule by mouth 3 (three) times daily with meals.    . fluticasone (FLONASE) 50 MCG/ACT nasal spray Place 1 spray into both nostrils 2 (two) times daily.    Marland Kitchen GARLIC PO Take 1 tablet by mouth 3 (three) times daily.    . hydrochlorothiazide (HYDRODIURIL) 25 MG tablet TAKE 1 TABLET (25 MG TOTAL) BY MOUTH DAILY. 90 tablet 1  . letrozole (FEMARA) 2.5 MG tablet Take 1 tablet (2.5 mg total) by mouth daily. 60 tablet 3  . Levomefolic Acid (5-MTHF PO) Take 1,000 mcg by mouth daily.    Marland Kitchen loratadine (CLARITIN) 10 MG tablet Take 10 mg by mouth at bedtime.    . Melatonin 10 MG CAPS Take 10 mg by mouth at bedtime.    . Menaquinone-7 (VITAMIN K2 PO) Take 1 tablet by mouth daily.    . metFORMIN (GLUCOPHAGE-XR) 500 MG 24 hr tablet Take 1 tablet (500 mg total) by mouth 2 (two) times daily. 180 tablet 1  . Methylcobalamin 50000 MCG SOLR Inject 5,000 mcg as directed 2 (two) times a week. Tuesdays and  Saturdays    . MULTIPLE VITAMIN PO Take 2 capsules by mouth in the morning, at noon, and at bedtime.    Marland Kitchen NALTREXONE HCL PO Take 3 mg by mouth at bedtime. Natrexone er 1.5 mg capsules x 2    . Omega-3 Fatty Acids (THE VERY FINEST FISH OIL) LIQD Take 15 mLs by mouth daily.    Marland Kitchen POTASSIUM IODIDE PO Take 225 mcg by mouth 3 (three) times daily.    . Probiotic Product (PROBIOTIC PO) Take 1 capsule by mouth at bedtime.    Marland Kitchen  QUERCETIN PO Take 1 tablet by mouth 3 (three) times daily.    . simvastatin (ZOCOR) 20 MG tablet TAKE 1 TABLET (20 MG TOTAL) BY MOUTH DAILY AT 6 PM. (Patient taking differently: Take 20 mg by mouth at bedtime.) 90 tablet 1  . thyroid (ARMOUR) 90 MG tablet Take 45 mg by mouth daily.    . traMADol (ULTRAM) 50 MG tablet Take 2 tablets (100 mg total) by mouth every 6 (six) hours as needed. (Patient not taking: Reported on 06/30/2020) 10 tablet 0  . TURMERIC PO Take 1,500 mg by mouth 2 (two) times daily.    Marland Kitchen VITAMIN A PO Take 20,000 Units by mouth daily.    Marland Kitchen VITAMIN E PO Take 260 mg by mouth daily.    . Zinc 30 MG CAPS Take 30 mg by mouth 2 (two) times daily.     No current facility-administered medications for this encounter.     REVIEW OF SYSTEMS: On review of systems, the patient reports she is doing well overall. She has concerns about radiation exposures and is interested in using Lugol's solution on her skin and was encouraged to use this by her integrative physician. She feels that her skin is healing well otherwise.    PHYSICAL EXAM:  Wt Readings from Last 3 Encounters:  07/10/20 164 lb 6.4 oz (74.6 kg)  06/30/20 165 lb (74.8 kg)  06/11/20 162 lb (73.5 kg)   Temp Readings from Last 3 Encounters:  07/10/20 97.6 F (36.4 C)  06/30/20 97.9 F (36.6 C) (Oral)  06/11/20 98 F (36.7 C)   BP Readings from Last 3 Encounters:  07/10/20 (!) 174/70  06/30/20 (!) 161/68  06/11/20 (!) 156/63   Pulse Readings from Last 3 Encounters:  07/10/20 (!) 104  06/30/20 98   06/11/20 83    In general this is a well appearing caucasian female in no acute distress. She's alert and oriented x4 and appropriate throughout the examination. Cardiopulmonary assessment is negative for acute distress and she exhibits normal effort.  The left breast incision appears to be well-healed without erythema separation or fluctuance.  ECOG = 0  0 - Asymptomatic (Fully active, able to carry on all predisease activities without restriction)  1 - Symptomatic but completely ambulatory (Restricted in physically strenuous activity but ambulatory and able to carry out work of a light or sedentary nature. For example, light housework, office work)  2 - Symptomatic, <50% in bed during the day (Ambulatory and capable of all self care but unable to carry out any work activities. Up and about more than 50% of waking hours)  3 - Symptomatic, >50% in bed, but not bedbound (Capable of only limited self-care, confined to bed or chair 50% or more of waking hours)  4 - Bedbound (Completely disabled. Cannot carry on any self-care. Totally confined to bed or chair)  5 - Death   Eustace Pen MM, Creech RH, Tormey DC, et al. [id number]). "Toxicity and response criteria of the [facility]". [place] Oncol. 5 (6): 649-55    LABORATORY DATA:  Lab Results  Component Value Date   WBC 7.7 06/30/2020   HGB 13.3 06/30/2020   HCT 39.4 06/30/2020   MCV 89.5 06/30/2020   PLT 394 06/30/2020   Lab Results  Component Value Date   NA 137 06/30/2020   K 4.1 06/30/2020   CL 96 (L) 06/30/2020   CO2 33 (H) 06/30/2020   Lab Results  Component Value Date   ALT 17  06/30/2020   AST 17 06/30/2020   ALKPHOS 43 06/30/2020   BILITOT 0.7 06/30/2020      RADIOGRAPHY: NM Sentinel Node Inj-No Rpt (Breast)  Result Date: 06/11/2020 Sulfur colloid was injected by the nuclear medicine technologist for melanoma sentinel node.   MM Breast Surgical Specimen  Result Date: 06/11/2020 CLINICAL  DATA:  Patient is post surgical excision left breast. EXAM: SPECIMEN RADIOGRAPH OF THE LEFT BREAST COMPARISON:  Previous exam(s). FINDINGS: Status post excision of the left breast. The radioactive seed and ribbon shaped biopsy marker clip are present, completely intact, and were marked for pathology. Results called to the OR at the time of dictation. IMPRESSION: Specimen radiograph of the left breast. Electronically Signed   By: Marin Olp M.D.   On: 06/11/2020 09:27   [place] LT RADIOACTIVE SEED LOC  Result Date: 06/10/2020 CLINICAL DATA:  65 year old female presenting for radioactive seed localization of a left breast mass at 12 o'clock. EXAM: ULTRASOUND GUIDED RADIOACTIVE SEED LOCALIZATION OF THE LEFT BREAST COMPARISON:  Previous exam(s). FINDINGS: Patient presents for radioactive seed localization prior to left breast lumpectomy. I met with the patient and we discussed the procedure of seed localization including benefits and alternatives. We discussed the high likelihood of a successful procedure. We discussed the risks of the procedure including infection, bleeding, tissue injury and further surgery. We discussed the low dose of radioactivity involved in the procedure. Informed, written consent was given. The usual time-out protocol was performed immediately prior to the procedure. Using ultrasound guidance, sterile technique, 1% lidocaine and an I-125 radioactive seed, the mass in the left breast at 12 o'clock was localized using a lateral approach. The follow-up mammogram images confirm the seed in the expected location and were marked for Dr. Donne Hazel. Follow-up survey of the patient confirms presence of the radioactive seed. Order number of I-125 seed:  [id number]. Total activity:  1.601 millicuries reference Date: 06/04/2020 The patient tolerated the procedure well and was released from the [place]. She was given instructions regarding seed removal. IMPRESSION: Radioactive seed localization left  breast. No apparent complications. Electronically Signed   By: Ammie Ferrier M.D.   On: 06/10/2020 16:12   MM CLIP PLACEMENT LEFT  Result Date: 06/10/2020 CLINICAL DATA:  Mammogram status post seed placement in the left breast. EXAM: DIAGNOSTIC LEFT MAMMOGRAM POST ULTRASOUND-GUIDED RADIOACTIVE SEED PLACEMENT COMPARISON:  Previous exam(s). FINDINGS: Mammographic images were obtained following ultrasound-guided radioactive seed placement. These demonstrate the radioactive seed is positioned immediately adjacent to the ribbon shaped biopsy marking clip at the site of biopsy proven cancer. IMPRESSION: Appropriate location of the radioactive seed. Final Assessment: Post Procedure Mammograms for Seed Placement Electronically Signed   By: Ammie Ferrier M.D.   On: 06/10/2020 16:00       IMPRESSION/PLAN: 1. Stage IA, [id number] grade 1, ER/PR positive invasive ductal carcinoma of the left breast. Dr. Lisbeth Renshaw reviews the final pathology findings and reviews the nature of early stage breast disease.  The patient did have Oncotype scoring performed, the results of this however are not available today. The patient states she would not likely accept chemotherapy even if it was offered. Dr. Lisbeth Renshaw reviews the rationale for external radiotherapy to the breast  to reduce risks of local recurrence followed by antiestrogen therapy. We discussed the risks, benefits, short, and long term effects of radiotherapy, as well as the curative intent, and the patient is interested in proceeding. Dr. Lisbeth Renshaw discusses the delivery and logistics of radiotherapy and recommends 4 weeks of radiotherapy to  the left breast with deep inspiration breath hold technique. She is motivated to avoid the boost phase of the treatment out of her concerns for exposure of the heart, however Dr. Lisbeth Renshaw does discuss the rationale for the boost phase, and that final decisions will be made regarding the proximity of her surgical cavity to the heart following  her simulation. Written consent is obtained and placed in the chart, a copy was provided to the patient. She will simulate this morning and begin treatment in the next week to two weeks. Regarding skin care considerations, the use of Lugol's solution is not a standard practice for radiation skin care. Dr. Lisbeth Renshaw would recommend that she use Radiaplex gel during treatment that we will provide. She will consider this.  2. [place] Hypertension. The patient admits she always has high blood pressure in clinical settings. She reports she is asymptomatic without chest pain, headaches, visual or speech changes. She reports she normally runs within 980-221 systolic pressure and will check her pressure at home today. She is aware to call her PCP for further guidance if her pressures remain high or to seek care in an urgent setting ie the emergency department if she became symptomatic.   In a visit lasting 60 minutes, greater than 50% of the time was spent face to face reviewing her case, as well as in preparation of, discussing, and coordinating the patient's care.  The above documentation reflects my direct findings during this shared patient visit. Please see the separate note by Dr. Lisbeth Renshaw on this date for the remainder of the patient's plan of care.    Carola Rhine, [facility]    **Disclaimer: This note was dictated with voice recognition software. Similar sounding words can inadvertently be transcribed and this note may contain transcription errors which may not have been corrected upon publication of note.**

## 2020-07-10 NOTE — Progress Notes (Signed)
Oncology Nurse Navigator Documentation  Oncology Nurse Navigator Flowsheets 07/10/2020  Abnormal Finding Date -  Confirmed Diagnosis Date -  Diagnosis Status -  Planned Course of Treatment -  Phase of Treatment Radiation  Expected Surgery Date -  Surgery Actual Start Date: -  Navigator Follow Up Date: 07/22/2020  Navigator Follow Up Reason: Radiation  Navigator Location [facility]  Referral Date to RadOnc/MedOnc -  Navigator Encounter Type Appt/Treatment Plan Review;Diagnostic Results  Telephone -  Patient Visit Type MedOnc  Treatment Phase Active Tx  Barriers/Navigation Needs Coordination of Care;Education  Education -  Interventions None Required  Acuity Level 2-Minimal Needs (1-2 Barriers Identified)  Coordination of Care -  Education Method -  Support Groups/Services Friends and Family  Time Spent with Patient 15

## 2020-07-15 DIAGNOSIS — Z170 Estrogen receptor positive status [ER+]: Secondary | ICD-10-CM | POA: Diagnosis not present

## 2020-07-15 DIAGNOSIS — C50412 Malignant neoplasm of upper-outer quadrant of left female breast: Secondary | ICD-10-CM | POA: Diagnosis not present

## 2020-07-16 ENCOUNTER — Other Ambulatory Visit (HOSPITAL_BASED_OUTPATIENT_CLINIC_OR_DEPARTMENT_OTHER): Payer: Self-pay

## 2020-07-16 ENCOUNTER — Other Ambulatory Visit: Payer: Self-pay | Admitting: Hematology & Oncology

## 2020-07-17 ENCOUNTER — Encounter (HOSPITAL_COMMUNITY): Payer: Self-pay | Admitting: Hematology & Oncology

## 2020-07-18 DIAGNOSIS — C50412 Malignant neoplasm of upper-outer quadrant of left female breast: Secondary | ICD-10-CM | POA: Insufficient documentation

## 2020-07-18 DIAGNOSIS — Z170 Estrogen receptor positive status [ER+]: Secondary | ICD-10-CM | POA: Diagnosis not present

## 2020-07-22 ENCOUNTER — Encounter: Payer: Self-pay | Admitting: Family Medicine

## 2020-07-22 ENCOUNTER — Ambulatory Visit
Admission: RE | Admit: 2020-07-22 | Discharge: 2020-07-22 | Disposition: A | Discharge status: Home / Self Care | Payer: PPO | Source: Ambulatory Visit | Attending: Radiation Oncology | Admitting: Radiation Oncology

## 2020-07-22 ENCOUNTER — Encounter: Payer: Self-pay | Admitting: *Deleted

## 2020-07-22 ENCOUNTER — Other Ambulatory Visit: Payer: Self-pay

## 2020-07-22 DIAGNOSIS — Z170 Estrogen receptor positive status [ER+]: Secondary | ICD-10-CM | POA: Diagnosis not present

## 2020-07-22 DIAGNOSIS — C50412 Malignant neoplasm of upper-outer quadrant of left female breast: Secondary | ICD-10-CM | POA: Diagnosis not present

## 2020-07-22 NOTE — Progress Notes (Signed)
Oncology Nurse Navigator Documentation  Oncology Nurse Navigator Flowsheets 07/22/2020  Abnormal Finding Date -  Confirmed Diagnosis Date -  Diagnosis Status -  Planned Course of Treatment -  Phase of Treatment Radiation  Radiation Actual Start Date: 07/22/2020  Expected Surgery Date -  Surgery Actual Start Date: -  Navigator Follow Up Date: 08/12/2020  Navigator Follow Up Reason: Follow-up Appointment  Navigator Location [facility]  Referral Date to RadOnc/MedOnc -  Navigator Encounter Type Appt/Treatment Plan Review  Telephone -  Patient Visit Type MedOnc  Treatment Phase Active Tx  Barriers/Navigation Needs Coordination of Care;Education  Education -  Interventions None Required  Acuity Level 2-Minimal Needs (1-2 Barriers Identified)  Coordination of Care -  Education Method -  Support Groups/Services -  Time Spent with Patient 15

## 2020-07-23 ENCOUNTER — Other Ambulatory Visit: Payer: Self-pay

## 2020-07-23 ENCOUNTER — Ambulatory Visit
Admission: RE | Admit: 2020-07-23 | Discharge: 2020-07-23 | Disposition: A | Discharge status: Home / Self Care | Payer: PPO | Source: Ambulatory Visit | Attending: Radiation Oncology | Admitting: Radiation Oncology

## 2020-07-23 DIAGNOSIS — Z170 Estrogen receptor positive status [ER+]: Secondary | ICD-10-CM | POA: Diagnosis not present

## 2020-07-23 DIAGNOSIS — C50412 Malignant neoplasm of upper-outer quadrant of left female breast: Secondary | ICD-10-CM | POA: Diagnosis not present

## 2020-07-23 NOTE — Telephone Encounter (Signed)
Orders have been faxed

## 2020-07-24 ENCOUNTER — Ambulatory Visit
Admission: RE | Admit: 2020-07-24 | Discharge: 2020-07-24 | Disposition: A | Discharge status: Home / Self Care | Payer: PPO | Source: Ambulatory Visit | Attending: Radiation Oncology | Admitting: Radiation Oncology

## 2020-07-24 DIAGNOSIS — C50412 Malignant neoplasm of upper-outer quadrant of left female breast: Secondary | ICD-10-CM | POA: Diagnosis not present

## 2020-07-24 DIAGNOSIS — Z170 Estrogen receptor positive status [ER+]: Secondary | ICD-10-CM | POA: Diagnosis not present

## 2020-07-25 ENCOUNTER — Other Ambulatory Visit: Payer: Self-pay

## 2020-07-25 ENCOUNTER — Ambulatory Visit
Admission: RE | Admit: 2020-07-25 | Discharge: 2020-07-25 | Disposition: A | Discharge status: Home / Self Care | Payer: PPO | Source: Ambulatory Visit | Attending: Radiation Oncology | Admitting: Radiation Oncology

## 2020-07-25 DIAGNOSIS — C50412 Malignant neoplasm of upper-outer quadrant of left female breast: Secondary | ICD-10-CM

## 2020-07-25 DIAGNOSIS — Z170 Estrogen receptor positive status [ER+]: Secondary | ICD-10-CM | POA: Diagnosis not present

## 2020-07-25 MED ORDER — RADIAPLEXRX EX GEL
Freq: Once | CUTANEOUS | Status: AC
Start: 2020-07-25 — End: 2020-07-25

## 2020-07-25 NOTE — Progress Notes (Signed)

## 2020-07-28 ENCOUNTER — Ambulatory Visit
Admission: RE | Admit: 2020-07-28 | Discharge: 2020-07-28 | Disposition: A | Discharge status: Home / Self Care | Payer: PPO | Source: Ambulatory Visit | Attending: Radiation Oncology | Admitting: Radiation Oncology

## 2020-07-28 ENCOUNTER — Other Ambulatory Visit: Payer: Self-pay

## 2020-07-28 DIAGNOSIS — C50412 Malignant neoplasm of upper-outer quadrant of left female breast: Secondary | ICD-10-CM | POA: Diagnosis not present

## 2020-07-28 DIAGNOSIS — Z170 Estrogen receptor positive status [ER+]: Secondary | ICD-10-CM | POA: Diagnosis not present

## 2020-07-29 ENCOUNTER — Other Ambulatory Visit (HOSPITAL_BASED_OUTPATIENT_CLINIC_OR_DEPARTMENT_OTHER): Payer: Self-pay

## 2020-07-29 ENCOUNTER — Ambulatory Visit
Admission: RE | Admit: 2020-07-29 | Discharge: 2020-07-29 | Disposition: A | Discharge status: Home / Self Care | Payer: PPO | Source: Ambulatory Visit | Attending: Radiation Oncology | Admitting: Radiation Oncology

## 2020-07-29 DIAGNOSIS — Z170 Estrogen receptor positive status [ER+]: Secondary | ICD-10-CM | POA: Diagnosis not present

## 2020-07-29 DIAGNOSIS — C50412 Malignant neoplasm of upper-outer quadrant of left female breast: Secondary | ICD-10-CM | POA: Diagnosis not present

## 2020-07-30 ENCOUNTER — Ambulatory Visit
Admission: RE | Admit: 2020-07-30 | Discharge: 2020-07-30 | Disposition: A | Discharge status: Home / Self Care | Payer: PPO | Source: Ambulatory Visit | Attending: Radiation Oncology | Admitting: Radiation Oncology

## 2020-07-30 ENCOUNTER — Other Ambulatory Visit: Payer: Self-pay

## 2020-07-30 DIAGNOSIS — Z170 Estrogen receptor positive status [ER+]: Secondary | ICD-10-CM | POA: Diagnosis not present

## 2020-07-30 DIAGNOSIS — C50412 Malignant neoplasm of upper-outer quadrant of left female breast: Secondary | ICD-10-CM | POA: Diagnosis not present

## 2020-07-31 ENCOUNTER — Ambulatory Visit
Admission: RE | Admit: 2020-07-31 | Discharge: 2020-07-31 | Disposition: A | Discharge status: Home / Self Care | Payer: PPO | Source: Ambulatory Visit | Attending: Radiation Oncology | Admitting: Radiation Oncology

## 2020-07-31 DIAGNOSIS — C50412 Malignant neoplasm of upper-outer quadrant of left female breast: Secondary | ICD-10-CM | POA: Diagnosis not present

## 2020-07-31 DIAGNOSIS — Z170 Estrogen receptor positive status [ER+]: Secondary | ICD-10-CM | POA: Diagnosis not present

## 2020-08-01 ENCOUNTER — Ambulatory Visit
Admission: RE | Admit: 2020-08-01 | Discharge: 2020-08-01 | Disposition: A | Discharge status: Home / Self Care | Payer: PPO | Source: Ambulatory Visit | Attending: Radiation Oncology | Admitting: Radiation Oncology

## 2020-08-01 ENCOUNTER — Other Ambulatory Visit: Payer: Self-pay

## 2020-08-01 DIAGNOSIS — Z170 Estrogen receptor positive status [ER+]: Secondary | ICD-10-CM | POA: Diagnosis not present

## 2020-08-01 DIAGNOSIS — C50412 Malignant neoplasm of upper-outer quadrant of left female breast: Secondary | ICD-10-CM | POA: Diagnosis not present

## 2020-08-04 ENCOUNTER — Other Ambulatory Visit: Payer: Self-pay

## 2020-08-04 ENCOUNTER — Ambulatory Visit
Admission: RE | Admit: 2020-08-04 | Discharge: 2020-08-04 | Disposition: A | Discharge status: Home / Self Care | Payer: PPO | Source: Ambulatory Visit | Attending: Radiation Oncology | Admitting: Radiation Oncology

## 2020-08-04 DIAGNOSIS — C50412 Malignant neoplasm of upper-outer quadrant of left female breast: Secondary | ICD-10-CM | POA: Diagnosis not present

## 2020-08-04 DIAGNOSIS — Z170 Estrogen receptor positive status [ER+]: Secondary | ICD-10-CM | POA: Diagnosis not present

## 2020-08-05 ENCOUNTER — Encounter: Payer: Self-pay | Admitting: Radiation Oncology

## 2020-08-05 ENCOUNTER — Ambulatory Visit
Admission: RE | Admit: 2020-08-05 | Discharge: 2020-08-05 | Disposition: A | Discharge status: Home / Self Care | Payer: PPO | Source: Ambulatory Visit | Attending: Radiation Oncology | Admitting: Radiation Oncology

## 2020-08-05 DIAGNOSIS — C50412 Malignant neoplasm of upper-outer quadrant of left female breast: Secondary | ICD-10-CM | POA: Diagnosis not present

## 2020-08-05 DIAGNOSIS — Z170 Estrogen receptor positive status [ER+]: Secondary | ICD-10-CM | POA: Diagnosis not present

## 2020-08-06 ENCOUNTER — Ambulatory Visit
Admission: RE | Admit: 2020-08-06 | Discharge: 2020-08-06 | Disposition: A | Discharge status: Home / Self Care | Payer: PPO | Source: Ambulatory Visit | Attending: Radiation Oncology | Admitting: Radiation Oncology

## 2020-08-06 ENCOUNTER — Other Ambulatory Visit: Payer: Self-pay

## 2020-08-06 DIAGNOSIS — Z170 Estrogen receptor positive status [ER+]: Secondary | ICD-10-CM | POA: Diagnosis not present

## 2020-08-06 DIAGNOSIS — C50912 Malignant neoplasm of unspecified site of left female breast: Secondary | ICD-10-CM | POA: Diagnosis not present

## 2020-08-06 DIAGNOSIS — Z853 Personal history of malignant neoplasm of breast: Secondary | ICD-10-CM | POA: Diagnosis not present

## 2020-08-06 DIAGNOSIS — C50412 Malignant neoplasm of upper-outer quadrant of left female breast: Secondary | ICD-10-CM | POA: Diagnosis not present

## 2020-08-07 ENCOUNTER — Ambulatory Visit
Admission: RE | Admit: 2020-08-07 | Discharge: 2020-08-07 | Disposition: A | Discharge status: Home / Self Care | Payer: PPO | Source: Ambulatory Visit | Attending: Radiation Oncology | Admitting: Radiation Oncology

## 2020-08-07 DIAGNOSIS — C50412 Malignant neoplasm of upper-outer quadrant of left female breast: Secondary | ICD-10-CM | POA: Diagnosis not present

## 2020-08-07 DIAGNOSIS — Z170 Estrogen receptor positive status [ER+]: Secondary | ICD-10-CM | POA: Diagnosis not present

## 2020-08-08 ENCOUNTER — Telehealth: Payer: Self-pay | Admitting: *Deleted

## 2020-08-08 ENCOUNTER — Other Ambulatory Visit: Payer: Self-pay

## 2020-08-08 ENCOUNTER — Ambulatory Visit: Payer: PPO | Admitting: Radiation Oncology

## 2020-08-08 ENCOUNTER — Ambulatory Visit
Admission: RE | Admit: 2020-08-08 | Discharge: 2020-08-08 | Disposition: A | Discharge status: Home / Self Care | Payer: PPO | Source: Ambulatory Visit | Attending: Radiation Oncology | Admitting: Radiation Oncology

## 2020-08-08 DIAGNOSIS — C50412 Malignant neoplasm of upper-outer quadrant of left female breast: Secondary | ICD-10-CM | POA: Diagnosis not present

## 2020-08-08 DIAGNOSIS — Z170 Estrogen receptor positive status [ER+]: Secondary | ICD-10-CM | POA: Diagnosis not present

## 2020-08-08 MED ORDER — RADIAPLEXRX EX GEL: Freq: Once | CUTANEOUS | Status: AC

## 2020-08-08 NOTE — Telephone Encounter (Signed)
Fax received for Dr. Marin Olp to sign for patient to be approved for massage.  Form signed and faxed back to [place] at [phone].  Call placed to patient and message left to inform her that form has been signed and faxed back with OK to receive massages at [facility].

## 2020-08-11 ENCOUNTER — Ambulatory Visit
Admission: RE | Admit: 2020-08-11 | Discharge: 2020-08-11 | Disposition: A | Discharge status: Home / Self Care | Payer: PPO | Source: Ambulatory Visit | Attending: Radiation Oncology | Admitting: Radiation Oncology

## 2020-08-11 ENCOUNTER — Other Ambulatory Visit: Payer: Self-pay

## 2020-08-11 DIAGNOSIS — Z170 Estrogen receptor positive status [ER+]: Secondary | ICD-10-CM | POA: Diagnosis not present

## 2020-08-11 DIAGNOSIS — C50412 Malignant neoplasm of upper-outer quadrant of left female breast: Secondary | ICD-10-CM | POA: Diagnosis not present

## 2020-08-12 ENCOUNTER — Telehealth: Payer: Self-pay

## 2020-08-12 ENCOUNTER — Inpatient Hospital Stay: Payer: PPO | Admitting: Hematology & Oncology

## 2020-08-12 ENCOUNTER — Other Ambulatory Visit: Payer: Self-pay | Admitting: Family Medicine

## 2020-08-12 ENCOUNTER — Ambulatory Visit
Admission: RE | Admit: 2020-08-12 | Discharge: 2020-08-12 | Disposition: A | Discharge status: Home / Self Care | Payer: PPO | Source: Ambulatory Visit | Attending: Radiation Oncology | Admitting: Radiation Oncology

## 2020-08-12 ENCOUNTER — Encounter: Payer: Self-pay | Admitting: *Deleted

## 2020-08-12 ENCOUNTER — Inpatient Hospital Stay: Payer: PPO | Attending: Hematology & Oncology

## 2020-08-12 ENCOUNTER — Encounter: Payer: Self-pay | Admitting: Hematology & Oncology

## 2020-08-12 VITALS — BP 160/73 | HR 83 | Temp 98.1°F | Resp 18 | Wt 164.0 lb

## 2020-08-12 DIAGNOSIS — C50412 Malignant neoplasm of upper-outer quadrant of left female breast: Secondary | ICD-10-CM | POA: Diagnosis not present

## 2020-08-12 DIAGNOSIS — Z170 Estrogen receptor positive status [ER+]: Secondary | ICD-10-CM | POA: Diagnosis not present

## 2020-08-12 DIAGNOSIS — E1169 Type 2 diabetes mellitus with other specified complication: Secondary | ICD-10-CM | POA: Diagnosis not present

## 2020-08-12 DIAGNOSIS — Z79811 Long term (current) use of aromatase inhibitors: Secondary | ICD-10-CM | POA: Diagnosis not present

## 2020-08-12 DIAGNOSIS — E119 Type 2 diabetes mellitus without complications: Secondary | ICD-10-CM | POA: Diagnosis not present

## 2020-08-12 DIAGNOSIS — E785 Hyperlipidemia, unspecified: Secondary | ICD-10-CM | POA: Diagnosis not present

## 2020-08-12 LAB — CMP (CANCER CENTER ONLY)
ALT: 20 U/L (ref 0–44)
AST: 18 U/L (ref 15–41)
Albumin: 4.7 g/dL (ref 3.5–5.0)
Alkaline Phosphatase: 43 U/L (ref 38–126)
Anion gap: 9 (ref 5–15)
BUN: 14 mg/dL (ref 8–23)
CO2: 29 mmol/L (ref 22–32)
Calcium: 10.3 mg/dL (ref 8.9–10.3)
Chloride: 97 mmol/L — ABNORMAL LOW (ref 98–111)
Creatinine: 0.74 mg/dL (ref 0.44–1.00)
GFR, Estimated: >60 mL/min (ref >60)
Glucose, Bld: 101 mg/dL — ABNORMAL HIGH (ref 70–99)
Potassium: 4.1 mmol/L (ref 3.5–5.1)
Sodium: 135 mmol/L (ref 135–145)
Total Bilirubin: 0.5 mg/dL (ref 0.3–1.2)
Total Protein: 7.2 g/dL (ref 6.5–8.1)

## 2020-08-12 LAB — CBC WITH DIFFERENTIAL (CANCER CENTER ONLY)
Abs Immature Granulocytes: 0.01 10*3/uL (ref 0.00–0.07)
Basophils Absolute: 0.1 10*3/uL (ref 0.0–0.1)
Basophils Relative: 1 %
Eosinophils Absolute: 0.7 10*3/uL — ABNORMAL HIGH (ref 0.0–0.5)
Eosinophils Relative: 12 %
HCT: 39.7 % (ref 36.0–46.0)
Hemoglobin: 13.4 g/dL (ref 12.0–15.0)
Immature Granulocytes: 0 %
Lymphocytes Relative: 36 %
Lymphs Abs: 2 10*3/uL (ref 0.7–4.0)
MCH: 29.6 pg (ref 26.0–34.0)
MCHC: 33.8 g/dL (ref 30.0–36.0)
MCV: 87.8 fL (ref 80.0–100.0)
Monocytes Absolute: 0.7 10*3/uL (ref 0.1–1.0)
Monocytes Relative: 13 %
Neutro Abs: 2.1 10*3/uL (ref 1.7–7.7)
Neutrophils Relative %: 38 %
Platelet Count: 304 10*3/uL (ref 150–400)
RBC: 4.52 MIL/uL (ref 3.87–5.11)
RDW: 11.9 % (ref 11.5–15.5)
WBC Count: 5.5 10*3/uL (ref 4.0–10.5)
nRBC: 0 % (ref 0.0–0.2)

## 2020-08-12 NOTE — Telephone Encounter (Signed)
Patient went to Dr.Enevar's office this AM for blood work , bloodwork cant be processed until lab orders from [facility] are faxed over .Marland Kitchen   Fax number: [phone]

## 2020-08-12 NOTE — Telephone Encounter (Signed)
Appts made per 08/12/20 los   [employer]

## 2020-08-12 NOTE — Telephone Encounter (Signed)
Lab orders have been faxed to Dr. Marin Olp.

## 2020-08-13 ENCOUNTER — Other Ambulatory Visit: Payer: PPO

## 2020-08-13 ENCOUNTER — Ambulatory Visit: Payer: PPO

## 2020-08-13 ENCOUNTER — Ambulatory Visit
Admission: RE | Admit: 2020-08-13 | Discharge: 2020-08-13 | Disposition: A | Discharge status: Home / Self Care | Payer: PPO | Source: Ambulatory Visit | Attending: Radiation Oncology | Admitting: Radiation Oncology

## 2020-08-13 ENCOUNTER — Other Ambulatory Visit: Payer: Self-pay

## 2020-08-13 DIAGNOSIS — Z170 Estrogen receptor positive status [ER+]: Secondary | ICD-10-CM | POA: Diagnosis not present

## 2020-08-13 DIAGNOSIS — C50412 Malignant neoplasm of upper-outer quadrant of left female breast: Secondary | ICD-10-CM | POA: Diagnosis not present

## 2020-08-13 LAB — LIPID PANEL W/O CHOL/HDL RATIO
Cholesterol, Total: 171 mg/dL (ref 100–199)
HDL: 67 mg/dL (ref >39)
LDL Chol Calc (NIH): 91 mg/dL (ref 0–99)
Triglycerides: 71 mg/dL (ref 0–149)
VLDL Cholesterol Cal: 13 mg/dL (ref 5–40)

## 2020-08-13 LAB — FOLLICLE STIMULATING HORMONE: FSH: 66.1 m[IU]/mL

## 2020-08-13 LAB — MICROALBUMIN / CREATININE URINE RATIO
Creatinine, Urine: 13.9 mg/dL
Microalb/Creat Ratio: <22 mg/g creat (ref 0–29)
Microalbumin, Urine: <3 ug/mL

## 2020-08-13 LAB — HGB A1C W/O EAG: Hgb A1c MFr Bld: 5.6 % (ref 4.8–5.6)

## 2020-08-13 LAB — LUTEINIZING HORMONE: LH: 28.8 m[IU]/mL

## 2020-08-13 NOTE — Progress Notes (Signed)
Hematology and Oncology Follow Up Visit  Michelle [name] [id number] [birth year] 65 y.o. 08/13/2020   Principle Diagnosis:  Stage 1A (T1bN0M0) infiltrating ductal carcinoma of the LEFT breast- -ER positive/HER2 negative --Oncotype score of 17.  Current Therapy:   Status post left lumpectomy on 06/11/2020 Status post radiation therapy-completed on 08/19/2020 Femara 2.5 mg p.o. daily     Interim History:  Michelle [name] is back for follow-up.  We last saw her on May 16.  At that time, she started her radiation therapy.  She had an Oncotype score of 17.  So she did not require any chemotherapy.  She is doing well.  She received her booster doses of radiation therapy at this point time.  I think she has 4-5 booster doses.  We will go ahead and get her started on Femara once we complete the radiation.  I told her to start about 2 weeks after she finishes the radiation.  She is feeling okay.  She is tolerated radiation therapy fairly well.  Her appetite is doing okay.  She has had no problems with cough.  There is no change in bowel or bladder habits..  I do think she has some menopausal symptoms.  Her last estradiol level back in May was less than 5.  As such, I would think that she is postmenopausal.  She has had no problems with fever.  She does have some arthralgias.    Overall, I would say her performance status is ECOG 0.   Medications:  Current Outpatient Medications:    acetaminophen (TYLENOL) 500 MG tablet, Take 1,000 mg by mouth 2 (two) times daily., Disp: , Rfl:    Alpha-Lipoic Acid 600 MG TABS, Take 600 mg by mouth daily., Disp: , Rfl:    amLODipine (NORVASC) 5 MG tablet, TAKE 1 TABLET (5 MG TOTAL) BY MOUTH DAILY., Disp: 90 tablet, Rfl: 1   Ascorbic Acid (VITAMIN C) 1000 MG tablet, Take 1,000 mg by mouth 2 (two) times daily. (Patient not taking: Reported on 07/10/2020), Disp: , Rfl:    budesonide-formoterol (SYMBICORT) 80-4.5 MCG/ACT inhaler, INHALE 2 PUFFS BY MOUTH INTO THE  LUNGS IN THE MORNING AND AT BEDTIME. (Patient taking differently: Inhale 1-2 puffs into the lungs 2 (two) times daily.), Disp: 10.2 g, Rfl: 5   Calcium-Magnesium (CAL-MAG PO), Take 1 tablet by mouth 3 (three) times daily., Disp: , Rfl:    Cholecalciferol (DIALYVITE VITAMIN D 5000) 125 MCG (5000 UT) capsule, Take 5,000 Units by mouth daily., Disp: , Rfl:    CHROMIUM PO, Take 2 tablets by mouth 2 (two) times daily., Disp: , Rfl:    CINNAMON PO, Take 125 mg by mouth 3 (three) times daily., Disp: , Rfl:    co-enzyme Q-10 50 MG capsule, Take 100 mg by mouth daily., Disp: , Rfl:    cyclobenzaprine (FLEXERIL) 10 MG tablet, Take 1 tablet (10 mg total) by mouth 3 (three) times daily as needed for muscle spasms. (Patient not taking: No sig reported), Disp: 30 tablet, Rfl: 0   Digestive Enzyme CAPS, Take 1 capsule by mouth 3 (three) times daily with meals., Disp: , Rfl:    fluticasone (FLONASE) 50 MCG/ACT nasal spray, Place 1 spray into both nostrils 2 (two) times daily., Disp: , Rfl:    GARLIC PO, Take 1 tablet by mouth 3 (three) times daily., Disp: , Rfl:    hydrochlorothiazide (HYDRODIURIL) 25 MG tablet, TAKE 1 TABLET (25 MG TOTAL) BY MOUTH DAILY., Disp: 90 tablet, Rfl: 1   letrozole [facility])  2.5 MG tablet, Take 1 tablet (2.5 mg total) by mouth daily. (Patient not taking: Reported on 07/10/2020), Disp: 60 tablet, Rfl: 3   Levomefolic Acid (5-MTHF PO), Take 1,000 mcg by mouth daily., Disp: , Rfl:    loratadine (CLARITIN) 10 MG tablet, Take 10 mg by mouth at bedtime., Disp: , Rfl:    Melatonin 10 MG CAPS, Take 20 mg by mouth at bedtime., Disp: , Rfl:    Menaquinone-7 (VITAMIN K2 PO), Take 1 tablet by mouth daily., Disp: , Rfl:    metFORMIN (GLUCOPHAGE-XR) 500 MG 24 hr tablet, Take 1 tablet (500 mg total) by mouth 2 (two) times daily., Disp: 180 tablet, Rfl: 1   Methylcobalamin 50000 MCG SOLR, Inject 5,000 mcg as directed 2 (two) times a week. Tuesdays and Saturdays, Disp: , Rfl:    MULTIPLE VITAMIN PO, Take 2  capsules by mouth in the morning, at noon, and at bedtime., Disp: , Rfl:    NALTREXONE HCL PO, Take 3 mg by mouth at bedtime. Natrexone er 1.5 mg capsules x 2, Disp: , Rfl:    Omega-3 Fatty Acids (THE VERY FINEST FISH OIL) LIQD, Take 15 mLs by mouth daily., Disp: , Rfl:    POTASSIUM IODIDE PO, Take 225 mcg by mouth 3 (three) times daily., Disp: , Rfl:    Probiotic Product (PROBIOTIC PO), Take 1 capsule by mouth at bedtime., Disp: , Rfl:    QUERCETIN PO, Take 1 tablet by mouth 3 (three) times daily. (Patient not taking: Reported on 07/10/2020), Disp: , Rfl:    simvastatin (ZOCOR) 20 MG tablet, TAKE 1 TABLET (20 MG TOTAL) BY MOUTH DAILY AT 6 PM. (Patient taking differently: Take 20 mg by mouth at bedtime.), Disp: 90 tablet, Rfl: 1   thyroid (ARMOUR) 90 MG tablet, Take 45 mg by mouth daily., Disp: , Rfl:    traMADol (ULTRAM) 50 MG tablet, Take 2 tablets (100 mg total) by mouth every 6 (six) hours as needed. (Patient not taking: No sig reported), Disp: 10 tablet, Rfl: 0   TURMERIC PO, Take 1,500 mg by mouth 2 (two) times daily., Disp: , Rfl:    VITAMIN A PO, Take 20,000 Units by mouth daily., Disp: , Rfl:    VITAMIN E PO, Take 260 mg by mouth daily., Disp: , Rfl:    Zinc 30 MG CAPS, Take 30 mg by mouth 2 (two) times daily., Disp: , Rfl:   Allergies:  Allergies  Allergen Reactions   Influenza Vaccine Live Other (See Comments)    Sever flu symptoms, chest tightness, wheezing   Shrimp [Shellfish Allergy] Itching    Allergic to shrimp.  Wheezing and tightness in chest.   Tdap [Tetanus-Diphth-Acell Pertussis] Other (See Comments)    Change in level of consciousness, altered mental state   Demerol [Meperidine] Nausea And Vomiting   Peanut-Containing Drug Products Other (See Comments)    wheezing   Chlorhexidine Gluconate [Chlorhexidine] Rash    Patient states she is sensitive to CHG solution and prefers not to use it   Latex Rash    Past Medical History, Surgical history, Social history, and  Family History were reviewed and updated.  Review of Systems: Review of Systems  Constitutional: Negative.   HENT:  Negative.    Eyes: Negative.   Respiratory: Negative.    Cardiovascular: Negative.   Gastrointestinal: Negative.   Endocrine: Negative.   Genitourinary: Negative.    Musculoskeletal: Negative.   Skin: Negative.   Neurological: Negative.   Hematological: Negative.   Psychiatric/Behavioral: Negative.  Physical Exam:  weight is 164 lb (74.4 kg). Her oral temperature is 98.1 F (36.7 C). Her blood pressure is 160/73 (abnormal) and her pulse is 83. Her respiration is 18 and oxygen saturation is 100%.   Wt Readings from Last 3 Encounters:  08/12/20 164 lb (74.4 kg)  07/10/20 164 lb 6.4 oz (74.6 kg)  06/30/20 165 lb (74.8 kg)    Physical Exam Vitals reviewed.  Constitutional:      Comments: Her breast exam shows right breast with no masses, edema or erythema.  There is no right axillary adenopathy.  Left breast shows the lumpectomy that is well-healed.  There is at the edge of the areola at about the 1 o'clock position.  This is well-healed.  She has no breast masses.  There are some hyperpigmentation on the left breast from radiation.  The hyperpigmentation is mostly noted in the lower aspect of the left breast.  There is no left axillary adenopathy.  HENT:     Head: Normocephalic and atraumatic.  Eyes:     Pupils: Pupils are equal, round, and reactive to light.  Cardiovascular:     Rate and Rhythm: Normal rate and regular rhythm.     Heart sounds: Normal heart sounds.  Pulmonary:     Effort: Pulmonary effort is normal.     Breath sounds: Normal breath sounds.  Abdominal:     General: Bowel sounds are normal.     Palpations: Abdomen is soft.  Musculoskeletal:        General: No tenderness or deformity. Normal range of motion.     Cervical back: Normal range of motion.  Lymphadenopathy:     Cervical: No cervical adenopathy.  Skin:    General: Skin is warm  and dry.     Findings: No erythema or rash.  Neurological:     Mental Status: She is alert and oriented to person, place, and time.  Psychiatric:        Behavior: Behavior normal.        Thought Content: Thought content normal.        Judgment: Judgment normal.     Lab Results  Component Value Date   WBC 5.5 08/12/2020   HGB 13.4 08/12/2020   HCT 39.7 08/12/2020   MCV 87.8 08/12/2020   PLT 304 08/12/2020     Chemistry      Component Value Date/Time   NA 135 08/12/2020 0806   K 4.1 08/12/2020 0806   CL 97 (L) 08/12/2020 0806   CO2 29 08/12/2020 0806   BUN 14 08/12/2020 0806   CREATININE 0.74 08/12/2020 0806   CREATININE 0.74 11/06/2019 1113      Component Value Date/Time   CALCIUM 10.3 08/12/2020 0806   ALKPHOS 43 08/12/2020 0806   AST 18 08/12/2020 0806   ALT 20 08/12/2020 0806   BILITOT 0.5 08/12/2020 0806      Impression and Plan: Ms. Bouler is a very nice 65 year old postmenopausal white female.  She has a early stage-stage IA-ductal carcinoma of the left breast.  She underwent lumpectomy .  She had a low Oncotype score.  She will complete radiation next week.  I do not see any problems with her being on Femara.  I really do not think this is going on because any additional symptoms for her.  I know she is worried about some of the menopausal symptoms.  If menopausal symptoms become a real problem, we can get her on medication for this if needed.  I really think that the risk of recurrence for her is probably can be less than 10%.  I would like to get her back in about 2 months.  By then, she would have been on the Femara up for about 6 weeks and we can see how she feels.  I just feel confident that she will be cured of her breast cancer.   Volanda Napoleon, MD 6/29/20223:50 PM

## 2020-08-14 ENCOUNTER — Ambulatory Visit: Payer: PPO

## 2020-08-14 ENCOUNTER — Other Ambulatory Visit (HOSPITAL_BASED_OUTPATIENT_CLINIC_OR_DEPARTMENT_OTHER): Payer: Self-pay

## 2020-08-14 ENCOUNTER — Ambulatory Visit
Admission: RE | Admit: 2020-08-14 | Discharge: 2020-08-14 | Disposition: A | Discharge status: Home / Self Care | Payer: PPO | Source: Ambulatory Visit | Attending: Radiation Oncology | Admitting: Radiation Oncology

## 2020-08-14 ENCOUNTER — Encounter: Payer: Self-pay | Admitting: Radiation Oncology

## 2020-08-14 ENCOUNTER — Other Ambulatory Visit: Payer: Self-pay

## 2020-08-14 ENCOUNTER — Telehealth: Payer: Self-pay | Admitting: Radiation Oncology

## 2020-08-14 DIAGNOSIS — Z170 Estrogen receptor positive status [ER+]: Secondary | ICD-10-CM | POA: Diagnosis not present

## 2020-08-14 DIAGNOSIS — C50412 Malignant neoplasm of upper-outer quadrant of left female breast: Secondary | ICD-10-CM | POA: Diagnosis not present

## 2020-08-14 MED ORDER — METHYLPREDNISOLONE 4 MG PO TABS
ORAL_TABLET | ORAL | 0 refills | Status: DC
  Filled 2020-08-14: qty 21, 6d supply, fill #0

## 2020-08-14 NOTE — Telephone Encounter (Signed)
The pt called today after her first boost treatment that she received yesterday and about 3 hours afterwards she noted burning sensation and discomfort in her back and along the wall of her left ribcage. She reports she was able to get to sleep but woke up with a burning sensation when she would breath. She has a productive cough with mucous and has a history of chronic bronchitis/COPD and noted that it hurt to cough. She tried her Symbicort and this seemed to ease of her symptoms off but didn't note significant changes. She was able to use an incentive spirometer and her O2 sats were 98% at that time. When I called her at first I suggested she test for covid also since she's had symptoms of feeling achy. He had a recent pneumonia but states she's not had any fever. She is interested in coming in after I called her back to continue with her radiation. Her covid test was negative and her breathing rate improved after taking albuterol inhaler too. She reports she is willing to proceed with radiation today, and she requests a steroid dose pack which I d/w Dr. Tammi Klippel and he was in agreement with. We will see how she does with this course.

## 2020-08-15 ENCOUNTER — Ambulatory Visit: Payer: PPO

## 2020-08-15 ENCOUNTER — Ambulatory Visit
Admission: RE | Admit: 2020-08-15 | Discharge: 2020-08-15 | Disposition: A | Discharge status: Home / Self Care | Payer: PPO | Source: Ambulatory Visit | Attending: Radiation Oncology | Admitting: Radiation Oncology

## 2020-08-15 ENCOUNTER — Other Ambulatory Visit: Payer: Self-pay

## 2020-08-15 DIAGNOSIS — Z170 Estrogen receptor positive status [ER+]: Secondary | ICD-10-CM | POA: Diagnosis not present

## 2020-08-15 DIAGNOSIS — C50412 Malignant neoplasm of upper-outer quadrant of left female breast: Secondary | ICD-10-CM | POA: Insufficient documentation

## 2020-08-15 LAB — ESTRADIOL, ULTRA SENS: Estradiol, Sensitive: <2.5 pg/mL

## 2020-08-15 NOTE — Progress Notes (Signed)
The patient was seen in the treatment area prior to proceeding with radiotherapy to her left breast. As per prior notes, she has developed discomfort a few hours following her treatment on 08/13/20 that was her first boost and given her prior history of left sided pneumonia and COPD she was concerned about her lung. Her symptoms are noted as bilateral radiating around from the posterior chest along the level of the mid thoracic spine and burning as it moves anteriorly. She feels like after using a rescue inhaler her breathing has eased more than she had previously been bothered by but the sensation is still present of a burning like quality. She again tested negative for covid and was encouraged to retest in about 3 days.   Vitals are in normal range for pulse, pulse oxygenation, respiratory rate, and her blood pressure which usually is elevated and attributed to white coat hypertension was in the 435W systolic, lower than previously in the clinic.   In general this is a well appearing caucasian female in no acute distress. She is alert and oriented x4 and appropriate throughout the examination. HEENT reveals that the patient is normocephalic, atraumatic. Cardiovascular exam reveals a regular rate and rhythm, no clicks rubs or murmurs are auscultated. Chest is clear to auscultation bilaterally. 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 There is erythema that is less prominent over the remainder of the breast.   Dr. Tammi Klippel also was present and discussed that her treatment plan and dose of radiation appears to be away from the chest wall. He is in agreement to try a medrol dose pack. She will contact us if her symptoms progress. I suspect there may be an orthopedic component to this as well perhaps disc disease or stenosis in the T spine.      Carola Rhine, PAC

## 2020-08-19 ENCOUNTER — Encounter: Payer: Self-pay | Admitting: Radiation Oncology

## 2020-08-19 ENCOUNTER — Ambulatory Visit: Payer: PPO

## 2020-08-19 ENCOUNTER — Other Ambulatory Visit: Payer: Self-pay

## 2020-08-19 ENCOUNTER — Ambulatory Visit
Admission: RE | Admit: 2020-08-19 | Discharge: 2020-08-19 | Disposition: A | Discharge status: Home / Self Care | Payer: PPO | Source: Ambulatory Visit | Attending: Radiation Oncology | Admitting: Radiation Oncology

## 2020-08-19 DIAGNOSIS — Z170 Estrogen receptor positive status [ER+]: Secondary | ICD-10-CM | POA: Diagnosis not present

## 2020-08-19 DIAGNOSIS — C50412 Malignant neoplasm of upper-outer quadrant of left female breast: Secondary | ICD-10-CM | POA: Diagnosis not present

## 2020-08-25 ENCOUNTER — Encounter: Payer: Self-pay | Admitting: *Deleted

## 2020-08-25 ENCOUNTER — Encounter: Payer: Self-pay | Admitting: Hematology & Oncology

## 2020-08-25 ENCOUNTER — Ambulatory Visit: Payer: PPO | Attending: General Surgery

## 2020-08-25 ENCOUNTER — Other Ambulatory Visit: Payer: Self-pay

## 2020-08-25 DIAGNOSIS — C50412 Malignant neoplasm of upper-outer quadrant of left female breast: Secondary | ICD-10-CM | POA: Insufficient documentation

## 2020-08-25 DIAGNOSIS — Z483 Aftercare following surgery for neoplasm: Secondary | ICD-10-CM | POA: Insufficient documentation

## 2020-08-25 DIAGNOSIS — L599 Disorder of the skin and subcutaneous tissue related to radiation, unspecified: Secondary | ICD-10-CM | POA: Insufficient documentation

## 2020-08-25 DIAGNOSIS — M6281 Muscle weakness (generalized): Secondary | ICD-10-CM | POA: Insufficient documentation

## 2020-08-25 DIAGNOSIS — R293 Abnormal posture: Secondary | ICD-10-CM | POA: Insufficient documentation
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
## 2020-08-27 ENCOUNTER — Encounter: Payer: Self-pay | Admitting: Physical Therapy

## 2020-08-27 ENCOUNTER — Other Ambulatory Visit: Payer: Self-pay

## 2020-08-27 ENCOUNTER — Ambulatory Visit: Payer: PPO | Admitting: Physical Therapy

## 2020-08-27 DIAGNOSIS — R293 Abnormal posture: Secondary | ICD-10-CM | POA: Diagnosis not present

## 2020-08-27 DIAGNOSIS — L599 Disorder of the skin and subcutaneous tissue related to radiation, unspecified: Secondary | ICD-10-CM

## 2020-08-27 DIAGNOSIS — M6281 Muscle weakness (generalized): Secondary | ICD-10-CM

## 2020-08-27 DIAGNOSIS — Z483 Aftercare following surgery for neoplasm: Secondary | ICD-10-CM | POA: Diagnosis not present

## 2020-08-27 DIAGNOSIS — C50412 Malignant neoplasm of upper-outer quadrant of left female breast: Secondary | ICD-10-CM

## 2020-08-27 NOTE — Patient Instructions (Signed)
[email or web address] Courses Online Strength After Breast Cancer Look at the right of the page for Lymphedema Education Session   Manual Lymph Drainage for Left Breast.  Do daily.  Do slowly. Use flat hands with just enough pressure to stretch the skin. Do not slide over the skin, but move the skin with the hand you're using. Lie down or sit comfortably (in a recliner, for example) to do this.  Hug yourself:  cross arms and do circles at collar bones near neck 5-7 times (to "wake up" lots of lymph nodes in this area). Take slow deep breaths, allowing your belly to balloon out as your breathe in, 5x (to "wake up" abdominal lymph nodes to take on extra fluid). Right armpit--stretch skin in small circles to stimulate intact lymph nodes there, 5-7x. Left groin area, at panty line--stretch skin in small circles to stimulate lymph nodes 5-7x. Redirect fluid from left chest toward right armpit (stretch skin starting at left chest in 3-4 spots working toward right armpit) 3-4x across the chest. Redirect fluid from left armpit toward left groin (cup your hand around the curve of your left side and do 3-4 "pumps" from armpit to groin) 3-4x down your side. Draw an imaginary diagonal line from upper outer breast through the nipple area toward lower inner breast.  Direct fluid upward and inward from this line toward the pathway across your upper chest (established in #5).  Do this in three rows to treat all of the upper inner breast tissue, and do each row 3-4x. Then repeat #5 above. Direct fluid to treat all of lower outer breast tissue downward and outward toward pathway established in #6 that is aimed at the left groin.  Then repeat #6 above.  End with repeating #3 and #4 above.   [facility] [address], [place]   [address] [phone]
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
## 2020-08-28 ENCOUNTER — Encounter: Payer: Self-pay | Admitting: Physical Therapy

## 2020-08-28 ENCOUNTER — Ambulatory Visit: Payer: PPO | Attending: General Surgery | Admitting: Physical Therapy

## 2020-08-28 DIAGNOSIS — M6281 Muscle weakness (generalized): Secondary | ICD-10-CM | POA: Insufficient documentation

## 2020-08-28 DIAGNOSIS — R2689 Other abnormalities of gait and mobility: Secondary | ICD-10-CM | POA: Diagnosis not present

## 2020-08-28 DIAGNOSIS — R279 Unspecified lack of coordination: Secondary | ICD-10-CM | POA: Insufficient documentation
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
## 2020-08-28 NOTE — Patient Instructions (Signed)
Bladder Irritants  Certain foods and beverages can be irritating to the bladder.  Avoiding these irritants may decrease your symptoms of urinary urgency, frequency or bladder pain.  Even reducing your intake can help with your symptoms.  Not everyone is sensitive to all bladder irritants, so you may consider focusing on one irritant at a time, removing or reducing your intake of that irritant for 7-10 days to see if this change helps your symptoms.  Water intake is also very important.  Below is a list of bladder irritants.  Drinks: alcohol, carbonated beverages, caffeinated beverages such as coffee and tea, drinks with artificial sweeteners, citrus juices, apple juice, tomato juice  Foods: tomatoes and tomato based foods, spicy food, sugar and artificial sweeteners, vinegar, chocolate, raw onion, apples, citrus fruits, pineapple, cranberries, tomatoes, strawberries, plums, peaches, cantaloupe  Other: acidic urine (too concentrated) - see water intake info below  Substitutes you can try that are NOT irritating to the bladder: cooked onion, pears, papayas, sun-brewed decaf teas, watermelons, non-citrus herbal teas, apricots, kava and low-acid instant drinks (Postum).    WATER INTAKE: Remember to drink lots of water (aim for fluid intake of half your body weight with 2/3 of fluids being water).  You may be limiting fluids due to fear of leakage, but this can actually worsen urgency symptoms due to highly concentrated urine.  Water helps balance the pH of your urine so it doesn't become too acidic - acidic urine is a bladder irritant!   Access Code: [facility] URL: [email or web address] Date: 08/28/2020 Prepared by: Stacy Gardner  Exercises Supine Diaphragmatic Breathing - 1 x daily - 7 x weekly - 3 sets - 10 reps Clamshell - 1 x daily - 7 x weekly - 3 sets - 10 reps V Sit Hip Adductor Hamstring Stretch - 1 x daily - 7 x weekly - 2 sets - 3 reps - 45s hold

## 2020-08-28 NOTE — Progress Notes (Signed)
  Radiation Oncology         [phone] ________________________________  Name: Michelle LEMBERGER MRN: [id number]  Date: 08/05/2020  DOB: 11-Feb-1956  SIMULATION NOTE   NARRATIVE:  The patient underwent simulation today for ongoing radiation therapy.  The existing CT study set was employed for the purpose of virtual treatment planning.  The target and avoidance structures were reviewed and modified as necessary.  Treatment planning then occurred.  The radiation boost prescription was entered and confirmed.  A total of 3 complex treatment devices were fabricated in the form of multi-leaf collimators to shape radiation around the targets while maximally excluding nearby normal structures. I have requested : Isodose Plan.    PLAN:  This modified radiation beam arrangement is intended to continue the current radiation dose to an additional 8 Gy in 4 fractions for a total cumulative dose of 50.56 Gy.    ------------------------------------------------  Jodelle Gross, MD, PhD

## 2020-08-29 ENCOUNTER — Encounter: Payer: Self-pay | Admitting: *Deleted
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
## 2020-09-09 ENCOUNTER — Other Ambulatory Visit (HOSPITAL_BASED_OUTPATIENT_CLINIC_OR_DEPARTMENT_OTHER): Payer: Self-pay

## 2020-09-09 MED FILL — Budesonide-Formoterol Fumarate Dihyd Aerosol 80-4.5 MCG/ACT: RESPIRATORY_TRACT | 30 days supply | Qty: 10.2 | Fill #2 | Status: AC

## 2020-09-09 MED FILL — Simvastatin Tab 20 MG: ORAL | 90 days supply | Qty: 90 | Fill #0 | Status: AC

## 2020-09-09 NOTE — Progress Notes (Signed)
                                                                                                                                                             Patient Name: ALMETIA NOP MRN: [id number] DOB: [birth year] Referring Physician: Garnet Koyanagi (Profile Not Attached) Date of Service: 08/19/2020 [facility], [place]                                                        End Of Treatment Note  Diagnoses: C50.412-Malignant neoplasm of upper-outer quadrant of left female breast  Cancer Staging: Stage IA, pT1bN0M0 grade 1, ER/PR positive invasive ductal carcinoma of the left breast.  Intent: Curative  Radiation Treatment Dates: 07/22/2020 through 08/19/2020 Site Technique Total Dose (Gy) Dose per Fx (Gy) Completed Fx Beam Energies  Breast, Left: Breast_Lt 3D 42.56/42.56 2.66 16/16 6XFFF  Breast, Left: Breast_Lt_Bst 3D 8/8 2 4/4 6X   Narrative: The patient tolerated radiation therapy relatively well. She developed dry desquamation along the lateral aspect of the breast just medial to the axilla.  Plan: The patient will receive a call in about one month from the radiation oncology department. She will continue follow up with Dr. Marin Olp as well.   ________________________________________________    Carola Rhine, [facility]

## 2020-09-10 ENCOUNTER — Encounter: Payer: Self-pay | Admitting: Physical Therapy

## 2020-09-10 ENCOUNTER — Ambulatory Visit: Payer: PPO | Admitting: Physical Therapy

## 2020-09-10 ENCOUNTER — Other Ambulatory Visit: Payer: Self-pay

## 2020-09-10 DIAGNOSIS — C50412 Malignant neoplasm of upper-outer quadrant of left female breast: Secondary | ICD-10-CM

## 2020-09-10 DIAGNOSIS — R293 Abnormal posture: Secondary | ICD-10-CM

## 2020-09-10 DIAGNOSIS — Z483 Aftercare following surgery for neoplasm: Secondary | ICD-10-CM | POA: Diagnosis not present

## 2020-09-10 DIAGNOSIS — L599 Disorder of the skin and subcutaneous tissue related to radiation, unspecified: Secondary | ICD-10-CM

## 2020-09-10 DIAGNOSIS — M6281 Muscle weakness (generalized): Secondary | ICD-10-CM
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
## 2020-09-11 ENCOUNTER — Ambulatory Visit: Payer: PPO | Admitting: Physical Therapy

## 2020-09-11 ENCOUNTER — Encounter: Payer: Self-pay | Admitting: Physical Therapy

## 2020-09-11 DIAGNOSIS — M6281 Muscle weakness (generalized): Secondary | ICD-10-CM | POA: Diagnosis not present

## 2020-09-11 DIAGNOSIS — R279 Unspecified lack of coordination: Secondary | ICD-10-CM

## 2020-09-11 NOTE — Patient Instructions (Signed)
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
## 2020-09-25 ENCOUNTER — Other Ambulatory Visit: Payer: Self-pay

## 2020-09-25 ENCOUNTER — Ambulatory Visit: Payer: PPO | Attending: General Surgery | Admitting: Physical Therapy

## 2020-09-25 DIAGNOSIS — R279 Unspecified lack of coordination: Secondary | ICD-10-CM | POA: Diagnosis not present

## 2020-09-25 DIAGNOSIS — M6281 Muscle weakness (generalized): Secondary | ICD-10-CM | POA: Diagnosis not present
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
## 2020-09-27 NOTE — Progress Notes (Signed)
  Radiation Oncology         [phone] ________________________________  Name: Michelle [name] MRN: [id number]  Date of Service: 09/29/2020  DOB: [birth year]  Post Treatment Telephone Note  Diagnosis:   Stage IA, pT1bN0M0 grade 1, ER/PR positive invasive ductal carcinoma of the left breast.  Interval Since Last Radiation:  6 weeks   07/22/2020 through 08/19/2020 Site Technique Total Dose (Gy) Dose per Fx (Gy) Completed Fx Beam Energies  Breast, Left: Breast_Lt 3D 42.56/42.56 2.66 16/16 6XFFF  Breast, Left: Breast_Lt_Bst 3D 8/8 2 4/4 6X    Narrative:  The patient was contacted today for routine follow-up. During treatment she did very well with radiotherapy and did not have significant desquamation. She reports she is doing well that but was having some difficulty with her hiatal hernia. She did not need to start steroids after all. Her skin is improved   Impression/Plan: 1. Stage IA, pT1bN0M0 grade 1, ER/PR positive invasive ductal carcinoma of the left breast.. The patient has been doing well since completion of radiotherapy. We discussed that we would be happy to continue to follow her as needed, but she will also continue to follow up with Dr. Marin Olp in medical oncology. She was counseled on skin care.     Carola Rhine, PAC

## 2020-09-29 ENCOUNTER — Other Ambulatory Visit (HOSPITAL_BASED_OUTPATIENT_CLINIC_OR_DEPARTMENT_OTHER): Payer: Self-pay

## 2020-09-29 ENCOUNTER — Ambulatory Visit
Admission: RE | Admit: 2020-09-29 | Discharge: 2020-09-29 | Disposition: A | Discharge status: Home / Self Care | Payer: PPO | Source: Ambulatory Visit | Attending: Radiation Oncology | Admitting: Radiation Oncology

## 2020-09-29 DIAGNOSIS — C50412 Malignant neoplasm of upper-outer quadrant of left female breast: Secondary | ICD-10-CM

## 2020-10-08 ENCOUNTER — Other Ambulatory Visit: Payer: PPO

## 2020-10-08 ENCOUNTER — Ambulatory Visit: Payer: PPO | Admitting: Hematology & Oncology

## 2020-10-09 ENCOUNTER — Encounter: Payer: PPO | Admitting: Physical Therapy

## 2020-10-15 ENCOUNTER — Encounter: Payer: Self-pay | Admitting: Hematology & Oncology

## 2020-10-15 ENCOUNTER — Other Ambulatory Visit: Payer: Self-pay

## 2020-10-15 ENCOUNTER — Inpatient Hospital Stay: Payer: PPO | Attending: Hematology & Oncology

## 2020-10-15 ENCOUNTER — Encounter: Payer: Self-pay | Admitting: *Deleted

## 2020-10-15 ENCOUNTER — Telehealth: Payer: Self-pay | Admitting: *Deleted

## 2020-10-15 ENCOUNTER — Inpatient Hospital Stay: Payer: PPO | Admitting: Hematology & Oncology

## 2020-10-15 VITALS — BP 154/66 | HR 92 | Temp 98.3°F | Resp 17 | Wt 162.8 lb

## 2020-10-15 DIAGNOSIS — Z79811 Long term (current) use of aromatase inhibitors: Secondary | ICD-10-CM | POA: Insufficient documentation

## 2020-10-15 DIAGNOSIS — Z923 Personal history of irradiation: Secondary | ICD-10-CM | POA: Insufficient documentation

## 2020-10-15 DIAGNOSIS — L819 Disorder of pigmentation, unspecified: Secondary | ICD-10-CM | POA: Insufficient documentation

## 2020-10-15 DIAGNOSIS — Z887 Allergy status to serum and vaccine status: Secondary | ICD-10-CM | POA: Insufficient documentation

## 2020-10-15 DIAGNOSIS — Z885 Allergy status to narcotic agent status: Secondary | ICD-10-CM | POA: Diagnosis not present

## 2020-10-15 DIAGNOSIS — Z170 Estrogen receptor positive status [ER+]: Secondary | ICD-10-CM | POA: Diagnosis not present

## 2020-10-15 DIAGNOSIS — C50412 Malignant neoplasm of upper-outer quadrant of left female breast: Secondary | ICD-10-CM | POA: Insufficient documentation

## 2020-10-15 DIAGNOSIS — Z79899 Other long term (current) drug therapy: Secondary | ICD-10-CM | POA: Insufficient documentation

## 2020-10-15 LAB — CMP (CANCER CENTER ONLY)
ALT: 19 U/L (ref 0–44)
AST: 17 U/L (ref 15–41)
Albumin: 4.6 g/dL (ref 3.5–5.0)
Alkaline Phosphatase: 47 U/L (ref 38–126)
Anion gap: 9 (ref 5–15)
BUN: 16 mg/dL (ref 8–23)
CO2: 29 mmol/L (ref 22–32)
Calcium: 10.1 mg/dL (ref 8.9–10.3)
Chloride: 100 mmol/L (ref 98–111)
Creatinine: 0.76 mg/dL (ref 0.44–1.00)
GFR, Estimated: >60 mL/min (ref >60)
Glucose, Bld: 105 mg/dL — ABNORMAL HIGH (ref 70–99)
Potassium: 4 mmol/L (ref 3.5–5.1)
Sodium: 138 mmol/L (ref 135–145)
Total Bilirubin: 0.8 mg/dL (ref 0.3–1.2)
Total Protein: 7.4 g/dL (ref 6.5–8.1)

## 2020-10-15 LAB — CBC WITH DIFFERENTIAL (CANCER CENTER ONLY)
Abs Immature Granulocytes: 0.01 10*3/uL (ref 0.00–0.07)
Basophils Absolute: 0.1 10*3/uL (ref 0.0–0.1)
Basophils Relative: 2 %
Eosinophils Absolute: 0.6 10*3/uL — ABNORMAL HIGH (ref 0.0–0.5)
Eosinophils Relative: 11 %
HCT: 40.3 % (ref 36.0–46.0)
Hemoglobin: 13.4 g/dL (ref 12.0–15.0)
Immature Granulocytes: 0 %
Lymphocytes Relative: 37 %
Lymphs Abs: 2.1 10*3/uL (ref 0.7–4.0)
MCH: 28.7 pg (ref 26.0–34.0)
MCHC: 33.3 g/dL (ref 30.0–36.0)
MCV: 86.3 fL (ref 80.0–100.0)
Monocytes Absolute: 0.7 10*3/uL (ref 0.1–1.0)
Monocytes Relative: 11 %
Neutro Abs: 2.3 10*3/uL (ref 1.7–7.7)
Neutrophils Relative %: 39 %
Platelet Count: 304 10*3/uL (ref 150–400)
RBC: 4.67 MIL/uL (ref 3.87–5.11)
RDW: 12.6 % (ref 11.5–15.5)
WBC Count: 5.8 10*3/uL (ref 4.0–10.5)
nRBC: 0 % (ref 0.0–0.2)

## 2020-10-15 LAB — VITAMIN D 25 HYDROXY (VIT D DEFICIENCY, FRACTURES): Vit D, 25-Hydroxy: 92.59 ng/mL (ref 30–100)

## 2020-10-15 LAB — LACTATE DEHYDROGENASE: LDH: 131 U/L (ref 98–192)

## 2020-10-15 NOTE — Progress Notes (Signed)
Patient is doing well. Her skin has completely healed and she no longer has issues with pain.   Oncology Nurse Navigator Documentation  Oncology Nurse Navigator Flowsheets 10/15/2020  Abnormal Finding Date -  Confirmed Diagnosis Date -  Diagnosis Status -  Planned Course of Treatment -  Phase of Treatment -  Radiation Actual Start Date: -  Radiation Expected End Date: -  Expected Surgery Date -  Surgery Actual Start Date: -  Navigator Follow Up Date: 12/31/2020  Navigator Follow Up Reason: Follow-up Appointment  Navigator Location [facility]  Referral Date to RadOnc/MedOnc -  Navigator Encounter Type Follow-up Appt  Telephone -  Patient Visit Type MedOnc  Treatment Phase Active Tx  Barriers/Navigation Needs No Barriers At This Time  Education -  Interventions Psycho-Social Support  Acuity Level 1-No Barriers  Coordination of Care -  Education Method -  Support Groups/Services Friends and Family  Time Spent with Patient 15

## 2020-10-15 NOTE — Progress Notes (Signed)
Hematology and Oncology Follow Up Visit  MARLEA GAMBILL [id number] June 04, 1955 65 y.o. 10/15/2020   Principle Diagnosis:  Stage 1A (T1bN0M0) infiltrating ductal carcinoma of the LEFT breast- -ER positive/HER2 negative --Oncotype score of 17.  Current Therapy:   Status post left lumpectomy on 06/11/2020 Status post radiation therapy-completed on 08/19/2020 Femara 2.5 mg p.o. daily -she has not yet started     Interim History:  Ms. Wachs is back for follow-up.  She is doing quite well.  She completed all of her radiation therapy.  This was about 2 months ago.  She is not on Femara by her own admission.  She is worried that is going to drive her estrogen even lower.  I told her that Femara does not stop the ovaries from making estrogen.  I just prevents the estrogen from getting inside cancer cells.  I told her that we only use Femara in women who are postmenopausal.  It is not effective in premenopausal women.  She will think about using the Femara.  Otherwise, she is feeling well.  She is having no problems with fatigue or weakness.  She is going up to [place] this weekend for couple weeks to move her mother.  She has had no cough or shortness of breath.  She has had no nausea or vomiting.  There has been no change in bowel or bladder habits.  She has had no bleeding.  There is been no leg swelling.  Overall, I would say performance status is ECOG 0.     Medications:  Current Outpatient Medications:    Alpha-Lipoic Acid 600 MG TABS, Take 600 mg by mouth daily., Disp: , Rfl:    amLODipine (NORVASC) 5 MG tablet, TAKE 1 TABLET (5 MG TOTAL) BY MOUTH DAILY., Disp: 90 tablet, Rfl: 1   Ascorbic Acid (VITAMIN C) 1000 MG tablet, Take 1,000 mg by mouth 2 (two) times daily., Disp: , Rfl:    budesonide-formoterol (SYMBICORT) 80-4.5 MCG/ACT inhaler, INHALE 2 PUFFS BY MOUTH INTO THE LUNGS IN THE MORNING AND AT BEDTIME. (Patient taking differently: Inhale 1-2 puffs into the lungs 2 (two) times  daily.), Disp: 10.2 g, Rfl: 5   Calcium-Magnesium (CAL-MAG PO), Take 1 tablet by mouth 3 (three) times daily., Disp: , Rfl:    Cholecalciferol (DIALYVITE VITAMIN D 5000) 125 MCG (5000 UT) capsule, Take 5,000 Units by mouth daily., Disp: , Rfl:    CHROMIUM PO, Take 2 tablets by mouth 2 (two) times daily., Disp: , Rfl:    CINNAMON PO, Take 125 mg by mouth 3 (three) times daily., Disp: , Rfl:    co-enzyme Q-10 50 MG capsule, Take 100 mg by mouth daily., Disp: , Rfl:    Digestive Enzyme CAPS, Take 1 capsule by mouth 3 (three) times daily with meals., Disp: , Rfl:    fluticasone (FLONASE) 50 MCG/ACT nasal spray, Place 1 spray into both nostrils 2 (two) times daily., Disp: , Rfl:    GARLIC PO, Take 1 tablet by mouth 3 (three) times daily., Disp: , Rfl:    hydrochlorothiazide (HYDRODIURIL) 25 MG tablet, TAKE 1 TABLET (25 MG TOTAL) BY MOUTH DAILY., Disp: 90 tablet, Rfl: 1   Levomefolic Acid (5-MTHF PO), Take 1,000 mcg by mouth daily., Disp: , Rfl:    loratadine (CLARITIN) 10 MG tablet, Take 10 mg by mouth at bedtime., Disp: , Rfl:    Melatonin 10 MG CAPS, Take 20 mg by mouth at bedtime., Disp: , Rfl:    Menaquinone-7 (VITAMIN K2 PO), Take 1  tablet by mouth daily., Disp: , Rfl:    metFORMIN (GLUCOPHAGE-XR) 500 MG 24 hr tablet, Take 1 tablet (500 mg total) by mouth 2 (two) times daily., Disp: 180 tablet, Rfl: 1   Methylcobalamin 50000 MCG SOLR, Inject 5,000 mcg as directed 2 (two) times a week. Tuesdays and Saturdays, Disp: , Rfl:    MULTIPLE VITAMIN PO, Take 2 capsules by mouth in the morning, at noon, and at bedtime., Disp: , Rfl:    NALTREXONE HCL PO, Take 3 mg by mouth at bedtime. Natrexone er 1.5 mg capsules x 2, Disp: , Rfl:    Omega-3 Fatty Acids (THE VERY FINEST FISH OIL) LIQD, Take 15 mLs by mouth daily., Disp: , Rfl:    POTASSIUM IODIDE PO, Take 225 mcg by mouth 3 (three) times daily., Disp: , Rfl:    Probiotic Product (PROBIOTIC PO), Take 1 capsule by mouth at bedtime., Disp: , Rfl:     QUERCETIN PO, Take 1 tablet by mouth 3 (three) times daily., Disp: , Rfl:    simvastatin (ZOCOR) 20 MG tablet, TAKE 1 TABLET (20 MG TOTAL) BY MOUTH DAILY AT 6 PM. (Patient taking differently: Take 20 mg by mouth at bedtime.), Disp: 90 tablet, Rfl: 1   thyroid (ARMOUR) 90 MG tablet, Take 45 mg by mouth daily., Disp: , Rfl:    TURMERIC PO, Take 1,500 mg by mouth 2 (two) times daily., Disp: , Rfl:    VITAMIN A PO, Take 20,000 Units by mouth daily., Disp: , Rfl:    VITAMIN E PO, Take 260 mg by mouth daily., Disp: , Rfl:    Zinc 30 MG CAPS, Take 30 mg by mouth 2 (two) times daily., Disp: , Rfl:    acetaminophen (TYLENOL) 500 MG tablet, Take 1,000 mg by mouth 2 (two) times daily., Disp: , Rfl:    cyclobenzaprine (FLEXERIL) 10 MG tablet, Take 1 tablet (10 mg total) by mouth 3 (three) times daily as needed for muscle spasms. (Patient not taking: No sig reported), Disp: 30 tablet, Rfl: 0   letrozole (FEMARA) 2.5 MG tablet, Take 1 tablet (2.5 mg total) by mouth daily. (Patient not taking: Reported on 07/10/2020), Disp: 60 tablet, Rfl: 3   methylPREDNISolone (MEDROL) 4 MG tablet, Take 6 tabs po day 1, 5 tabs po day 2, 4 tabs po day 3, 3 tabs po day 4, 2 tabs po day 5, 1 tab po day 6 then stop, Disp: 21 tablet, Rfl: 0   traMADol (ULTRAM) 50 MG tablet, Take 2 tablets (100 mg total) by mouth every 6 (six) hours as needed. (Patient not taking: No sig reported), Disp: 10 tablet, Rfl: 0  Allergies:  Allergies  Allergen Reactions   Influenza Vaccine Live Other (See Comments)    Sever flu symptoms, chest tightness, wheezing   Shrimp [Shellfish Allergy] Itching    Allergic to shrimp.  Wheezing and tightness in chest.   Tdap [Tetanus-Diphth-Acell Pertussis] Other (See Comments)    Change in level of consciousness, altered mental state   Demerol [Meperidine] Nausea And Vomiting   Peanut-Containing Drug Products Other (See Comments)    wheezing   Chlorhexidine Gluconate [Chlorhexidine] Rash    Patient states she  is sensitive to CHG solution and prefers not to use it   Latex Rash    Past Medical History, Surgical history, Social history, and Family History were reviewed and updated.  Review of Systems: Review of Systems  Constitutional: Negative.   HENT:  Negative.    Eyes: Negative.   Respiratory:  Negative.    Cardiovascular: Negative.   Gastrointestinal: Negative.   Endocrine: Negative.   Genitourinary: Negative.    Musculoskeletal: Negative.   Skin: Negative.   Neurological: Negative.   Hematological: Negative.   Psychiatric/Behavioral: Negative.     Physical Exam:  weight is 162 lb 12.8 oz (73.8 kg). Her oral temperature is 98.3 F (36.8 C). Her blood pressure is 154/66 (abnormal) and her pulse is 92. Her respiration is 17 and oxygen saturation is 100%.   Wt Readings from Last 3 Encounters:  10/15/20 162 lb 12.8 oz (73.8 kg)  08/12/20 164 lb (74.4 kg)  07/10/20 164 lb 6.4 oz (74.6 kg)    Physical Exam Vitals reviewed.  Constitutional:      Comments: Her breast exam shows right breast with no masses, edema or erythema.  There is no right axillary adenopathy.  Left breast shows the lumpectomy that is well-healed.  There is at the edge of the areola at about the 1 o'clock position.  This is well-healed.  She has no breast masses.  There are some hyperpigmentation on the left breast from radiation.  The hyperpigmentation is mostly noted in the lower aspect of the left breast.  There is no left axillary adenopathy.  HENT:     Head: Normocephalic and atraumatic.  Eyes:     Pupils: Pupils are equal, round, and reactive to light.  Cardiovascular:     Rate and Rhythm: Normal rate and regular rhythm.     Heart sounds: Normal heart sounds.  Pulmonary:     Effort: Pulmonary effort is normal.     Breath sounds: Normal breath sounds.  Abdominal:     General: Bowel sounds are normal.     Palpations: Abdomen is soft.  Musculoskeletal:        General: No tenderness or deformity. Normal  range of motion.     Cervical back: Normal range of motion.  Lymphadenopathy:     Cervical: No cervical adenopathy.  Skin:    General: Skin is warm and dry.     Findings: No erythema or rash.  Neurological:     Mental Status: She is alert and oriented to person, place, and time.  Psychiatric:        Behavior: Behavior normal.        Thought Content: Thought content normal.        Judgment: Judgment normal.     Lab Results  Component Value Date   WBC 5.8 10/15/2020   HGB 13.4 10/15/2020   HCT 40.3 10/15/2020   MCV 86.3 10/15/2020   PLT 304 10/15/2020     Chemistry      Component Value Date/Time   NA 138 10/15/2020 1012   K 4.0 10/15/2020 1012   CL 100 10/15/2020 1012   CO2 29 10/15/2020 1012   BUN 16 10/15/2020 1012   CREATININE 0.76 10/15/2020 1012   CREATININE 0.74 11/06/2019 1113      Component Value Date/Time   CALCIUM 10.1 10/15/2020 1012   ALKPHOS 47 10/15/2020 1012   AST 17 10/15/2020 1012   ALT 19 10/15/2020 1012   BILITOT 0.8 10/15/2020 1012      Impression and Plan: Ms. Horstman is a very nice 65 year old postmenopausal white female.  She has a early stage-stage IA-ductal carcinoma of the left breast.  She underwent lumpectomy .  She had a low Oncotype score.  She will complete radiation next week.  I do not see any problems with her being on Femara.  Again, I told her that the Femara by itself would not decrease her estrogen.  Again she will think about Femara.  I know that she will have good time up in [place].  I will see any problems with her moving her mother.  I know this is quite strenuous physically.  I would like to see her back around the holidays in November.  I will check her vitamin D level.   Volanda Napoleon, MD 8/31/202212:25 PM

## 2020-10-15 NOTE — Telephone Encounter (Signed)
Per 10/15/20 LOS - called and gave upcoming appointments - confirmed

## 2020-10-16 LAB — FOLLICLE STIMULATING HORMONE: FSH: 65.8 m[IU]/mL

## 2020-10-16 LAB — LUTEINIZING HORMONE: LH: 24.9 m[IU]/mL

## 2020-10-21 LAB — ESTRADIOL, ULTRA SENS: Estradiol, Sensitive: <2.5 pg/mL

## 2020-11-04 ENCOUNTER — Ambulatory Visit (INDEPENDENT_AMBULATORY_CARE_PROVIDER_SITE_OTHER): Payer: PPO

## 2020-11-04 VITALS — Ht 63.0 in | Wt 162.0 lb

## 2020-11-04 DIAGNOSIS — Z0000 Encounter for general adult medical examination without abnormal findings: Secondary | ICD-10-CM

## 2020-11-04 NOTE — Progress Notes (Signed)
Subjective:   Michelle [name] is a 65 y.o. female who presents for an Initial Medicare Annual Wellness Visit.  I connected with Michelle [name] today by telephone and verified that I am speaking with the correct person using two identifiers. Location patient: home Location provider: work Persons participating in the virtual visit: patient, Marine scientist.    I discussed the limitations, risks, security and privacy concerns of performing an evaluation and management service by telephone and the availability of in person appointments. I also discussed with the patient that there may be a patient responsible charge related to this service. The patient expressed understanding and verbally consented to this telephonic visit.    Interactive audio and video telecommunications were attempted between this provider and patient, however failed, due to patient having technical difficulties OR patient did not have access to video capability.  We continued and completed visit with audio only.  Some vital signs may be absent or patient reported.   Time Spent with patient on telephone encounter: 20 minutes   Review of Systems     Cardiac Risk Factors include: advanced age (>55men, >56 women);diabetes mellitus;dyslipidemia;hypertension     Objective:    Today's Vitals   11/04/20 0939  Weight: 162 lb (73.5 kg)  Height: 5\' 3"  (1.6 m)   Body mass index is 28.7 kg/m.  Advanced Directives 11/04/2020 10/15/2020 08/28/2020 08/12/2020 07/10/2020 06/30/2020 06/11/2020  Does Patient Have a Medical Advance Directive? No No No No No No No  Would patient like information on creating a medical advance directive? Yes (MAU/Ambulatory/Procedural Areas - Information given) No - Patient declined No - Patient declined No - Patient declined No - Patient declined No - Patient declined No - Patient declined    Current Medications (verified) Outpatient Encounter Medications as of 11/04/2020  Medication Sig   Alpha-Lipoic Acid 600 MG  TABS Take 600 mg by mouth daily.   amLODipine (NORVASC) 5 MG tablet TAKE 1 TABLET (5 MG TOTAL) BY MOUTH DAILY.   Ascorbic Acid (VITAMIN C) 1000 MG tablet Take 1,000 mg by mouth 2 (two) times daily.   budesonide-formoterol (SYMBICORT) 80-4.5 MCG/ACT inhaler INHALE 2 PUFFS BY MOUTH INTO THE LUNGS IN THE MORNING AND AT BEDTIME. (Patient taking differently: Inhale 1-2 puffs into the lungs 2 (two) times daily.)   Calcium-Magnesium (CAL-MAG PO) Take 1 tablet by mouth 3 (three) times daily.   Cholecalciferol (DIALYVITE VITAMIN D 5000) 125 MCG (5000 UT) capsule Take 5,000 Units by mouth daily.   CHROMIUM PO Take 2 tablets by mouth 2 (two) times daily.   CINNAMON PO Take 125 mg by mouth 3 (three) times daily.   co-enzyme Q-10 50 MG capsule Take 100 mg by mouth daily.   Digestive Enzyme CAPS Take 1 capsule by mouth 3 (three) times daily with meals.   fluticasone (FLONASE) 50 MCG/ACT nasal spray Place 1 spray into both nostrils 2 (two) times daily.   GARLIC PO Take 1 tablet by mouth 3 (three) times daily.   hydrochlorothiazide (HYDRODIURIL) 25 MG tablet TAKE 1 TABLET (25 MG TOTAL) BY MOUTH DAILY.   Levomefolic Acid (5-MTHF PO) Take 1,000 mcg by mouth daily.   loratadine (CLARITIN) 10 MG tablet Take 10 mg by mouth at bedtime.   Melatonin 10 MG CAPS Take 20 mg by mouth at bedtime.   Menaquinone-7 (VITAMIN K2 PO) Take 1 tablet by mouth daily.   metFORMIN (GLUCOPHAGE-XR) 500 MG 24 hr tablet Take 1 tablet (500 mg total) by mouth 2 (two) times daily.   Methylcobalamin  50000 MCG SOLR Inject 5,000 mcg as directed 2 (two) times a week. Tuesdays and Saturdays   MULTIPLE VITAMIN PO Take 2 capsules by mouth in the morning, at noon, and at bedtime.   NALTREXONE HCL PO Take 3 mg by mouth at bedtime. Natrexone er 1.5 mg capsules x 2   Omega-3 Fatty Acids (THE VERY FINEST FISH OIL) LIQD Take 15 mLs by mouth daily.   POTASSIUM IODIDE PO Take 225 mcg by mouth 3 (three) times daily.   Probiotic Product (PROBIOTIC PO) Take  1 capsule by mouth at bedtime.   QUERCETIN PO Take 1 tablet by mouth 3 (three) times daily.   simvastatin (ZOCOR) 20 MG tablet TAKE 1 TABLET (20 MG TOTAL) BY MOUTH DAILY AT 6 PM. (Patient taking differently: Take 20 mg by mouth at bedtime.)   thyroid (ARMOUR) 90 MG tablet Take 45 mg by mouth daily.   TURMERIC PO Take 1,500 mg by mouth 2 (two) times daily.   VITAMIN A PO Take 20,000 Units by mouth daily.   VITAMIN E PO Take 260 mg by mouth daily.   Zinc 30 MG CAPS Take 30 mg by mouth 2 (two) times daily.   letrozole (FEMARA) 2.5 MG tablet Take 1 tablet (2.5 mg total) by mouth daily. (Patient not taking: Reported on 07/10/2020)   [DISCONTINUED] acetaminophen (TYLENOL) 500 MG tablet Take 1,000 mg by mouth 2 (two) times daily.   [DISCONTINUED] cyclobenzaprine (FLEXERIL) 10 MG tablet Take 1 tablet (10 mg total) by mouth 3 (three) times daily as needed for muscle spasms. (Patient not taking: No sig reported)   [DISCONTINUED] methylPREDNISolone (MEDROL) 4 MG tablet Take 6 tabs po day 1, 5 tabs po day 2, 4 tabs po day 3, 3 tabs po day 4, 2 tabs po day 5, 1 tab po day 6 then stop   [DISCONTINUED] traMADol (ULTRAM) 50 MG tablet Take 2 tablets (100 mg total) by mouth every 6 (six) hours as needed. (Patient not taking: No sig reported)   No facility-administered encounter medications on file as of 11/04/2020.    Allergies (verified) Influenza vaccine live, Shrimp [shellfish allergy], Tdap [tetanus-diphth-acell pertussis], Demerol [meperidine], Peanut-containing drug products, Chlorhexidine gluconate [chlorhexidine], and Latex   History: Past Medical History:  Diagnosis Date   Allergy    Asthma    Bronchitis    Cancer ([place])    COPD (chronic obstructive pulmonary disease) ([place])    mild   Diabetes mellitus without complication ([place])    Epstein Barr infection    Goals of care, counseling/discussion 06/30/2020   History of hiatal hernia    History of kidney stones    Hyperlipidemia    Hypertension     stress induced/ work makes it worse.   Hypothyroidism    Neuromuscular disorder ([place])    Neuropathy   Obesity    Pneumonia    Thyroid disease    Past Surgical History:  Procedure Laterality Date   BREAST LUMPECTOMY WITH RADIOACTIVE SEED AND SENTINEL LYMPH NODE BIOPSY Left 06/11/2020   Procedure: LEFT BREAST LUMPECTOMY WITH RADIOACTIVE SEED AND LEFT AXILLARY SENTINEL LYMPH NODE BIOPSY;  Surgeon: Rolm Bookbinder, MD;  Location: [facility];  Service: General;  Laterality: Left;   TONSILLECTOMY AND ADENOIDECTOMY  childhood   TUBAL LIGATION     VAGINAL HYSTERECTOMY  2011   Family History  Problem Relation Age of Onset   Heart disease Mother    Hyperlipidemia Mother    Stroke Mother    Hypertension Mother    Heart  disease Father    Colon polyps Brother    Prostate cancer Brother    Colon cancer Neg Hx    Stomach cancer Neg Hx    Rectal cancer Neg Hx    Esophageal cancer Neg Hx    Social History   Socioeconomic History   Marital status: Married    Spouse name: Not on file   Number of children: Not on file   Years of education: Not on file   Highest education level: Not on file  Occupational History   Occupation: RN  Tobacco Use   Smoking status: Never   Smokeless tobacco: Never  Vaping Use   Vaping Use: Never used  Substance and Sexual Activity   Alcohol use: No   Drug use: No   Sexual activity: Yes  Other Topics Concern   Not on file  Social History Narrative   Exercise- Walks about an hour a day   Right handed   Lives husband one story   Social Determinants of Health   Financial Resource Strain: Low Risk    Difficulty of Paying Living Expenses: Not hard at all  Food Insecurity: No Food Insecurity   Worried About Charity fundraiser in the Last Year: Never true   [place] in the Last Year: Never true  Transportation Needs: No Transportation Needs   Lack of Transportation (Medical): No   Lack of Transportation (Non-Medical): No  Physical Activity:  Sufficiently Active   Days of Exercise per Week: 7 days   Minutes of Exercise per Session: 30 min  Stress: Stress Concern Present   Feeling of Stress : To some extent  Social Connections: Engineer, building services of Communication with Friends and Family: More than three times a week   Frequency of Social Gatherings with Friends and Family: More than three times a week   Attends Religious Services: 1 to 4 times per year   Active Member of [employer] or Organizations: Yes   Attends Archivist Meetings: 1 to 4 times per year   Marital Status: Married    Tobacco Counseling Counseling given: Not Answered   Clinical Intake:  Pre-visit preparation completed: Yes  Pain : No/denies pain     BMI - recorded: 28.7 Nutritional Status: BMI 25 -29 Overweight Nutritional Risks: None Diabetes: Yes CBG done?: No Did pt. bring in CBG monitor from home?: No  How often do you need to have someone help you when you read instructions, pamphlets, or other written materials from your doctor or pharmacy?: 1 - Never  Diabetes:  Is the patient diabetic?  Yes  If diabetic, was a CBG obtained today?  No  Did the patient bring in their glucometer from home?  No phone visit How often do you monitor your CBG's? occasionally.   Financial Strains and Diabetes Management:  Are you having any financial strains with the device, your supplies or your medication? No .  Does the patient want to be seen by Chronic Care Management for management of their diabetes?  No  Would the patient like to be referred to a Nutritionist or for Diabetic Management?  No   Diabetic Exams:  Diabetic Eye Exam: Completed 04/02/2020.   Diabetic Foot Exam:  To be completed by PCP.   Interpreter Needed?: No  Information entered by :: Caroleen Hamman LPN   Activities of Daily Living In your present state of health, do you have any difficulty performing the following activities: 11/04/2020 06/03/2020  Hearing?  N  N  Vision? N N  Difficulty concentrating or making decisions? N N  Walking or climbing stairs? N N  Dressing or bathing? N N  Doing errands, shopping? N N  Preparing Food and eating ? N -  Using the Toilet? N -  In the past six months, have you accidently leaked urine? Y -  Comment occasionally -  Do you have problems with loss of bowel control? N -  Managing your Medications? N -  Managing your Finances? N -  Housekeeping or managing your Housekeeping? N -  Some recent data might be hidden    Patient Care Team: Carollee Herter, Alferd Apa, DO as PCP - General (Family Medicine) Marica Otter, [place] (Optometry) Kerney Elbe, MD as Referring Physician (Family Medicine) Marylynn Pearson, MD as Consulting Physician (Obstetrics and Gynecology) Marin Olp Rudell Cobb, MD as Oncology Nurse Navigator (Oncology) Cordelia Poche, RN as Oncology Nurse Navigator  Indicate any recent Medical Services you may have received from other than [facility] providers in the past year (date may be approximate).     Assessment:   This is a routine wellness examination for Michelle [name].  Hearing/Vision screen Hearing Screening - Comments:: No issues Vision Screening - Comments:: Wears glasses Last eye exam-2021-Dr.Miller  Dietary issues and exercise activities discussed: Current Exercise Habits: Home exercise routine, Type of exercise: walking, Time (Minutes): 30, Frequency (Times/Week): 7, Weekly Exercise (Minutes/Week): 210, Intensity: Mild, Exercise limited by: None identified   Goals Addressed             This Visit's Progress    Patient Stated       Lose some weight & increase activity       Depression Screen PHQ 2/9 Scores 11/04/2020 11/06/2019 07/12/2017 10/19/2016  PHQ - 2 Score 0 0 0 0    Fall Risk Fall Risk  11/04/2020 09/27/2019 09/13/2018  Falls in the past year? 0 0 0  Comment - - Emmi Telephone Survey: data to providers prior to load  Number falls in past yr: 0 0 -  Injury with Fall? 0 0 -   Follow up Falls prevention discussed - -    FALL RISK PREVENTION PERTAINING TO THE HOME:  Any stairs in or around the home? Yes  If so, are there any without handrails? No  Home free of loose throw rugs in walkways, pet beds, electrical cords, etc? Yes  Adequate lighting in your home to reduce risk of falls? Yes   ASSISTIVE DEVICES UTILIZED TO PREVENT FALLS:  Life alert? No  Use of a cane, walker or w/c? No  Grab bars in the bathroom? No  Shower chair or bench in shower? Yes  Elevated toilet seat or a handicapped toilet? No   TIMED UP AND GO:  Was the test performed? No . Phone viist   Cognitive Function:Normal cognitive status assessed by this Nurse Health Advisor. No abnormalities found.          Immunizations Immunization History  Administered Date(s) Administered   PFIZER(Purple Top)SARS-COV-2 Vaccination 04/07/2019, 05/01/2019    TDAP status: Allergic  Flu vaccine status: Allergic  Pneumococcal vaccine status: Due, Education has been provided regarding the importance of this vaccine. Advised may receive this vaccine at local pharmacy or Health Dept. Aware to provide a copy of the vaccination record if obtained from local pharmacy or Health Dept. Verbalized acceptance and understanding.  Covid-19 vaccine status: Declined, Education has been provided regarding the importance of this vaccine but patient still declined. Advised may receive  this vaccine at local pharmacy or Health Dept.or vaccine clinic. Aware to provide a copy of the vaccination record if obtained from local pharmacy or Health Dept. Verbalized acceptance and understanding.Had first 2 doses-declines booster.  Qualifies for Shingles Vaccine? Yes   Zostavax completed No   Shingrix Completed?: No.    Education has been provided regarding the importance of this vaccine. Patient has been advised to call insurance company to determine out of pocket expense if they have not yet received this vaccine. Advised  may also receive vaccine at local pharmacy or Health Dept. Verbalized acceptance and understanding.  Screening Tests Health Maintenance  Topic Date Due   Zoster Vaccines- Shingrix (1 of 2) Never done   COVID-19 Vaccine (3 - Pfizer risk series) 05/29/2019   FOOT EXAM  04/23/2020   HEMOGLOBIN A1C  02/11/2021   OPHTHALMOLOGY EXAM  04/02/2021   MAMMOGRAM  04/14/2021   URINE MICROALBUMIN  08/12/2021   DEXA SCAN  04/14/2022   COLONOSCOPY (Pts 45-13yrs Insurance coverage will need to be confirmed)  11/12/2026   Hepatitis C Screening  Completed   HPV VACCINES  Aged Out    Health Maintenance  Health Maintenance Due  Topic Date Due   Zoster Vaccines- Shingrix (1 of 2) Never done   COVID-19 Vaccine (3 - Pfizer risk series) 05/29/2019   FOOT EXAM  04/23/2020    Colorectal cancer screening: Type of screening: Colonoscopy. Completed 11/10/2016. Repeat every 10 years  Mammogram status: Followed by Dr. Marin Olp.  Bone Density status: Completed 04/14/2020. Results reflect: Bone density results: NORMAL. Repeat every 2 years.  Lung Cancer Screening: (Low Dose CT Chest recommended if Age 6-80 years, 30 pack-year currently smoking OR have quit w/in 15years.) does not qualify.     Additional Screening:  Hepatitis C Screening: Completed 01/29/2015  Vision Screening: Recommended annual ophthalmology exams for early detection of glaucoma and other disorders of the eye. Is the patient up to date with their annual eye exam?  Yes  Who is the provider or what is the name of the office in which the patient attends annual eye exams? Dr. Sabra Heck   Dental Screening: Recommended annual dental exams for proper oral hygiene  Community Resource Referral / Chronic Care Management: CRR required this visit?  No   CCM required this visit?  No      Plan:     I have personally reviewed and noted the following in the patient's chart:   Medical and social history Use of alcohol, tobacco or illicit drugs   Current medications and supplements including opioid prescriptions. Patient is not currently taking opioid prescriptions. Functional ability and status Nutritional status Physical activity Advanced directives List of other physicians Hospitalizations, surgeries, and ER visits in previous 12 months Vitals Screenings to include cognitive, depression, and falls Referrals and appointments  In addition, I have reviewed and discussed with patient certain preventive protocols, quality metrics, and best practice recommendations. A written personalized care plan for preventive services as well as general preventive health recommendations were provided to patient.   Due to this being a telephonic visit, the after visit summary with patients personalized plan was offered to patient via mail or my-chart. Patient would like to access on my-chart.   Marta Antu, LPN   4/43/1540  Nurse Health Advisor  Nurse Notes: None

## 2020-11-04 NOTE — Patient Instructions (Signed)
Michelle [name] , Thank you for taking time to complete your Medicare Wellness Visit. I appreciate your ongoing commitment to your health goals. Please review the following plan we discussed and let me know if I can assist you in the future.   Screening recommendations/referrals: Colonoscopy:  Completed 11/10/2016. Due 11/11/2026 Mammogram: Followed by Dr. Marin Olp Bone Density: Completed 04/14/2020-Due 04/14/2022 Recommended yearly ophthalmology/optometry visit for glaucoma screening and checkup Recommended yearly dental visit for hygiene and checkup  Vaccinations: Influenza vaccine: Allergic Pneumococcal vaccine: Due Tdap vaccine: Allergic Shingles vaccine: Declined   Covid-19:Declined booster  Advanced directives: Information mailed today  Conditions/risks identified: See problem list  Next appointment: Follow up in one year for your annual wellness visit    Preventive Care 65 Years and Older, Female Preventive care refers to lifestyle choices and visits with your health care provider that can promote health and wellness. What does preventive care include? A yearly physical exam. This is also called an annual well check. Dental exams once or twice a year. Routine eye exams. Ask your health care provider how often you should have your eyes checked. Personal lifestyle choices, including: Daily care of your teeth and gums. Regular physical activity. Eating a healthy diet. Avoiding tobacco and drug use. Limiting alcohol use. Practicing safe sex. Taking low-dose aspirin every day. Taking vitamin and mineral supplements as recommended by your health care provider. What happens during an annual well check? The services and screenings done by your health care provider during your annual well check will depend on your age, overall health, lifestyle risk factors, and family history of disease. Counseling  Your health care provider may ask you questions about your: Alcohol use. Tobacco  use. Drug use. Emotional well-being. Home and relationship well-being. Sexual activity. Eating habits. History of falls. Memory and ability to understand (cognition). Work and work Statistician. Reproductive health. Screening  You may have the following tests or measurements: Height, weight, and BMI. Blood pressure. Lipid and cholesterol levels. These may be checked every 5 years, or more frequently if you are over 35 years old. Skin check. Lung cancer screening. You may have this screening every year starting at age 55 if you have a 30-pack-year history of smoking and currently smoke or have quit within the past 15 years. Fecal occult blood test (FOBT) of the stool. You may have this test every year starting at age 62. Flexible sigmoidoscopy or colonoscopy. You may have a sigmoidoscopy every 5 years or a colonoscopy every 10 years starting at age 46. Hepatitis C blood test. Hepatitis B blood test. Sexually transmitted disease (STD) testing. Diabetes screening. This is done by checking your blood sugar (glucose) after you have not eaten for a while (fasting). You may have this done every 1-3 years. Bone density scan. This is done to screen for osteoporosis. You may have this done starting at age 41. Mammogram. This may be done every 1-2 years. Talk to your health care provider about how often you should have regular mammograms. Talk with your health care provider about your test results, treatment options, and if necessary, the need for more tests. Vaccines  Your health care provider may recommend certain vaccines, such as: Influenza vaccine. This is recommended every year. Tetanus, diphtheria, and acellular pertussis (Tdap, Td) vaccine. You may need a Td booster every 10 years. Zoster vaccine. You may need this after age 62. Pneumococcal 13-valent conjugate (PCV13) vaccine. One dose is recommended after age 64. Pneumococcal polysaccharide (PPSV23) vaccine. One dose is recommended  after  age 12. Talk to your health care provider about which screenings and vaccines you need and how often you need them. This information is not intended to replace advice given to you by your health care provider. Make sure you discuss any questions you have with your health care provider. Document Released: 02/28/2015 Document Revised: 10/22/2015 Document Reviewed: 12/03/2014 Elsevier Interactive Patient Education  2017 [place] Prevention in the Home Falls can cause injuries. They can happen to people of all ages. There are many things you can do to make your home safe and to help prevent falls. What can I do on the outside of my home? Regularly fix the edges of walkways and driveways and fix any cracks. Remove anything that might make you trip as you walk through a door, such as a raised step or threshold. Trim any bushes or trees on the path to your home. Use bright outdoor lighting. Clear any walking paths of anything that might make someone trip, such as rocks or tools. Regularly check to see if handrails are loose or broken. Make sure that both sides of any steps have handrails. Any raised decks and porches should have guardrails on the edges. Have any leaves, snow, or ice cleared regularly. Use sand or salt on walking paths during winter. Clean up any spills in your garage right away. This includes oil or grease spills. What can I do in the bathroom? Use night lights. Install grab bars by the toilet and in the tub and shower. Do not use towel bars as grab bars. Use non-skid mats or decals in the tub or shower. If you need to sit down in the shower, use a plastic, non-slip stool. Keep the floor dry. Clean up any water that spills on the floor as soon as it happens. Remove soap buildup in the tub or shower regularly. Attach bath mats securely with double-sided non-slip rug tape. Do not have throw rugs and other things on the floor that can make you trip. What can I do  in the bedroom? Use night lights. Make sure that you have a light by your bed that is easy to reach. Do not use any sheets or blankets that are too big for your bed. They should not hang down onto the floor. Have a firm chair that has side arms. You can use this for support while you get dressed. Do not have throw rugs and other things on the floor that can make you trip. What can I do in the kitchen? Clean up any spills right away. Avoid walking on wet floors. Keep items that you use a lot in easy-to-reach places. If you need to reach something above you, use a strong step stool that has a grab bar. Keep electrical cords out of the way. Do not use floor polish or wax that makes floors slippery. If you must use wax, use non-skid floor wax. Do not have throw rugs and other things on the floor that can make you trip. What can I do with my stairs? Do not leave any items on the stairs. Make sure that there are handrails on both sides of the stairs and use them. Fix handrails that are broken or loose. Make sure that handrails are as long as the stairways. Check any carpeting to make sure that it is firmly attached to the stairs. Fix any carpet that is loose or worn. Avoid having throw rugs at the top or bottom of the stairs. If you do have  throw rugs, attach them to the floor with carpet tape. Make sure that you have a light switch at the top of the stairs and the bottom of the stairs. If you do not have them, ask someone to add them for you. What else can I do to help prevent falls? Wear shoes that: Do not have high heels. Have rubber bottoms. Are comfortable and fit you well. Are closed at the toe. Do not wear sandals. If you use a stepladder: Make sure that it is fully opened. Do not climb a closed stepladder. Make sure that both sides of the stepladder are locked into place. Ask someone to hold it for you, if possible. Clearly mark and make sure that you can see: Any grab bars or  handrails. First and last steps. Where the edge of each step is. Use tools that help you move around (mobility aids) if they are needed. These include: Canes. Walkers. Scooters. Crutches. Turn on the lights when you go into a dark area. Replace any light bulbs as soon as they burn out. Set up your furniture so you have a clear path. Avoid moving your furniture around. If any of your floors are uneven, fix them. If there are any pets around you, be aware of where they are. Review your medicines with your doctor. Some medicines can make you feel dizzy. This can increase your chance of falling. Ask your doctor what other things that you can do to help prevent falls. This information is not intended to replace advice given to you by your health care provider. Make sure you discuss any questions you have with your health care provider. Document Released: 11/28/2008 Document Revised: 07/10/2015 Document Reviewed: 03/08/2014 Elsevier Interactive Patient Education  2017 [employer].

## 2020-11-06 ENCOUNTER — Other Ambulatory Visit (HOSPITAL_BASED_OUTPATIENT_CLINIC_OR_DEPARTMENT_OTHER): Payer: Self-pay

## 2020-11-06 ENCOUNTER — Encounter: Payer: PPO | Admitting: Physical Therapy

## 2020-11-06 MED FILL — Budesonide-Formoterol Fumarate Dihyd Aerosol 80-4.5 MCG/ACT: RESPIRATORY_TRACT | 30 days supply | Qty: 10.2 | Fill #3 | Status: AC

## 2020-11-14 ENCOUNTER — Other Ambulatory Visit (HOSPITAL_BASED_OUTPATIENT_CLINIC_OR_DEPARTMENT_OTHER): Payer: Self-pay

## 2020-11-14 ENCOUNTER — Other Ambulatory Visit: Payer: Self-pay

## 2020-11-14 ENCOUNTER — Encounter: Payer: Self-pay | Admitting: Family Medicine

## 2020-11-14 ENCOUNTER — Ambulatory Visit (INDEPENDENT_AMBULATORY_CARE_PROVIDER_SITE_OTHER): Payer: PPO | Admitting: Family Medicine

## 2020-11-14 VITALS — BP 138/70 | HR 95 | Temp 98.7°F | Resp 18 | Ht 63.0 in | Wt 165.0 lb

## 2020-11-14 DIAGNOSIS — E119 Type 2 diabetes mellitus without complications: Secondary | ICD-10-CM

## 2020-11-14 DIAGNOSIS — E1169 Type 2 diabetes mellitus with other specified complication: Secondary | ICD-10-CM | POA: Diagnosis not present

## 2020-11-14 DIAGNOSIS — IMO0002 Reserved for concepts with insufficient information to code with codable children: Secondary | ICD-10-CM | POA: Insufficient documentation

## 2020-11-14 DIAGNOSIS — I10 Essential (primary) hypertension: Secondary | ICD-10-CM | POA: Diagnosis not present

## 2020-11-14 DIAGNOSIS — E785 Hyperlipidemia, unspecified: Secondary | ICD-10-CM

## 2020-11-14 DIAGNOSIS — E039 Hypothyroidism, unspecified: Secondary | ICD-10-CM

## 2020-11-14 DIAGNOSIS — E1151 Type 2 diabetes mellitus with diabetic peripheral angiopathy without gangrene: Secondary | ICD-10-CM | POA: Diagnosis not present

## 2020-11-14 DIAGNOSIS — Z923 Personal history of irradiation: Secondary | ICD-10-CM

## 2020-11-14 DIAGNOSIS — E1165 Type 2 diabetes mellitus with hyperglycemia: Secondary | ICD-10-CM | POA: Diagnosis not present

## 2020-11-14 LAB — COMPREHENSIVE METABOLIC PANEL
ALT: 18 U/L (ref 0–35)
AST: 16 U/L (ref 0–37)
Albumin: 4.4 g/dL (ref 3.5–5.2)
Alkaline Phosphatase: 52 U/L (ref 39–117)
BUN: 14 mg/dL (ref 6–23)
CO2: 30 mEq/L (ref 19–32)
Calcium: 10 mg/dL (ref 8.4–10.5)
Chloride: 99 mEq/L (ref 96–112)
Creatinine, Ser: 0.75 mg/dL (ref 0.40–1.20)
GFR: 82.23 mL/min (ref >60.00)
Glucose, Bld: 98 mg/dL (ref 70–99)
Potassium: 4.1 mEq/L (ref 3.5–5.1)
Sodium: 138 mEq/L (ref 135–145)
Total Bilirubin: 1 mg/dL (ref 0.2–1.2)
Total Protein: 6.8 g/dL (ref 6.0–8.3)

## 2020-11-14 LAB — LIPID PANEL
Cholesterol: 153 mg/dL (ref 0–200)
HDL: 62 mg/dL (ref >39.00)
LDL Cholesterol: 78 mg/dL (ref 0–99)
NonHDL: 90.78
Total CHOL/HDL Ratio: 2
Triglycerides: 65 mg/dL (ref 0.0–149.0)
VLDL: 13 mg/dL (ref 0.0–40.0)

## 2020-11-14 LAB — TSH: TSH: 1.28 u[IU]/mL (ref 0.35–5.50)

## 2020-11-14 LAB — HEMOGLOBIN A1C: Hgb A1c MFr Bld: 5.8 % (ref 4.6–6.5)

## 2020-11-14 MED ORDER — BLOOD GLUCOSE MONITOR KIT: PACK | 0 refills | Status: DC

## 2020-11-14 MED ORDER — FREESTYLE LITE W/DEVICE KIT
PACK | 0 refills | Status: DC
  Filled 2020-11-14: qty 1, 30d supply, fill #0

## 2020-11-14 MED ORDER — SIMVASTATIN 20 MG PO TABS
ORAL_TABLET | ORAL | 1 refills | Status: DC
  Filled 2020-11-14 – 2020-12-04 (×2): qty 90, fill #0
  Filled 2020-12-08: qty 90, 90d supply, fill #0
  Filled 2021-03-09: qty 90, 90d supply, fill #1

## 2020-11-14 MED ORDER — METFORMIN HCL ER 500 MG PO TB24
500.0000 mg | ORAL_TABLET | Freq: Two times a day (BID) | ORAL | 1 refills | Status: DC
Start: 2020-11-14 — End: 2021-06-24
  Filled 2020-11-14 – 2020-12-26 (×2): qty 180, 90d supply, fill #0
  Filled 2021-03-23: qty 180, 90d supply, fill #1

## 2020-11-14 MED ORDER — FREESTYLE LANCETS MISC
0 refills | Status: DC
  Filled 2020-11-14: qty 100, 33d supply, fill #0

## 2020-11-14 MED ORDER — HYDROCHLOROTHIAZIDE 25 MG PO TABS
ORAL_TABLET | Freq: Every day | ORAL | 1 refills | Status: DC
Start: 2020-11-14 — End: 2021-06-02
  Filled 2020-11-14 – 2020-12-04 (×2): qty 90, fill #0
  Filled 2020-12-08: qty 90, 90d supply, fill #0
  Filled 2021-03-09: qty 90, 90d supply, fill #1

## 2020-11-14 MED ORDER — GLUCOSE BLOOD VI STRP
ORAL_STRIP | 0 refills | Status: DC
  Filled 2020-11-14: qty 100, 33d supply, fill #0

## 2020-11-14 MED ORDER — AMLODIPINE BESYLATE 5 MG PO TABS
ORAL_TABLET | Freq: Every day | ORAL | 1 refills | Status: DC
  Filled 2020-11-14 – 2020-12-04 (×2): qty 90, fill #0
  Filled 2020-12-08: qty 90, 90d supply, fill #0
  Filled 2021-03-09: qty 90, 90d supply, fill #1

## 2020-11-14 NOTE — Assessment & Plan Note (Signed)
hgba1c to be checked, minimize simple carbs. Increase exercise as tolerated. Continue current meds  

## 2020-11-14 NOTE — Assessment & Plan Note (Signed)
Per endo °

## 2020-11-14 NOTE — Patient Instructions (Signed)
Carbohydrate Counting for Diabetes Mellitus, Adult Carbohydrate counting is a method of keeping track of how many carbohydrates you eat. Eating carbohydrates naturally increases the amount of sugar (glucose) in the blood. Counting how many carbohydrates you eat improves your blood glucose control, which helps you manage your diabetes. It is important to know how many carbohydrates you can safely have in each meal. This is different for every person. A dietitian can help you make a meal plan and calculate how many carbohydrates you should have at each meal and snack. What foods contain carbohydrates? Carbohydrates are found in the following foods: Grains, such as breads and cereals. Dried beans and soy products. Starchy vegetables, such as potatoes, peas, and corn. Fruit and fruit juices. Milk and yogurt. Sweets and snack foods, such as cake, cookies, candy, chips, and soft drinks. How do I count carbohydrates in foods? There are two ways to count carbohydrates in food. You can read food labels or learn standard serving sizes of foods. You can use either of the methods or a combination of both. Using the Nutrition Facts label The Nutrition Facts list is included on the labels of almost all packaged foods and beverages in the [place] It includes: The serving size. Information about nutrients in each serving, including the grams (g) of carbohydrate per serving. To use the Nutrition Facts: Decide how many servings you will have. Multiply the number of servings by the number of carbohydrates per serving. The resulting number is the total amount of carbohydrates that you will be having. Learning the standard serving sizes of foods When you eat carbohydrate foods that are not packaged or do not include Nutrition Facts on the label, you need to measure the servings in order to count the amount of carbohydrates. Measure the foods that you will eat with a food scale or measuring cup, if needed. Decide how  many standard-size servings you will eat. Multiply the number of servings by 15. For foods that contain carbohydrates, one serving equals 15 g of carbohydrates. For example, if you eat 2 cups or 10 oz (300 g) of strawberries, you will have eaten 2 servings and 30 g of carbohydrates (2 servings x 15 g = 30 g). For foods that have more than one food mixed, such as soups and casseroles, you must count the carbohydrates in each food that is included. The following list contains standard serving sizes of common carbohydrate-rich foods. Each of these servings has about 15 g of carbohydrates: 1 slice of bread. 1 six-inch (15 cm) tortilla. ? cup or 2 oz (53 g) cooked rice or pasta.  cup or 3 oz (85 g) cooked or canned, drained and rinsed beans or lentils.  cup or 3 oz (85 g) starchy vegetable, such as peas, corn, or squash.  cup or 4 oz (120 g) hot cereal.  cup or 3 oz (85 g) boiled or mashed potatoes, or  or 3 oz (85 g) of a large baked potato.  cup or 4 fl oz (118 mL) fruit juice. 1 cup or 8 fl oz (237 mL) milk. 1 small or 4 oz (106 g) apple.  or 2 oz (63 g) of a medium banana. 1 cup or 5 oz (150 g) strawberries. 3 cups or 1 oz (24 g) popped popcorn. What is an example of carbohydrate counting? To calculate the number of carbohydrates in this sample meal, follow the steps shown below. Sample meal 3 oz (85 g) chicken breast. ? cup or 4 oz (106 g) brown   rice.  cup or 3 oz (85 g) corn. 1 cup or 8 fl oz (237 mL) milk. 1 cup or 5 oz (150 g) strawberries with sugar-free whipped topping. Carbohydrate calculation Identify the foods that contain carbohydrates: Rice. Corn. Milk. Strawberries. Calculate how many servings you have of each food: 2 servings rice. 1 serving corn. 1 serving milk. 1 serving strawberries. Multiply each number of servings by 15 g: 2 servings rice x 15 g = 30 g. 1 serving corn x 15 g = 15 g. 1 serving milk x 15 g = 15 g. 1 serving strawberries x 15 g = 15  g. Add together all of the amounts to find the total grams of carbohydrates eaten: 30 g + 15 g + 15 g + 15 g = 75 g of carbohydrates total. What are tips for following this plan? Shopping Develop a meal plan and then make a shopping list. Buy fresh and frozen vegetables, fresh and frozen fruit, dairy, eggs, beans, lentils, and whole grains. Look at food labels. Choose foods that have more fiber and less sugar. Avoid processed foods and foods with added sugars. Meal planning Aim to have the same amount of carbohydrates at each meal and for each snack time. Plan to have regular, balanced meals and snacks. Where to find more information American Diabetes Association: www.diabetes.org Centers for Disease Control and Prevention: www.cdc.gov Summary Carbohydrate counting is a method of keeping track of how many carbohydrates you eat. Eating carbohydrates naturally increases the amount of sugar (glucose) in the blood. Counting how many carbohydrates you eat improves your blood glucose control, which helps you manage your diabetes. A dietitian can help you make a meal plan and calculate how many carbohydrates you should have at each meal and snack. This information is not intended to replace advice given to you by your health care provider. Make sure you discuss any questions you have with your health care provider. Document Revised: 02/01/2019 Document Reviewed: 02/02/2019 Elsevier Patient Education  2021 [employer]  

## 2020-11-14 NOTE — Assessment & Plan Note (Signed)
Encourage heart healthy diet such as MIND or DASH diet, increase exercise, avoid trans fats, simple carbohydrates and processed foods, consider a krill or fish or flaxseed oil cap daily.  °

## 2020-11-14 NOTE — Assessment & Plan Note (Signed)
Well controlled, no changes to meds. Encouraged heart healthy diet such as the DASH diet and exercise as tolerated.  °

## 2020-11-14 NOTE — Progress Notes (Signed)
Subjective:   By signing my name below, I, Michelle [name], attest that this documentation has been prepared under the direction and in the presence of [name], DO. 11/14/2020    Patient ID: Michelle [name], female    DOB: 1956-02-04, 65 y.o.   MRN: [id number]  Chief Complaint  Patient presents with   Hyperlipidemia   Diabetes   Follow-up    HPI Patient is in today for a office visit.  She is requesting a refill on 500 mg metformin daily PO, 25 mg hydrochlorothiazide daily PO, 5 mg amlodipine daily PO, 20 mg simvastatin daily PO.  Her blood pressure is doing well during this visit. She did not take her blood pressure medication this morning. She continues taking 25 mg hydrochlorothiazide daily PO, 5 mg amlodipine daily PO and reports no new issues while taking it.   BP Readings from Last 3 Encounters:  11/14/20 138/70  10/15/20 (!) 154/66  08/12/20 (!) 160/73   Pulse Readings from Last 3 Encounters:  11/14/20 95  10/15/20 92  08/12/20 83   She is requesting to measure her insulin index during this visit. She sporadically measures her blood sugar at home. She shares her measuring device with her husband and does not her own personal device. She is requesting a prescription for a measuring device. She continues taking 500 mg metformin daily PO and reports no new issues while taking it.   Lab Results  Component Value Date   HGBA1C 5.6 08/12/2020   She continues seeing her oncologist at this time.  She has 2 pfizer Covid-19 vaccines a this time and is not interested in getting anymore booster vaccines. She is not interested in getting a pneumonia vaccine during this visit.    Past Medical History:  Diagnosis Date   Allergy    Asthma    Bronchitis    Cancer ([place])    COPD (chronic obstructive pulmonary disease) ([place])    mild   Diabetes mellitus without complication ([place])    Epstein Barr infection    Goals of care, counseling/discussion 06/30/2020   History  of hiatal hernia    History of kidney stones    Hyperlipidemia    Hypertension    stress induced/ work makes it worse.   Hypothyroidism    Neuromuscular disorder ([place])    Neuropathy   Obesity    Pneumonia    Thyroid disease     Past Surgical History:  Procedure Laterality Date   BREAST LUMPECTOMY WITH RADIOACTIVE SEED AND SENTINEL LYMPH NODE BIOPSY Left 06/11/2020   Procedure: LEFT BREAST LUMPECTOMY WITH RADIOACTIVE SEED AND LEFT AXILLARY SENTINEL LYMPH NODE BIOPSY;  Surgeon: Rolm Bookbinder, MD;  Location: [facility];  Service: General;  Laterality: Left;   TONSILLECTOMY AND ADENOIDECTOMY  childhood   TUBAL LIGATION     VAGINAL HYSTERECTOMY  2011    Family History  Problem Relation Age of Onset   Heart disease Mother    Hyperlipidemia Mother    Stroke Mother    Hypertension Mother    Heart disease Father    Colon polyps Brother    Prostate cancer Brother    Colon cancer Neg Hx    Stomach cancer Neg Hx    Rectal cancer Neg Hx    Esophageal cancer Neg Hx     Social History   Socioeconomic History   Marital status: Married    Spouse name: Not on file   Number of children: Not on file  Years of education: Not on file   Highest education level: Not on file  Occupational History   Occupation: RN  Tobacco Use   Smoking status: Never   Smokeless tobacco: Never  Vaping Use   Vaping Use: Never used  Substance and Sexual Activity   Alcohol use: No   Drug use: No   Sexual activity: Yes  Other Topics Concern   Not on file  Social History Narrative   Exercise- Walks about an hour a day   Right handed   Lives husband one story   Social Determinants of Health   Financial Resource Strain: Low Risk    Difficulty of Paying Living Expenses: Not hard at all  Food Insecurity: No Food Insecurity   Worried About Charity fundraiser in the Last Year: Never true   [place] in the Last Year: Never true  Transportation Needs: No Transportation Needs   Lack of  Transportation (Medical): No   Lack of Transportation (Non-Medical): No  Physical Activity: Sufficiently Active   Days of Exercise per Week: 7 days   Minutes of Exercise per Session: 30 min  Stress: Stress Concern Present   Feeling of Stress : To some extent  Social Connections: Engineer, building services of Communication with Friends and Family: More than three times a week   Frequency of Social Gatherings with Friends and Family: More than three times a week   Attends Religious Services: 1 to 4 times per year   Active Member of [employer] or Organizations: Yes   Attends Archivist Meetings: 1 to 4 times per year   Marital Status: Married  Human resources officer Violence: Not At Risk   Fear of Current or Ex-Partner: No   Emotionally Abused: No   Physically Abused: No   Sexually Abused: No    Outpatient Medications Prior to Visit  Medication Sig Dispense Refill   Alpha-Lipoic Acid 600 MG TABS Take 600 mg by mouth daily.     Ascorbic Acid (VITAMIN C) 1000 MG tablet Take 1,000 mg by mouth 2 (two) times daily.     budesonide-formoterol (SYMBICORT) 80-4.5 MCG/ACT inhaler INHALE 2 PUFFS BY MOUTH INTO THE LUNGS IN THE MORNING AND AT BEDTIME. (Patient taking differently: Inhale 1-2 puffs into the lungs 2 (two) times daily.) 10.2 g 5   Calcium-Magnesium (CAL-MAG PO) Take 1 tablet by mouth 3 (three) times daily.     Cholecalciferol (DIALYVITE VITAMIN D 5000) 125 MCG (5000 UT) capsule Take 5,000 Units by mouth daily.     CHROMIUM PO Take 2 tablets by mouth 2 (two) times daily.     CINNAMON PO Take 125 mg by mouth 3 (three) times daily.     co-enzyme Q-10 50 MG capsule Take 100 mg by mouth daily.     Digestive Enzyme CAPS Take 1 capsule by mouth 3 (three) times daily with meals.     fluticasone (FLONASE) 50 MCG/ACT nasal spray Place 1 spray into both nostrils 2 (two) times daily.     GARLIC PO Take 1 tablet by mouth 3 (three) times daily.     Levomefolic Acid (5-MTHF PO) Take 1,000 mcg by  mouth daily.     loratadine (CLARITIN) 10 MG tablet Take 10 mg by mouth at bedtime.     Melatonin 10 MG CAPS Take 20 mg by mouth at bedtime.     Menaquinone-7 (VITAMIN K2 PO) Take 1 tablet by mouth daily.     Methylcobalamin 50000 MCG SOLR Inject  5,000 mcg as directed 2 (two) times a week. Tuesdays and Saturdays     MULTIPLE VITAMIN PO Take 2 capsules by mouth in the morning, at noon, and at bedtime.     NALTREXONE HCL PO Take 3 mg by mouth at bedtime. Natrexone er 1.5 mg capsules x 2     Omega-3 Fatty Acids (THE VERY FINEST FISH OIL) LIQD Take 15 mLs by mouth daily.     POTASSIUM IODIDE PO Take 225 mcg by mouth 3 (three) times daily.     Probiotic Product (PROBIOTIC PO) Take 1 capsule by mouth at bedtime.     QUERCETIN PO Take 1 tablet by mouth 3 (three) times daily.     thyroid (ARMOUR) 90 MG tablet Take 45 mg by mouth daily.     TURMERIC PO Take 1,500 mg by mouth 2 (two) times daily.     VITAMIN A PO Take 20,000 Units by mouth daily.     VITAMIN E PO Take 260 mg by mouth daily.     Zinc 30 MG CAPS Take 30 mg by mouth 2 (two) times daily.     amLODipine (NORVASC) 5 MG tablet TAKE 1 TABLET (5 MG TOTAL) BY MOUTH DAILY. 90 tablet 1   hydrochlorothiazide (HYDRODIURIL) 25 MG tablet TAKE 1 TABLET (25 MG TOTAL) BY MOUTH DAILY. 90 tablet 1   metFORMIN (GLUCOPHAGE-XR) 500 MG 24 hr tablet Take 1 tablet (500 mg total) by mouth 2 (two) times daily. 180 tablet 1   simvastatin (ZOCOR) 20 MG tablet TAKE 1 TABLET (20 MG TOTAL) BY MOUTH DAILY AT 6 PM. (Patient taking differently: Take 20 mg by mouth at bedtime.) 90 tablet 1   letrozole (FEMARA) 2.5 MG tablet Take 1 tablet (2.5 mg total) by mouth daily. (Patient not taking: Reported on 07/10/2020) 60 tablet 3   No facility-administered medications prior to visit.    Allergies  Allergen Reactions   Influenza Vaccine Live Other (See Comments)    Sever flu symptoms, chest tightness, wheezing   Shrimp [Shellfish Allergy] Itching    Allergic to shrimp.   Wheezing and tightness in chest.   Tdap [Tetanus-Diphth-Acell Pertussis] Other (See Comments)    Change in level of consciousness, altered mental state   Demerol [Meperidine] Nausea And Vomiting   Peanut-Containing Drug Products Other (See Comments)    wheezing   Chlorhexidine Gluconate [Chlorhexidine] Rash    Patient states she is sensitive to CHG solution and prefers not to use it   Latex Rash    Review of Systems  Constitutional:  Negative for fever and malaise/fatigue.  HENT:  Negative for congestion.   Eyes:  Negative for blurred vision.  Respiratory:  Negative for shortness of breath.   Cardiovascular:  Negative for chest pain, palpitations and leg swelling.  Gastrointestinal:  Negative for abdominal pain, blood in stool and nausea.  Genitourinary:  Negative for dysuria and frequency.  Musculoskeletal:  Negative for falls.  Skin:  Negative for rash.  Neurological:  Negative for dizziness, loss of consciousness and headaches.  Endo/Heme/Allergies:  Negative for environmental allergies.  Psychiatric/Behavioral:  Negative for depression. The patient is not nervous/anxious.       Objective:    Physical Exam Vitals and nursing note reviewed.  Constitutional:      General: She is not in acute distress.    Appearance: Normal appearance. She is not ill-appearing.  HENT:     Head: Normocephalic and atraumatic.     Right Ear: External ear normal.  Left Ear: External ear normal.  Eyes:     Extraocular Movements: Extraocular movements intact.     Pupils: Pupils are equal, round, and reactive to light.  Cardiovascular:     Rate and Rhythm: Normal rate and regular rhythm.     Heart sounds: Normal heart sounds. No murmur heard.   No gallop.  Pulmonary:     Effort: Pulmonary effort is normal. No respiratory distress.     Breath sounds: Normal breath sounds. No wheezing or rales.  Skin:    General: Skin is warm and dry.  Neurological:     Mental Status: She is alert and  oriented to person, place, and time.  Psychiatric:        Behavior: Behavior normal.        Judgment: Judgment normal.    BP 138/70 (BP Location: Right Arm, Patient Position: Sitting, Cuff Size: Normal)   Pulse 95   Temp 98.7 F (37.1 C) (Oral)   Resp 18   Ht _0  (1.6 m)   Wt 165 lb (74.8 kg)   SpO2 97%   BMI 29.23 kg/m  Wt Readings from Last 3 Encounters:  11/14/20 165 lb (74.8 kg)  11/04/20 162 lb (73.5 kg)  10/15/20 162 lb 12.8 oz (73.8 kg)    Diabetic Foot Exam - Simple   No data filed    Lab Results  Component Value Date   WBC 5.8 10/15/2020   HGB 13.4 10/15/2020   HCT 40.3 10/15/2020   PLT 304 10/15/2020   GLUCOSE 105 (H) 10/15/2020   CHOL 171 08/12/2020   TRIG 71 08/12/2020   HDL 67 08/12/2020   LDLCALC 91 08/12/2020   ALT 19 10/15/2020   AST 17 10/15/2020   NA 138 10/15/2020   K 4.0 10/15/2020   CL 100 10/15/2020   CREATININE 0.76 10/15/2020   BUN 16 10/15/2020   CO2 29 10/15/2020   TSH 1.38 07/24/2018   HGBA1C 5.6 08/12/2020   MICROALBUR 0.2 11/06/2019    Lab Results  Component Value Date   TSH 1.38 07/24/2018   Lab Results  Component Value Date   WBC 5.8 10/15/2020   HGB 13.4 10/15/2020   HCT 40.3 10/15/2020   MCV 86.3 10/15/2020   PLT 304 10/15/2020   Lab Results  Component Value Date   NA 138 10/15/2020   K 4.0 10/15/2020   CO2 29 10/15/2020   GLUCOSE 105 (H) 10/15/2020   BUN 16 10/15/2020   CREATININE 0.76 10/15/2020   BILITOT 0.8 10/15/2020   ALKPHOS 47 10/15/2020   AST 17 10/15/2020   ALT 19 10/15/2020   PROT 7.4 10/15/2020   ALBUMIN 4.6 10/15/2020   CALCIUM 10.1 10/15/2020   ANIONGAP 9 10/15/2020   GFR 90.93 05/13/2020   Lab Results  Component Value Date   CHOL 171 08/12/2020   Lab Results  Component Value Date   HDL 67 08/12/2020   Lab Results  Component Value Date   LDLCALC 91 08/12/2020   Lab Results  Component Value Date   TRIG 71 08/12/2020   Lab Results  Component Value Date   CHOLHDL 2  05/13/2020   Lab Results  Component Value Date   HGBA1C 5.6 08/12/2020       Assessment & Plan:   Problem List Items Addressed This Visit       Unprioritized   Diabetes mellitus without complication ([place])    DEYC1K to be checked , minimize simple carbs. Increase exercise as tolerated. Continue current  meds       Relevant Medications   metFORMIN (GLUCOPHAGE-XR) 500 MG 24 hr tablet   simvastatin (ZOCOR) 20 MG tablet   DM (diabetes mellitus) type II uncontrolled, periph vascular disorder (HCC)   Relevant Medications   metFORMIN (GLUCOPHAGE-XR) 500 MG 24 hr tablet   hydrochlorothiazide (HYDRODIURIL) 25 MG tablet   simvastatin (ZOCOR) 20 MG tablet   amLODipine (NORVASC) 5 MG tablet   HTN (hypertension)    Well controlled, no changes to meds. Encouraged heart healthy diet such as the DASH diet and exercise as tolerated.       Relevant Medications   hydrochlorothiazide (HYDRODIURIL) 25 MG tablet   simvastatin (ZOCOR) 20 MG tablet   amLODipine (NORVASC) 5 MG tablet   Other Relevant Orders   TSH   Hemoglobin A1c   Comprehensive metabolic panel   Lipid panel   Hyperlipidemia    Encourage heart healthy diet such as MIND or DASH diet, increase exercise, avoid trans fats, simple carbohydrates and processed foods, consider a krill or fish or flaxseed oil cap daily.       Relevant Medications   hydrochlorothiazide (HYDRODIURIL) 25 MG tablet   simvastatin (ZOCOR) 20 MG tablet   amLODipine (NORVASC) 5 MG tablet   Hypothyroidism    Per endo      Other Visit Diagnoses     Type 2 diabetes mellitus with hyperglycemia, without long-term current use of insulin (HCC)    -  Primary   Relevant Medications   blood glucose meter kit and supplies KIT   metFORMIN (GLUCOPHAGE-XR) 500 MG 24 hr tablet   simvastatin (ZOCOR) 20 MG tablet   Other Relevant Orders   TSH   Hemoglobin A1c   Comprehensive metabolic panel   Lipid panel   Insulin, random   Hyperlipidemia associated with type  2 diabetes mellitus (HCC)       Relevant Medications   metFORMIN (GLUCOPHAGE-XR) 500 MG 24 hr tablet   hydrochlorothiazide (HYDRODIURIL) 25 MG tablet   simvastatin (ZOCOR) 20 MG tablet   amLODipine (NORVASC) 5 MG tablet   Other Relevant Orders   Comprehensive metabolic panel   Lipid panel   Essential hypertension       Relevant Medications   hydrochlorothiazide (HYDRODIURIL) 25 MG tablet   simvastatin (ZOCOR) 20 MG tablet   amLODipine (NORVASC) 5 MG tablet   Hyperlipidemia LDL goal <100       Relevant Medications   hydrochlorothiazide (HYDRODIURIL) 25 MG tablet   simvastatin (ZOCOR) 20 MG tablet   amLODipine (NORVASC) 5 MG tablet   S/P radiation > 12 weeks       Relevant Medications   hydrochlorothiazide (HYDRODIURIL) 25 MG tablet   Other Relevant Orders   Thyroid Panel With TSH        Meds ordered this encounter  Medications   blood glucose meter kit and supplies KIT    Sig: As directed    Dispense:  1 each    Refill:  0    Order Specific Question:   Number of strips    Answer:   100    Order Specific Question:   Number of lancets    Answer:   100   metFORMIN (GLUCOPHAGE-XR) 500 MG 24 hr tablet    Sig: Take 1 tablet (500 mg total) by mouth 2 (two) times daily.    Dispense:  180 tablet    Refill:  1   hydrochlorothiazide (HYDRODIURIL) 25 MG tablet    Sig: TAKE 1 TABLET (25 MG  TOTAL) BY MOUTH DAILY.    Dispense:  90 tablet    Refill:  1   simvastatin (ZOCOR) 20 MG tablet    Sig: TAKE 1 TABLET (20 MG TOTAL) BY MOUTH DAILY AT 6 PM.    Dispense:  90 tablet    Refill:  1   amLODipine (NORVASC) 5 MG tablet    Sig: TAKE 1 TABLET (5 MG TOTAL) BY MOUTH DAILY.    Dispense:  90 tablet    Refill:  1    I, Lowne Chase, [employer], DO, personally preformed the services described in this documentation.  All medical record entries made by the scribe were at my direction and in my presence.  I have reviewed the chart and discharge instructions (if applicable) and agree that the  record reflects my personal performance and is accurate and complete. 11/14/2020   I,Michelle [name],acting as a scribe for [name], DO.,have documented all relevant documentation on the behalf of [name], DO,as directed by  [name], DO while in the presence of [name], DO.   [name], DO

## 2020-11-17 LAB — THYROID PANEL WITH TSH
Free Thyroxine Index: 2.2 (ref 1.4–3.8)
T3 Uptake: 32 % (ref 22–35)
T4, Total: 6.9 ug/dL (ref 5.1–11.9)
TSH: 1.24 mIU/L (ref 0.40–4.50)

## 2020-11-17 LAB — INSULIN, RANDOM: Insulin: 4.7 u[IU]/mL

## 2020-11-20 ENCOUNTER — Encounter: Payer: PPO | Admitting: Physical Therapy

## 2020-11-21 ENCOUNTER — Other Ambulatory Visit (HOSPITAL_BASED_OUTPATIENT_CLINIC_OR_DEPARTMENT_OTHER): Payer: Self-pay

## 2020-11-24 ENCOUNTER — Ambulatory Visit: Payer: PPO | Attending: General Surgery

## 2020-11-24 ENCOUNTER — Other Ambulatory Visit: Payer: Self-pay

## 2020-11-24 VITALS — Wt 162.1 lb

## 2020-11-24 DIAGNOSIS — Z483 Aftercare following surgery for neoplasm: Secondary | ICD-10-CM | POA: Insufficient documentation
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
## 2020-12-04 ENCOUNTER — Encounter: Payer: PPO | Admitting: Physical Therapy

## 2020-12-04 ENCOUNTER — Other Ambulatory Visit (HOSPITAL_BASED_OUTPATIENT_CLINIC_OR_DEPARTMENT_OTHER): Payer: Self-pay

## 2020-12-05 ENCOUNTER — Other Ambulatory Visit (HOSPITAL_BASED_OUTPATIENT_CLINIC_OR_DEPARTMENT_OTHER): Payer: Self-pay

## 2020-12-08 ENCOUNTER — Other Ambulatory Visit (HOSPITAL_BASED_OUTPATIENT_CLINIC_OR_DEPARTMENT_OTHER): Payer: Self-pay

## 2020-12-19 ENCOUNTER — Other Ambulatory Visit: Payer: Self-pay | Admitting: Family Medicine

## 2020-12-19 ENCOUNTER — Other Ambulatory Visit (HOSPITAL_BASED_OUTPATIENT_CLINIC_OR_DEPARTMENT_OTHER): Payer: Self-pay

## 2020-12-19 MED ORDER — FREESTYLE LITE TEST VI STRP
ORAL_STRIP | 2 refills | Status: DC
  Filled 2020-12-19: qty 100, 33d supply, fill #0
  Filled 2021-07-29: qty 100, 33d supply, fill #1

## 2020-12-26 ENCOUNTER — Other Ambulatory Visit (HOSPITAL_BASED_OUTPATIENT_CLINIC_OR_DEPARTMENT_OTHER): Payer: Self-pay

## 2020-12-28 MED FILL — Budesonide-Formoterol Fumarate Dihyd Aerosol 80-4.5 MCG/ACT: RESPIRATORY_TRACT | 30 days supply | Qty: 10.2 | Fill #4 | Status: AC

## 2020-12-29 ENCOUNTER — Other Ambulatory Visit (HOSPITAL_BASED_OUTPATIENT_CLINIC_OR_DEPARTMENT_OTHER): Payer: Self-pay

## 2020-12-30 ENCOUNTER — Other Ambulatory Visit (HOSPITAL_BASED_OUTPATIENT_CLINIC_OR_DEPARTMENT_OTHER): Payer: Self-pay

## 2020-12-31 ENCOUNTER — Inpatient Hospital Stay: Payer: PPO | Admitting: Hematology & Oncology

## 2020-12-31 ENCOUNTER — Inpatient Hospital Stay: Payer: PPO

## 2021-01-12 ENCOUNTER — Other Ambulatory Visit: Payer: Self-pay | Admitting: Family Medicine

## 2021-01-12 ENCOUNTER — Telehealth: Payer: Self-pay | Admitting: Family Medicine

## 2021-01-12 ENCOUNTER — Other Ambulatory Visit (HOSPITAL_BASED_OUTPATIENT_CLINIC_OR_DEPARTMENT_OTHER): Payer: Self-pay

## 2021-01-12 ENCOUNTER — Encounter: Payer: Self-pay | Admitting: Family Medicine

## 2021-01-12 DIAGNOSIS — U071 COVID-19: Secondary | ICD-10-CM

## 2021-01-12 MED ORDER — CEFDINIR 300 MG PO CAPS: 300.0000 mg | ORAL_CAPSULE | Freq: Two times a day (BID) | ORAL | 0 refills | Status: DC

## 2021-01-12 NOTE — Telephone Encounter (Signed)
error 

## 2021-01-27 ENCOUNTER — Inpatient Hospital Stay: Payer: PPO

## 2021-01-27 ENCOUNTER — Inpatient Hospital Stay: Payer: PPO | Admitting: Hematology & Oncology

## 2021-02-08 ENCOUNTER — Other Ambulatory Visit: Payer: Self-pay | Admitting: Family Medicine

## 2021-02-10 ENCOUNTER — Other Ambulatory Visit (HOSPITAL_BASED_OUTPATIENT_CLINIC_OR_DEPARTMENT_OTHER): Payer: Self-pay

## 2021-02-10 MED ORDER — BUDESONIDE-FORMOTEROL FUMARATE 80-4.5 MCG/ACT IN AERO
INHALATION_SPRAY | RESPIRATORY_TRACT | 5 refills | Status: DC
  Filled 2021-02-10: qty 10.2, 30d supply, fill #0
  Filled 2021-03-24: qty 10.2, 30d supply, fill #1
  Filled 2021-06-02: qty 10.2, 30d supply, fill #2
  Filled 2021-07-29: qty 10.2, 30d supply, fill #3
  Filled 2021-09-18 – 2021-10-06 (×2): qty 10.2, 30d supply, fill #4
  Filled 2021-11-03: qty 10.2, 30d supply, fill #5

## 2021-02-18 ENCOUNTER — Inpatient Hospital Stay: Payer: PPO

## 2021-02-18 ENCOUNTER — Other Ambulatory Visit: Payer: Self-pay | Admitting: Hematology & Oncology

## 2021-02-18 ENCOUNTER — Ambulatory Visit: Payer: PPO | Admitting: Hematology & Oncology

## 2021-02-18 ENCOUNTER — Encounter: Payer: Self-pay | Admitting: *Deleted

## 2021-02-18 DIAGNOSIS — Z9889 Other specified postprocedural states: Secondary | ICD-10-CM

## 2021-02-18 NOTE — Progress Notes (Signed)
Patient was to be seen today, however she rescheduled her appointment to February. Review of chart shows patient is stable on current treatment. Will discontinue active navigation.   Oncology Nurse Navigator Documentation  Oncology Nurse Navigator Flowsheets 02/18/2021  Abnormal Finding Date -  Confirmed Diagnosis Date -  Diagnosis Status -  Planned Course of Treatment -  Phase of Treatment -  Radiation Actual Start Date: -  Radiation Expected End Date: -  Expected Surgery Date -  Surgery Actual Start Date: -  Navigator Follow Up Date: -  Navigator Follow Up Reason: -  Navigation Complete Date: 02/18/2021  Post Navigation: Continue to Follow Patient? No  Reason Not Navigating Patient: No Barriers Identified  Navigator Location [facility]  Referral Date to RadOnc/MedOnc -  Navigator Encounter Type Appt/Treatment Plan Review  Telephone -  Patient Visit Type MedOnc  Treatment Phase Active Tx  Barriers/Navigation Needs No Barriers At This Time  Education -  Interventions None Required  Acuity Level 1-No Barriers  Coordination of Care -  Education Method -  Support Groups/Services Friends and Family  Time Spent with Patient 15

## 2021-02-23 ENCOUNTER — Ambulatory Visit: Payer: PPO

## 2021-03-09 ENCOUNTER — Other Ambulatory Visit (HOSPITAL_BASED_OUTPATIENT_CLINIC_OR_DEPARTMENT_OTHER): Payer: Self-pay

## 2021-03-23 ENCOUNTER — Other Ambulatory Visit (HOSPITAL_BASED_OUTPATIENT_CLINIC_OR_DEPARTMENT_OTHER): Payer: Self-pay

## 2021-03-24 ENCOUNTER — Other Ambulatory Visit (HOSPITAL_BASED_OUTPATIENT_CLINIC_OR_DEPARTMENT_OTHER): Payer: Self-pay

## 2021-03-24 ENCOUNTER — Inpatient Hospital Stay: Payer: PPO | Admitting: Hematology & Oncology

## 2021-03-24 ENCOUNTER — Encounter: Payer: Self-pay | Admitting: Hematology & Oncology

## 2021-03-24 ENCOUNTER — Inpatient Hospital Stay: Payer: PPO | Attending: Hematology & Oncology

## 2021-03-24 ENCOUNTER — Other Ambulatory Visit: Payer: Self-pay

## 2021-03-24 VITALS — BP 150/72 | HR 84 | Temp 97.8°F | Resp 18 | Ht 63.0 in | Wt 144.0 lb

## 2021-03-24 DIAGNOSIS — C50912 Malignant neoplasm of unspecified site of left female breast: Secondary | ICD-10-CM | POA: Insufficient documentation

## 2021-03-24 DIAGNOSIS — C50412 Malignant neoplasm of upper-outer quadrant of left female breast: Secondary | ICD-10-CM

## 2021-03-24 DIAGNOSIS — Z170 Estrogen receptor positive status [ER+]: Secondary | ICD-10-CM

## 2021-03-24 DIAGNOSIS — M8580 Other specified disorders of bone density and structure, unspecified site: Secondary | ICD-10-CM

## 2021-03-24 LAB — CMP (CANCER CENTER ONLY)
ALT: 18 U/L (ref 0–44)
AST: 22 U/L (ref 15–41)
Albumin: 4.3 g/dL (ref 3.5–5.0)
Alkaline Phosphatase: 44 U/L (ref 38–126)
Anion gap: 8 (ref 5–15)
BUN: 9 mg/dL (ref 8–23)
CO2: 32 mmol/L (ref 22–32)
Calcium: 9.7 mg/dL (ref 8.9–10.3)
Chloride: 97 mmol/L — ABNORMAL LOW (ref 98–111)
Creatinine: 0.71 mg/dL (ref 0.44–1.00)
GFR, Estimated: >60 mL/min (ref >60)
Glucose, Bld: 100 mg/dL — ABNORMAL HIGH (ref 70–99)
Potassium: 3.5 mmol/L (ref 3.5–5.1)
Sodium: 137 mmol/L (ref 135–145)
Total Bilirubin: 0.6 mg/dL (ref 0.3–1.2)
Total Protein: 6.9 g/dL (ref 6.5–8.1)

## 2021-03-24 LAB — CBC WITH DIFFERENTIAL (CANCER CENTER ONLY)
Abs Immature Granulocytes: 0 10*3/uL (ref 0.00–0.07)
Basophils Absolute: 0 10*3/uL (ref 0.0–0.1)
Basophils Relative: 1 %
Eosinophils Absolute: 0.1 10*3/uL (ref 0.0–0.5)
Eosinophils Relative: 2 %
HCT: 40.6 % (ref 36.0–46.0)
Hemoglobin: 13.7 g/dL (ref 12.0–15.0)
Immature Granulocytes: 0 %
Lymphocytes Relative: 56 %
Lymphs Abs: 2.4 10*3/uL (ref 0.7–4.0)
MCH: 28.5 pg (ref 26.0–34.0)
MCHC: 33.7 g/dL (ref 30.0–36.0)
MCV: 84.4 fL (ref 80.0–100.0)
Monocytes Absolute: 0.5 10*3/uL (ref 0.1–1.0)
Monocytes Relative: 11 %
Neutro Abs: 1.3 10*3/uL — ABNORMAL LOW (ref 1.7–7.7)
Neutrophils Relative %: 30 %
Platelet Count: 211 10*3/uL (ref 150–400)
RBC: 4.81 MIL/uL (ref 3.87–5.11)
RDW: 12.7 % (ref 11.5–15.5)
WBC Count: 4.3 10*3/uL (ref 4.0–10.5)
nRBC: 0 % (ref 0.0–0.2)

## 2021-03-24 LAB — VITAMIN D 25 HYDROXY (VIT D DEFICIENCY, FRACTURES): Vit D, 25-Hydroxy: 135.76 ng/mL — ABNORMAL HIGH (ref 30–100)
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
## 2021-03-26 ENCOUNTER — Other Ambulatory Visit (HOSPITAL_BASED_OUTPATIENT_CLINIC_OR_DEPARTMENT_OTHER): Payer: Self-pay

## 2021-03-26 MED ORDER — VALACYCLOVIR HCL 1 G PO TABS
ORAL_TABLET | ORAL | 1 refills | Status: DC
  Filled 2021-03-26: qty 60, 30d supply, fill #0
  Filled 2021-04-13: qty 60, 30d supply, fill #1

## 2021-03-30 ENCOUNTER — Other Ambulatory Visit: Payer: Self-pay

## 2021-03-30 ENCOUNTER — Ambulatory Visit: Payer: PPO | Attending: General Surgery

## 2021-03-30 VITALS — Wt 148.4 lb

## 2021-03-30 DIAGNOSIS — Z483 Aftercare following surgery for neoplasm: Secondary | ICD-10-CM | POA: Insufficient documentation
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
## 2021-04-09 ENCOUNTER — Other Ambulatory Visit: Payer: Self-pay

## 2021-04-09 ENCOUNTER — Telehealth: Payer: Self-pay | Admitting: *Deleted

## 2021-04-09 ENCOUNTER — Ambulatory Visit (HOSPITAL_BASED_OUTPATIENT_CLINIC_OR_DEPARTMENT_OTHER)
Admission: RE | Admit: 2021-04-09 | Discharge: 2021-04-09 | Disposition: A | Discharge status: Home / Self Care | Payer: PPO | Source: Ambulatory Visit | Attending: Hematology & Oncology | Admitting: Hematology & Oncology

## 2021-04-09 ENCOUNTER — Other Ambulatory Visit (HOSPITAL_BASED_OUTPATIENT_CLINIC_OR_DEPARTMENT_OTHER): Payer: Self-pay

## 2021-04-09 DIAGNOSIS — E119 Type 2 diabetes mellitus without complications: Secondary | ICD-10-CM | POA: Diagnosis not present

## 2021-04-09 DIAGNOSIS — M8580 Other specified disorders of bone density and structure, unspecified site: Secondary | ICD-10-CM | POA: Diagnosis not present

## 2021-04-09 DIAGNOSIS — Z13820 Encounter for screening for osteoporosis: Secondary | ICD-10-CM | POA: Diagnosis not present

## 2021-04-09 DIAGNOSIS — Z780 Asymptomatic menopausal state: Secondary | ICD-10-CM | POA: Diagnosis not present

## 2021-04-09 MED ORDER — NP THYROID 90 MG PO TABS
ORAL_TABLET | ORAL | 3 refills | Status: AC
  Filled 2021-04-09: qty 45, 90d supply, fill #0

## 2021-04-09 NOTE — Telephone Encounter (Signed)
Pt notified per order of Dr. Marin Olp "that the bone density test looks okay.  She has good bone density.  I am not surprised by this.  Pete"  Pt appreciative of call and has no questions at this time.

## 2021-04-09 NOTE — Telephone Encounter (Signed)
-----   Message from Volanda Napoleon, MD sent at 04/09/2021  2:36 PM EST ----- Please call her and let her know that the bone density test looks okay.  She has good bone density.  I am not surprised by this.  Laurey Arrow

## 2021-04-13 ENCOUNTER — Other Ambulatory Visit (HOSPITAL_BASED_OUTPATIENT_CLINIC_OR_DEPARTMENT_OTHER): Payer: Self-pay

## 2021-04-15 ENCOUNTER — Ambulatory Visit
Admission: RE | Admit: 2021-04-15 | Discharge: 2021-04-15 | Disposition: A | Discharge status: Home / Self Care | Payer: PPO | Source: Ambulatory Visit | Attending: Hematology & Oncology | Admitting: Hematology & Oncology

## 2021-04-15 DIAGNOSIS — R922 Inconclusive mammogram: Secondary | ICD-10-CM | POA: Diagnosis not present

## 2021-04-15 DIAGNOSIS — Z853 Personal history of malignant neoplasm of breast: Secondary | ICD-10-CM | POA: Diagnosis not present

## 2021-04-15 DIAGNOSIS — Z9889 Other specified postprocedural states: Secondary | ICD-10-CM

## 2021-05-14 ENCOUNTER — Encounter: Payer: PPO | Admitting: Family Medicine

## 2021-05-19 ENCOUNTER — Encounter: Payer: PPO | Admitting: Family Medicine

## 2021-06-02 ENCOUNTER — Other Ambulatory Visit (HOSPITAL_BASED_OUTPATIENT_CLINIC_OR_DEPARTMENT_OTHER): Payer: Self-pay

## 2021-06-02 ENCOUNTER — Other Ambulatory Visit: Payer: Self-pay | Admitting: Family Medicine

## 2021-06-02 DIAGNOSIS — E785 Hyperlipidemia, unspecified: Secondary | ICD-10-CM

## 2021-06-02 DIAGNOSIS — Z923 Personal history of irradiation: Secondary | ICD-10-CM

## 2021-06-02 DIAGNOSIS — E1169 Type 2 diabetes mellitus with other specified complication: Secondary | ICD-10-CM

## 2021-06-02 DIAGNOSIS — I10 Essential (primary) hypertension: Secondary | ICD-10-CM

## 2021-06-02 MED ORDER — AMLODIPINE BESYLATE 5 MG PO TABS
ORAL_TABLET | Freq: Every day | ORAL | 1 refills | Status: DC
  Filled 2021-06-02: qty 90, 90d supply, fill #0
  Filled 2021-09-08: qty 90, 90d supply, fill #1

## 2021-06-02 MED ORDER — SIMVASTATIN 20 MG PO TABS
ORAL_TABLET | ORAL | 1 refills | Status: DC
  Filled 2021-06-02: qty 90, 90d supply, fill #0
  Filled 2021-09-08: qty 90, 90d supply, fill #1

## 2021-06-02 MED ORDER — HYDROCHLOROTHIAZIDE 25 MG PO TABS
ORAL_TABLET | Freq: Every day | ORAL | 1 refills | Status: DC
  Filled 2021-06-02: qty 90, 90d supply, fill #0
  Filled 2021-09-08: qty 90, 90d supply, fill #1

## 2021-06-11 DIAGNOSIS — H524 Presbyopia: Secondary | ICD-10-CM | POA: Diagnosis not present

## 2021-06-11 DIAGNOSIS — E7800 Pure hypercholesterolemia, unspecified: Secondary | ICD-10-CM | POA: Diagnosis not present

## 2021-06-11 DIAGNOSIS — H5203 Hypermetropia, bilateral: Secondary | ICD-10-CM | POA: Diagnosis not present

## 2021-06-11 DIAGNOSIS — D3131 Benign neoplasm of right choroid: Secondary | ICD-10-CM | POA: Diagnosis not present

## 2021-06-11 DIAGNOSIS — I10 Essential (primary) hypertension: Secondary | ICD-10-CM | POA: Diagnosis not present

## 2021-06-11 DIAGNOSIS — E119 Type 2 diabetes mellitus without complications: Secondary | ICD-10-CM | POA: Diagnosis not present

## 2021-06-11 DIAGNOSIS — H35033 Hypertensive retinopathy, bilateral: Secondary | ICD-10-CM | POA: Diagnosis not present

## 2021-06-11 DIAGNOSIS — H25812 Combined forms of age-related cataract, left eye: Secondary | ICD-10-CM | POA: Diagnosis not present

## 2021-06-11 DIAGNOSIS — H52223 Regular astigmatism, bilateral: Secondary | ICD-10-CM | POA: Diagnosis not present

## 2021-06-11 LAB — HM DIABETES EYE EXAM

## 2021-06-16 ENCOUNTER — Encounter: Payer: Self-pay | Admitting: Family Medicine

## 2021-06-16 ENCOUNTER — Telehealth: Payer: Self-pay | Admitting: *Deleted

## 2021-06-16 ENCOUNTER — Ambulatory Visit (INDEPENDENT_AMBULATORY_CARE_PROVIDER_SITE_OTHER): Payer: PPO | Admitting: Family Medicine

## 2021-06-16 VITALS — BP 130/70 | HR 77 | Temp 97.8°F | Resp 18 | Ht 63.0 in | Wt 151.8 lb

## 2021-06-16 DIAGNOSIS — E786 Lipoprotein deficiency: Secondary | ICD-10-CM | POA: Diagnosis not present

## 2021-06-16 DIAGNOSIS — Z170 Estrogen receptor positive status [ER+]: Secondary | ICD-10-CM

## 2021-06-16 DIAGNOSIS — E785 Hyperlipidemia, unspecified: Secondary | ICD-10-CM

## 2021-06-16 DIAGNOSIS — M65312 Trigger thumb, left thumb: Secondary | ICD-10-CM

## 2021-06-16 DIAGNOSIS — E559 Vitamin D deficiency, unspecified: Secondary | ICD-10-CM | POA: Diagnosis not present

## 2021-06-16 DIAGNOSIS — Z23 Encounter for immunization: Secondary | ICD-10-CM

## 2021-06-16 DIAGNOSIS — Z0000 Encounter for general adult medical examination without abnormal findings: Secondary | ICD-10-CM

## 2021-06-16 DIAGNOSIS — E1165 Type 2 diabetes mellitus with hyperglycemia: Secondary | ICD-10-CM

## 2021-06-16 DIAGNOSIS — C50412 Malignant neoplasm of upper-outer quadrant of left female breast: Secondary | ICD-10-CM | POA: Diagnosis not present

## 2021-06-16 DIAGNOSIS — I10 Essential (primary) hypertension: Secondary | ICD-10-CM

## 2021-06-16 DIAGNOSIS — E039 Hypothyroidism, unspecified: Secondary | ICD-10-CM

## 2021-06-16 DIAGNOSIS — E1169 Type 2 diabetes mellitus with other specified complication: Secondary | ICD-10-CM

## 2021-06-16 LAB — CBC WITH DIFFERENTIAL/PLATELET
Basophils Absolute: 0.1 10*3/uL (ref 0.0–0.1)
Basophils Relative: 1.1 % (ref 0.0–3.0)
Eosinophils Absolute: 0.4 10*3/uL (ref 0.0–0.7)
Eosinophils Relative: 6 % — ABNORMAL HIGH (ref 0.0–5.0)
HCT: 40.3 % (ref 36.0–46.0)
Hemoglobin: 13.3 g/dL (ref 12.0–15.0)
Lymphocytes Relative: 33.9 % (ref 12.0–46.0)
Lymphs Abs: 2 10*3/uL (ref 0.7–4.0)
MCHC: 33.1 g/dL (ref 30.0–36.0)
MCV: 91.7 fl (ref 78.0–100.0)
Monocytes Absolute: 0.5 10*3/uL (ref 0.1–1.0)
Monocytes Relative: 8.5 % (ref 3.0–12.0)
Neutro Abs: 3 10*3/uL (ref 1.4–7.7)
Neutrophils Relative %: 50.5 % (ref 43.0–77.0)
Platelets: 294 10*3/uL (ref 150.0–400.0)
RBC: 4.39 Mil/uL (ref 3.87–5.11)
RDW: 16.2 % — ABNORMAL HIGH (ref 11.5–15.5)
WBC: 6 10*3/uL (ref 4.0–10.5)

## 2021-06-16 LAB — LIPID PANEL
Cholesterol: 148 mg/dL (ref 0–200)
HDL: 65.2 mg/dL (ref >39.00)
LDL Cholesterol: 68 mg/dL (ref 0–99)
NonHDL: 82.68
Total CHOL/HDL Ratio: 2
Triglycerides: 72 mg/dL (ref 0.0–149.0)
VLDL: 14.4 mg/dL (ref 0.0–40.0)

## 2021-06-16 LAB — COMPREHENSIVE METABOLIC PANEL
ALT: 16 U/L (ref 0–35)
AST: 16 U/L (ref 0–37)
Albumin: 4.6 g/dL (ref 3.5–5.2)
Alkaline Phosphatase: 45 U/L (ref 39–117)
BUN: 15 mg/dL (ref 6–23)
CO2: 30 mEq/L (ref 19–32)
Calcium: 9.8 mg/dL (ref 8.4–10.5)
Chloride: 95 mEq/L — ABNORMAL LOW (ref 96–112)
Creatinine, Ser: 0.77 mg/dL (ref 0.40–1.20)
GFR: 79.35 mL/min (ref >60.00)
Glucose, Bld: 94 mg/dL (ref 70–99)
Potassium: 4 mEq/L (ref 3.5–5.1)
Sodium: 133 mEq/L — ABNORMAL LOW (ref 135–145)
Total Bilirubin: 0.9 mg/dL (ref 0.2–1.2)
Total Protein: 7.1 g/dL (ref 6.0–8.3)

## 2021-06-16 LAB — MICROALBUMIN / CREATININE URINE RATIO
Creatinine,U: 32 mg/dL
Microalb Creat Ratio: 2.8 mg/g (ref 0.0–30.0)
Microalb, Ur: 0.9 mg/dL (ref 0.0–1.9)

## 2021-06-16 LAB — VITAMIN D 25 HYDROXY (VIT D DEFICIENCY, FRACTURES): VITD: 116.56 ng/mL (ref 30.00–100.00)

## 2021-06-16 LAB — HEMOGLOBIN A1C: Hgb A1c MFr Bld: 5.7 % (ref 4.6–6.5)

## 2021-06-16 NOTE — Assessment & Plan Note (Signed)
ghm utd Check labs  See avs  

## 2021-06-16 NOTE — Assessment & Plan Note (Signed)
Encourage heart healthy diet such as MIND or DASH diet, increase exercise, avoid trans fats, simple carbohydrates and processed foods, consider a krill or fish or flaxseed oil cap daily.  °

## 2021-06-16 NOTE — Progress Notes (Signed)
? ?Subjective:  ? ?By signing my name below, I, Michelle [name], attest that this documentation has been prepared under the direction and in the presence of Michelle Held, DO. 06/16/2021 ? ? Patient ID: Michelle [name], female    DOB: 24-Jul-1955, 66 y.o.   MRN: [id number] ? ?Chief Complaint  ?Patient presents with  ? Annual Exam  ?  Pt states fasting   ? ? ?HPI ?Patient is in today for a comprehensive physical exam. ? ?She complains of trigger finger in her left thumb. Started after she came back from her trip where she was lifting heavy.  ? ?She travels up to [place] frequently to take care of her mother. ? ?She had lab work in February.  ? ?She will be getting a cataract surgery. ? ?She plans on  starting regular exercise and following a regular diet since she came back. ? ?She is UTD on dental and vision ? ?She denies fever, hearing loss, ear pain,congestion, sinus pain, sore throat, eye pain, chest pain, palpitations, cough, shortness of breath, wheezing, nausea. vomiting, diarrhea, constipation, blood in stool, dysuria,frequency, hematuria and headaches.  ? ?She has 2 Covid-19 vaccines at this time. She will receive the pneumonia vaccine today. ? ?Her brother just had triple bypass surgery. ? ?Past Medical History:  ?Diagnosis Date  ? Allergy   ? Asthma   ? Bronchitis   ? Cancer [facility])   ? COPD (chronic obstructive pulmonary disease) ([place])   ? mild  ? Diabetes mellitus without complication ([place])   ? [place] Patient infection   ? Goals of care, counseling/discussion 06/30/2020  ? History of hiatal hernia   ? History of kidney stones   ? Hyperlipidemia   ? Hypertension   ? stress induced/ work makes it worse.  ? Hypothyroidism   ? Neuromuscular disorder ([place])   ? Neuropathy  ? Obesity   ? Pneumonia   ? Thyroid disease   ? ? ?Past Surgical History:  ?Procedure Laterality Date  ? BREAST BIOPSY    ? BREAST EXCISIONAL BIOPSY Left 2022  ? BREAST LUMPECTOMY WITH RADIOACTIVE SEED AND SENTINEL LYMPH NODE BIOPSY Left  06/11/2020  ? Procedure: LEFT BREAST LUMPECTOMY WITH RADIOACTIVE SEED AND LEFT AXILLARY SENTINEL LYMPH NODE BIOPSY;  Surgeon: Rolm Bookbinder, MD;  Location: [facility];  Service: General;  Laterality: Left;  ? TONSILLECTOMY AND ADENOIDECTOMY  childhood  ? TUBAL LIGATION    ? VAGINAL HYSTERECTOMY  2011  ? ? ?Family History  ?Problem Relation Age of Onset  ? Heart disease Mother   ? Hyperlipidemia Mother   ? Stroke Mother   ? Hypertension Mother   ? Heart disease Father   ? Colon polyps Brother   ? Coronary artery disease Brother   ? Prostate cancer Brother   ? Colon cancer Neg Hx   ? Stomach cancer Neg Hx   ? Rectal cancer Neg Hx   ? Esophageal cancer Neg Hx   ? ? ?Social History  ? ?Socioeconomic History  ? Marital status: Married  ?  Spouse name: Not on file  ? Number of children: Not on file  ? Years of education: Not on file  ? Highest education level: Not on file  ?Occupational History  ? Occupation: Therapist, sports  ?Tobacco Use  ? Smoking status: Never  ? Smokeless tobacco: Never  ?Vaping Use  ? Vaping Use: Never used  ?Substance and Sexual Activity  ? Alcohol use: No  ? Drug use: No  ? Sexual activity: Yes  ?  Other Topics Concern  ? Not on file  ?Social History Narrative  ? Exercise- Walks about an hour a day  ? Right handed  ? Lives husband one story  ? ?Social Determinants of Health  ? ?Financial Resource Strain: Low Risk   ? Difficulty of Paying Living Expenses: Not hard at all  ?Food Insecurity: No Food Insecurity  ? Worried About Charity fundraiser in the Last Year: Never true  ? Ran Out of Food in the Last Year: Never true  ?Transportation Needs: No Transportation Needs  ? Lack of Transportation (Medical): No  ? Lack of Transportation (Non-Medical): No  ?Physical Activity: Sufficiently Active  ? Days of Exercise per Week: 7 days  ? Minutes of Exercise per Session: 30 min  ?Stress: Stress Concern Present  ? Feeling of Stress : To some extent  ?Social Connections: Socially Integrated  ? Frequency of Communication with  Friends and Family: More than three times a week  ? Frequency of Social Gatherings with Friends and Family: More than three times a week  ? Attends Religious Services: 1 to 4 times per year  ? Active Member of Clubs or Organizations: Yes  ? Attends Archivist Meetings: 1 to 4 times per year  ? Marital Status: Married  ?Intimate Partner Violence: Not on file  ? ? ?Outpatient Medications Prior to Visit  ?Medication Sig Dispense Refill  ? ALBUTEROL IN     ? Alpha-Lipoic Acid 600 MG TABS Take 600 mg by mouth daily.    ? amLODipine (NORVASC) 5 MG tablet TAKE 1 TABLET (5 MG TOTAL) BY MOUTH DAILY. 90 tablet 1  ? Ascorbic Acid (VITAMIN C) 1000 MG tablet Take 1,000 mg by mouth 2 (two) times daily.    ? ASPIRIN 81 PO Take by mouth.    ? blood glucose meter kit and supplies KIT As directed 1 each 0  ? Blood Glucose Monitoring Suppl (FREESTYLE LITE) w/Device KIT Use as directed 1 kit 0  ? budesonide-formoterol (SYMBICORT) 80-4.5 MCG/ACT inhaler INHALE 2 PUFFS BY MOUTH INTO THE LUNGS IN THE MORNING AND AT BEDTIME. 10.2 g 5  ? Calcium-Magnesium (CAL-MAG PO) Take 1 tablet by mouth 3 (three) times daily.    ? Cholecalciferol (DIALYVITE VITAMIN D 5000) 125 MCG (5000 UT) capsule Take 5,000 Units by mouth daily.    ? CHROMIUM PO Take 2 tablets by mouth 2 (two) times daily.    ? CINNAMON PO Take 125 mg by mouth 3 (three) times daily.    ? co-enzyme Q-10 50 MG capsule Take 100 mg by mouth daily.    ? Digestive Enzyme CAPS Take 1 capsule by mouth 3 (three) times daily with meals.    ? fluticasone (FLONASE) 50 MCG/ACT nasal spray Place 1 spray into both nostrils 2 (two) times daily.    ? GARLIC PO Take 1 tablet by mouth 3 (three) times daily.    ? glucose blood (FREESTYLE LITE) test strip Test blood sugars 3 times daily 100 each 2  ? hydrochlorothiazide (HYDRODIURIL) 25 MG tablet TAKE 1 TABLET (25 MG TOTAL) BY MOUTH DAILY. 90 tablet 1  ? Lancets (FREESTYLE) lancets Use as directed 100 each 0  ? loratadine (CLARITIN) 10 MG tablet  Take 10 mg by mouth at bedtime.    ? Melatonin 10 MG CAPS Take 20 mg by mouth at bedtime.    ? Menaquinone-7 (VITAMIN K2 PO) Take 1 tablet by mouth daily.    ? metFORMIN (GLUCOPHAGE-XR) 500 MG 24 hr  tablet Take 1 tablet (500 mg total) by mouth 2 (two) times daily. 180 tablet 1  ? Methylcobalamin 50000 MCG SOLR Inject 5,000 mcg as directed 2 (two) times a week. Tuesdays and Saturdays    ? MULTIPLE VITAMIN PO Take 2 capsules by mouth in the morning, at noon, and at bedtime.    ? NALTREXONE HCL PO Take 3 mg by mouth at bedtime. Natrexone er 1.5 mg capsules x 2    ? Omega-3 Fatty Acids (THE VERY FINEST FISH OIL) LIQD Take 15 mLs by mouth daily.    ? POTASSIUM IODIDE PO Take 225 mcg by mouth 3 (three) times daily.    ? Probiotic Product (PROBIOTIC PO) Take 1 capsule by mouth at bedtime.    ? QUERCETIN PO Take 1 tablet by mouth 3 (three) times daily.    ? simvastatin (ZOCOR) 20 MG tablet TAKE 1 TABLET (20 MG TOTAL) BY MOUTH DAILY AT 6 PM. 90 tablet 1  ? thyroid (NP THYROID) 90 MG tablet Take 1/2 tablet by mouth daily 100 tablet 3  ? TURMERIC PO Take 1,500 mg by mouth 2 (two) times daily.    ? VITAMIN A PO Take 20,000 Units by mouth daily.    ? VITAMIN E PO Take 260 mg by mouth daily.    ? Zinc 30 MG CAPS Take 30 mg by mouth 2 (two) times daily.    ? cefdinir (OMNICEF) 300 MG capsule Take 1 capsule (300 mg total) by mouth 2 (two) times daily. 14 capsule 0  ? Levomefolic Acid (5-MTHF PO) Take 1,000 mcg by mouth daily.    ? thyroid (ARMOUR) 90 MG tablet Take 45 mg by mouth daily.    ? valACYclovir (VALTREX) 1000 MG tablet Take 1 tablet by mouth 2 times daily 60 tablet 1  ? ?No facility-administered medications prior to visit.  ? ? ?Allergies  ?Allergen Reactions  ? Influenza Vaccine Live Other (See Comments)  ?  Sever flu symptoms, chest tightness, wheezing  ? Shrimp [Shellfish Allergy] Itching  ?  Allergic to shrimp.  Wheezing and tightness in chest.  ? Tdap [Tetanus-Diphth-Acell Pertussis] Other (See Comments)  ?  Change  in level of consciousness, altered mental state  ? Demerol [Meperidine] Nausea And Vomiting  ? Peanut-Containing Drug Products Other (See Comments)  ?  wheezing  ? Chlorhexidine Gluconate [Chlorhexidine

## 2021-06-16 NOTE — Telephone Encounter (Signed)
CRITICAL VALUE STICKER ? ?CRITICAL VALUE:  Vitamin D  116.56 ? ?RECEIVER (on-site recipient of call):  Kelle Darting, [place] ? ?DATE & TIME NOTIFIED: 06/16/21  @ 2:22pm ? ?MESSENGER (representative from lab):  Hope ? ?MD NOTIFIED: Carollee Herter ? ?TIME OF NOTIFICATION: 2:31pm ? ?RESPONSE:   ? ?

## 2021-06-16 NOTE — Assessment & Plan Note (Signed)
Per oncology °

## 2021-06-16 NOTE — Assessment & Plan Note (Signed)
hgba1c to be checked, minimize simple carbs. Increase exercise as tolerated. Continue current meds  

## 2021-06-16 NOTE — Patient Instructions (Signed)
Preventive Care 7 Years and Older, Female ?Preventive care refers to lifestyle choices and visits with your health care provider that can promote health and wellness. Preventive care visits are also called wellness exams. ?What can I expect for my preventive care visit? ?Counseling ?Your health care provider may ask you questions about your: ?Medical history, including: ?Past medical problems. ?Family medical history. ?Pregnancy and menstrual history. ?History of falls. ?Current health, including: ?Memory and ability to understand (cognition). ?Emotional well-being. ?Home life and relationship well-being. ?Sexual activity and sexual health. ?Lifestyle, including: ?Alcohol, nicotine or tobacco, and drug use. ?Access to firearms. ?Diet, exercise, and sleep habits. ?Work and work Statistician. ?Sunscreen use. ?Safety issues such as seatbelt and bike helmet use. ?Physical exam ?Your health care provider will check your: ?Height and weight. These may be used to calculate your BMI (body mass index). BMI is a measurement that tells if you are at a healthy weight. ?Waist circumference. This measures the distance around your waistline. This measurement also tells if you are at a healthy weight and may help predict your risk of certain diseases, such as type 2 diabetes and high blood pressure. ?Heart rate and blood pressure. ?Body temperature. ?Skin for abnormal spots. ?What immunizations do I need? ? ?Vaccines are usually given at various ages, according to a schedule. Your health care provider will recommend vaccines for you based on your age, medical history, and lifestyle or other factors, such as travel or where you work. ?What tests do I need? ?Screening ?Your health care provider may recommend screening tests for certain conditions. This may include: ?Lipid and cholesterol levels. ?Hepatitis C test. ?Hepatitis B test. ?HIV (human immunodeficiency virus) test. ?STI (sexually transmitted infection) testing, if you are at  risk. ?Lung cancer screening. ?Colorectal cancer screening. ?Diabetes screening. This is done by checking your blood sugar (glucose) after you have not eaten for a while (fasting). ?Mammogram. Talk with your health care provider about how often you should have regular mammograms. ?BRCA-related cancer screening. This may be done if you have a family history of breast, ovarian, tubal, or peritoneal cancers. ?Bone density scan. This is done to screen for osteoporosis. ?Talk with your health care provider about your test results, treatment options, and if necessary, the need for more tests. ?Follow these instructions at home: ?Eating and drinking ? ?Eat a diet that includes fresh fruits and vegetables, whole grains, lean protein, and low-fat dairy products. Limit your intake of foods with high amounts of sugar, saturated fats, and salt. ?Take vitamin and mineral supplements as recommended by your health care provider. ?Do not drink alcohol if your health care provider tells you not to drink. ?If you drink alcohol: ?Limit how much you have to 0-1 drink a day. ?Know how much alcohol is in your drink. In the [place] one drink equals one 12 oz bottle of beer (355 mL), one 5 oz glass of wine (148 mL), or one 1? oz glass of hard liquor (44 mL). ?Lifestyle ?Brush your teeth every morning and night with fluoride toothpaste. Floss one time each day. ?Exercise for at least 30 minutes 5 or more days each week. ?Do not use any products that contain nicotine or tobacco. These products include cigarettes, chewing tobacco, and vaping devices, such as e-cigarettes. If you need help quitting, ask your health care provider. ?Do not use drugs. ?If you are sexually active, practice safe sex. Use a condom or other form of protection in order to prevent STIs. ?Take aspirin only as told by  your health care provider. Make sure that you understand how much to take and what form to take. Work with your health care provider to find out whether it  is safe and beneficial for you to take aspirin daily. ?Ask your health care provider if you need to take a cholesterol-lowering medicine (statin). ?Find healthy ways to manage stress, such as: ?Meditation, yoga, or listening to music. ?Journaling. ?Talking to a trusted person. ?Spending time with friends and family. ?Minimize exposure to UV radiation to reduce your risk of skin cancer. ?Safety ?Always wear your seat belt while driving or riding in a vehicle. ?Do not drive: ?If you have been drinking alcohol. Do not ride with someone who has been drinking. ?When you are tired or distracted. ?While texting. ?If you have been using any mind-altering substances or drugs. ?Wear a helmet and other protective equipment during sports activities. ?If you have firearms in your house, make sure you follow all gun safety procedures. ?What's next? ?Visit your health care provider once a year for an annual wellness visit. ?Ask your health care provider how often you should have your eyes and teeth checked. ?Stay up to date on all vaccines. ?This information is not intended to replace advice given to you by your health care provider. Make sure you discuss any questions you have with your health care provider. ?Document Revised: 07/30/2020 Document Reviewed: 07/30/2020 ?Elsevier Patient Education ? [place]. ? ?

## 2021-06-16 NOTE — Assessment & Plan Note (Signed)
Check labs con't synthroid 

## 2021-06-17 NOTE — Telephone Encounter (Signed)
Pt viewed labs via Mychart ?

## 2021-06-18 ENCOUNTER — Encounter: Payer: Self-pay | Admitting: Family Medicine

## 2021-06-22 ENCOUNTER — Encounter: Payer: Self-pay | Admitting: Internal Medicine

## 2021-06-22 ENCOUNTER — Ambulatory Visit: Payer: PPO | Admitting: Internal Medicine

## 2021-06-22 ENCOUNTER — Ambulatory Visit: Payer: PPO | Attending: General Surgery

## 2021-06-22 VITALS — BP 138/60 | HR 94 | Ht 63.0 in | Wt 154.6 lb

## 2021-06-22 VITALS — Wt 156.5 lb

## 2021-06-22 DIAGNOSIS — Z483 Aftercare following surgery for neoplasm: Secondary | ICD-10-CM | POA: Insufficient documentation

## 2021-06-22 DIAGNOSIS — I10 Essential (primary) hypertension: Secondary | ICD-10-CM

## 2021-06-22 NOTE — Patient Instructions (Signed)
Medication Instructions:  ?Your physician recommends that you continue on your current medications as directed. Please refer to the Current Medication list given to you today. ? ?Follow-Up: ?At [facility], you and your health needs are our priority.  As part of our continuing mission to provide you with exceptional heart care, we have created designated Provider Care Teams.  These Care Teams include your primary Cardiologist (physician) and Advanced Practice Providers (APPs -  Physician Assistants and Nurse Practitioners) who all work together to provide you with the care you need, when you need it. ? ?We recommend signing up for the patient portal called "MyChart".  Sign up information is provided on this After Visit Summary.  MyChart is used to connect with patients for Virtual Visits (Telemedicine).  Patients are able to view lab/test results, encounter notes, upcoming appointments, etc.  Non-urgent messages can be sent to your provider as well.   ?To learn more about what you can do with MyChart, go to [email or web address].   ? ?Your next appointment:   ?AS NEEDED with Dr. Harl Bowie  ? ? ? ?Important Information About Sugar ? ? ? ? ? ? ?

## 2021-06-22 NOTE — Progress Notes (Signed)
?Cardiology Office Note:   ? ?Date:  06/22/2021  ? ?ID:  Michelle [name], DOB August 20, 1955, MRN [id number] ? ?PCP:  [name], DO ?  ?[facility] Providers ?Cardiologist:  None    ? ?Referring MD: Carollee Herter, Alferd Apa, *  ? ?No chief complaint on file. ?HTN/Family Hx ? ?History of Present Illness:   ? ?Michelle [name] is a 66 y.o. female with a hx of asthma, DM2, hypothyroidism, Stage 1A ER+/PR+ HER2- L breast cancer, HLD referral from for family hx and HTN ? ?Family hx notable for brother who has recent CABG, he was otherwise healthy.  Parents have hx of stroke, bypass and afib as well as CHF. Her father had valves replaced. ? ?She occasionally she goes to [place] to see her mother. She typically walks and goes to the gym. She denies SOB and chest pressure.  ? ?Bp at home are typically in the 120s. She eats organic and salads.  ? ?Currently she has denied starting AI. S/p XRT to the left breast. ? ?The 10-year ASCVD risk score (Arnett DK, et al., 2019) is: 16.6% ?  Values used to calculate the score: ?    Age: 62 years ?    Sex: Female ?    Is Non-Hispanic African American: No ?    Diabetic: Yes ?    Tobacco smoker: No ?    Systolic Blood Pressure: 742 mmHg ?    Is BP treated: Yes ?    HDL Cholesterol: 65.2 mg/dL ?    Total Cholesterol: 148 mg/dL ? ? 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 ?  ?Current Therapy:        ?Status post left lumpectomy on 06/11/2020 ?Status post radiation therapy-completed on 08/19/2020 ?Femara 2.5 mg p.o. daily -she has not yet started ? ?Past Medical History:  ?Diagnosis Date  ? Allergy   ? Asthma   ? Bronchitis   ? Cancer [facility])   ? COPD (chronic obstructive pulmonary disease) ([place])   ? mild  ? Diabetes mellitus without complication ([place])   ? [place]ell Patient infection   ? Goals of care, counseling/discussion 06/30/2020  ? History of hiatal hernia   ? History of kidney  stones   ? Hyperlipidemia   ? Hypertension   ? stress induced/ work makes it worse.  ? Hypothyroidism   ? Neuromuscular disorder ([place])   ? Neuropathy  ? Obesity   ? Pneumonia   ? Thyroid disease   ? ? ?Past Surgical History:  ?Procedure Laterality Date  ? BREAST BIOPSY    ? BREAST EXCISIONAL BIOPSY Left 2022  ? BREAST LUMPECTOMY WITH RADIOACTIVE SEED AND SENTINEL LYMPH NODE BIOPSY Left 06/11/2020  ? Procedure: LEFT BREAST LUMPECTOMY WITH RADIOACTIVE SEED AND LEFT AXILLARY SENTINEL LYMPH NODE BIOPSY;  Surgeon: Rolm Bookbinder, MD;  Location: [place];  Service: General;  Laterality: Left;  ? TONSILLECTOMY AND ADENOIDECTOMY  childhood  ? TUBAL LIGATION    ? VAGINAL HYSTERECTOMY  2011  ? ? ?Current Medications: ?No outpatient medications have been marked as taking for the 06/22/21 encounter (Appointment) with Janina Mayo, MD.  ?  ? ?Allergies:   Influenza vaccine live, Shrimp [shellfish allergy], Tdap [tetanus-diphth-acell pertussis], Demerol [meperidine], Peanut-containing drug products, Chlorhexidine gluconate [chlorhexidine], and Latex  ? ?Social History  ? ?Socioeconomic History  ? Marital status: Married  ?  Spouse name: Not on file  ? Number  of children: Not on file  ? Years of education: Not on file  ? Highest education level: Not on file  ?Occupational History  ? Occupation: Therapist, sports  ?Tobacco Use  ? Smoking status: Never  ? Smokeless tobacco: Never  ?Vaping Use  ? Vaping Use: Never used  ?Substance and Sexual Activity  ? Alcohol use: No  ? Drug use: No  ? Sexual activity: Yes  ?Other Topics Concern  ? Not on file  ?Social History Narrative  ? Exercise- Walks about an hour a day  ? Right handed  ? Lives husband one story  ? ?Social Determinants of Health  ? ?Financial Resource Strain: Low Risk   ? Difficulty of Paying Living Expenses: Not hard at all  ?Food Insecurity: No Food Insecurity  ? Worried About Charity fundraiser in the Last Year: Never true  ? Ran Out of Food in the Last Year: Never true  ?Transportation  Needs: No Transportation Needs  ? Lack of Transportation (Medical): No  ? Lack of Transportation (Non-Medical): No  ?Physical Activity: Sufficiently Active  ? Days of Exercise per Week: 7 days  ? Minutes of Exercise per Session: 30 min  ?Stress: Stress Concern Present  ? Feeling of Stress : To some extent  ?Social Connections: Socially Integrated  ? Frequency of Communication with Friends and Family: More than three times a week  ? Frequency of Social Gatherings with Friends and Family: More than three times a week  ? Attends Religious Services: 1 to 4 times per year  ? Active Member of Clubs or Organizations: Yes  ? Attends Archivist Meetings: 1 to 4 times per year  ? Marital Status: Married  ?  ? ?Family History: ?The patient's family history includes Colon polyps in her brother; Coronary artery disease in her brother; Heart disease in her brother, father, and mother; Hyperlipidemia in her mother; Hypertension in her mother; Prostate cancer in her brother; Stroke in her mother. There is no history of Colon cancer, Stomach cancer, Rectal cancer, or Esophageal cancer. ? ?ROS:   ?Please see the history of present illness.    ? All other systems reviewed and are negative. ? ?EKGs/Labs/Other Studies Reviewed:   ? ?The following studies were reviewed today: ? ? ?EKG:  EKG is  ordered today.  The ekg ordered today demonstrates  ? ?06/22/2021-NSR ? ?Recent Labs: ?11/14/2020: TSH 1.28; TSH 1.24 ?06/16/2021: ALT 16; BUN 15; Creatinine, Ser 0.77; Hemoglobin 13.3; Platelets 294.0; Potassium 4.0; Sodium 133  ? ? ?Recent Lipid Panel ?   ?Component Value Date/Time  ? CHOL 148 06/16/2021 1003  ? CHOL 171 08/12/2020 0816  ? TRIG 72.0 06/16/2021 1003  ? HDL 65.20 06/16/2021 1003  ? HDL 67 08/12/2020 0816  ? CHOLHDL 2 06/16/2021 1003  ? VLDL 14.4 06/16/2021 1003  ? [place] 68 06/16/2021 1003  ? [place] 91 08/12/2020 0816  ? [place] 64 11/06/2019 1113  ? ? ? ?Risk Assessment/Calculations:   ?  ? ?    ? ?Physical Exam:   ? ?VS:   ? ?Vitals:  ? 06/22/21 1102  ?BP: 138/60  ?Pulse: 94  ?SpO2: 98%  ? ? ? ?Wt Readings from Last 3 Encounters:  ?06/16/21 151 lb 12.8 oz (68.9 kg)  ?03/30/21 148 lb 6 oz (67.3 kg)  ?03/24/21 144 lb (65.3 kg)  ?  ? ?GEN:  Well nourished, well developed in no acute distress ?HEENT: Normal ?NECK: No JVD; No carotid bruits ?LYMPHATICS: No lymphadenopathy ?CARDIAC: RRR, no  murmurs, rubs, gallops ?RESPIRATORY:  Clear to auscultation without rales, wheezing or rhonchi  ?ABDOMEN: Soft, non-tender, non-distended ?MUSCULOSKELETAL:  No edema; No deformity  ?SKIN: Warm and dry ?NEUROLOGIC:  Alert and oriented x 3 ?PSYCHIATRIC:  Normal affect  ? ?ASSESSMENT:   ? ?Cardiac risk: Intermediate risk of ASCVD in 10 years. She is on statin therapy. Her A1c is well controlled. Goal A1c < 7.   Recommend to continue with lifestyle modification and CVD risk mitigation (yearly A1c, lipid monitoring); otherwise she does not have an indication for an ischemic evaluation at this time. We did discuss signs of coronary disease. If she develops, can be seen for stress testing ? ?HLD: LDL 68 mg/dL at goal. Continue simvastatin 20 mg daily ? ?CardioOnc: Currently not on AI. S/p XRT to the left breast.  Low risk of significant htn with AI if she were to start. XRT typically incorporates cardioprotective measures including breath holds and localizing the radiation to reduce risk of valvular fibrosis and accelerated atherosclerosis. ? ?PLAN:   ? ?In order of problems listed above: ? ?Follow up as needed ? ?   ? ?   ? ? ?Medication Adjustments/Labs and Tests Ordered: ?Current medicines are reviewed at length with the patient today.  Concerns regarding medicines are outlined above.  ?No orders of the defined types were placed in this encounter. ? ?No orders of the defined types were placed in this encounter. ? ? ?There are no Patient Instructions on file for this visit.  ? ?Signed, ?Janina Mayo, MD  ?06/22/2021 8:43 AM    ?[place] ?
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
## 2021-06-24 ENCOUNTER — Other Ambulatory Visit: Payer: Self-pay | Admitting: Family Medicine

## 2021-06-25 ENCOUNTER — Other Ambulatory Visit (HOSPITAL_BASED_OUTPATIENT_CLINIC_OR_DEPARTMENT_OTHER): Payer: Self-pay

## 2021-06-25 MED ORDER — METFORMIN HCL ER 500 MG PO TB24
500.0000 mg | ORAL_TABLET | Freq: Two times a day (BID) | ORAL | 1 refills | Status: DC
Start: 2021-06-25 — End: 2021-12-29
  Filled 2021-06-25: qty 180, 90d supply, fill #0
  Filled 2021-09-14: qty 180, 90d supply, fill #1

## 2021-06-30 ENCOUNTER — Other Ambulatory Visit (HOSPITAL_BASED_OUTPATIENT_CLINIC_OR_DEPARTMENT_OTHER): Payer: Self-pay

## 2021-06-30 DIAGNOSIS — H2512 Age-related nuclear cataract, left eye: Secondary | ICD-10-CM | POA: Diagnosis not present

## 2021-06-30 DIAGNOSIS — H25043 Posterior subcapsular polar age-related cataract, bilateral: Secondary | ICD-10-CM | POA: Diagnosis not present

## 2021-06-30 DIAGNOSIS — H02831 Dermatochalasis of right upper eyelid: Secondary | ICD-10-CM | POA: Diagnosis not present

## 2021-06-30 DIAGNOSIS — H25013 Cortical age-related cataract, bilateral: Secondary | ICD-10-CM | POA: Diagnosis not present

## 2021-06-30 DIAGNOSIS — H2513 Age-related nuclear cataract, bilateral: Secondary | ICD-10-CM | POA: Diagnosis not present

## 2021-06-30 MED ORDER — MOXIFLOXACIN HCL 0.5 % OP SOLN
OPHTHALMIC | 1 refills | Status: DC
Start: 2021-06-30 — End: 2021-07-31
  Filled 2021-06-30: qty 6, 30d supply, fill #0
  Filled 2021-07-29: qty 6, 30d supply, fill #1

## 2021-06-30 MED ORDER — PREDNISOLONE ACETATE 1 % OP SUSP
OPHTHALMIC | 1 refills | Status: DC
Start: 2021-06-30 — End: 2021-07-31
  Filled 2021-06-30: qty 5, 25d supply, fill #0
  Filled 2021-07-29: qty 5, 25d supply, fill #1

## 2021-06-30 MED ORDER — KETOROLAC TROMETHAMINE 0.5 % OP SOLN
OPHTHALMIC | 1 refills | Status: DC
  Filled 2021-06-30: qty 5, 50d supply, fill #0

## 2021-07-06 ENCOUNTER — Ambulatory Visit: Payer: PPO

## 2021-07-21 ENCOUNTER — Encounter: Payer: Self-pay | Admitting: Hematology & Oncology

## 2021-07-21 ENCOUNTER — Inpatient Hospital Stay: Payer: PPO | Admitting: Hematology & Oncology

## 2021-07-21 ENCOUNTER — Inpatient Hospital Stay: Payer: PPO | Attending: Hematology & Oncology

## 2021-07-21 VITALS — BP 156/63 | HR 83 | Temp 97.7°F | Resp 20 | Ht 63.0 in | Wt 152.1 lb

## 2021-07-21 DIAGNOSIS — Z79899 Other long term (current) drug therapy: Secondary | ICD-10-CM | POA: Insufficient documentation

## 2021-07-21 DIAGNOSIS — C50412 Malignant neoplasm of upper-outer quadrant of left female breast: Secondary | ICD-10-CM

## 2021-07-21 DIAGNOSIS — C50912 Malignant neoplasm of unspecified site of left female breast: Secondary | ICD-10-CM | POA: Insufficient documentation

## 2021-07-21 DIAGNOSIS — Z923 Personal history of irradiation: Secondary | ICD-10-CM | POA: Diagnosis not present

## 2021-07-21 DIAGNOSIS — M8580 Other specified disorders of bone density and structure, unspecified site: Secondary | ICD-10-CM

## 2021-07-21 DIAGNOSIS — Z170 Estrogen receptor positive status [ER+]: Secondary | ICD-10-CM

## 2021-07-21 DIAGNOSIS — Z79811 Long term (current) use of aromatase inhibitors: Secondary | ICD-10-CM | POA: Insufficient documentation

## 2021-07-21 LAB — CMP (CANCER CENTER ONLY)
ALT: 17 U/L (ref 0–44)
AST: 17 U/L (ref 15–41)
Albumin: 4.8 g/dL (ref 3.5–5.0)
Alkaline Phosphatase: 47 U/L (ref 38–126)
Anion gap: 7 (ref 5–15)
BUN: 21 mg/dL (ref 8–23)
CO2: 31 mmol/L (ref 22–32)
Calcium: 10.1 mg/dL (ref 8.9–10.3)
Chloride: 96 mmol/L — ABNORMAL LOW (ref 98–111)
Creatinine: 0.81 mg/dL (ref 0.44–1.00)
GFR, Estimated: >60 mL/min (ref >60)
Glucose, Bld: 95 mg/dL (ref 70–99)
Potassium: 4.6 mmol/L (ref 3.5–5.1)
Sodium: 134 mmol/L — ABNORMAL LOW (ref 135–145)
Total Bilirubin: 0.7 mg/dL (ref 0.3–1.2)
Total Protein: 7.5 g/dL (ref 6.5–8.1)

## 2021-07-21 LAB — CBC WITH DIFFERENTIAL (CANCER CENTER ONLY)
Abs Immature Granulocytes: 0.03 10*3/uL (ref 0.00–0.07)
Basophils Absolute: 0.1 10*3/uL (ref 0.0–0.1)
Basophils Relative: 1 %
Eosinophils Absolute: 0.4 10*3/uL (ref 0.0–0.5)
Eosinophils Relative: 5 %
HCT: 40.9 % (ref 36.0–46.0)
Hemoglobin: 13.6 g/dL (ref 12.0–15.0)
Immature Granulocytes: 0 %
Lymphocytes Relative: 38 %
Lymphs Abs: 2.9 10*3/uL (ref 0.7–4.0)
MCH: 30.5 pg (ref 26.0–34.0)
MCHC: 33.3 g/dL (ref 30.0–36.0)
MCV: 91.7 fL (ref 80.0–100.0)
Monocytes Absolute: 0.9 10*3/uL (ref 0.1–1.0)
Monocytes Relative: 11 %
Neutro Abs: 3.4 10*3/uL (ref 1.7–7.7)
Neutrophils Relative %: 45 %
Platelet Count: 327 10*3/uL (ref 150–400)
RBC: 4.46 MIL/uL (ref 3.87–5.11)
RDW: 11.9 % (ref 11.5–15.5)
WBC Count: 7.7 10*3/uL (ref 4.0–10.5)
nRBC: 0 % (ref 0.0–0.2)

## 2021-07-21 NOTE — Progress Notes (Signed)
Hematology and Oncology Follow Up Visit  Michelle [name] [id number] [birth year] 66 y.o. 07/21/2021   Principle Diagnosis:  Stage 1A ([id number]) infiltrating ductal carcinoma of the LEFT breast- -ER positive/HER2 negative --Oncotype score of 17.  Current Therapy:   Status post left lumpectomy on 06/11/2020 Status post radiation therapy-completed on 08/19/2020 Femara 2.5 mg p.o. daily -she has not yet started     Interim History:  Michelle [name] is back for follow-up.  We last saw her back in February.  Since then, she been doing okay.  She can have cataract surgery tomorrow.  This would be of the left eye.  The right I will be taking care of after she gets back from [place].  She is going up to [place] in July to help take care of her mom.  She is doing okay otherwise.  She has had no problems with respect to the breast cancer.  She is taking natural aromatase inhibitors.  She takes a organic drink that she likes to use.  She has had no change in bowel or bladder habits.  She has had no cough or shortness of breath.  She has had no nausea or vomiting.  There is been no bony pain.  She has had no leg swelling.  She has had no rashes.  Overall, say performance status for now is ECOG 0.     Medications:  Current Outpatient Medications:    Alpha-Lipoic Acid 600 MG TABS, Take 600 mg by mouth daily., Disp: , Rfl:    amLODipine (NORVASC) 5 MG tablet, TAKE 1 TABLET (5 MG TOTAL) BY MOUTH DAILY., Disp: 90 tablet, Rfl: 1   Ascorbic Acid (VITAMIN C) 1000 MG tablet, Take 1,000 mg by mouth 2 (two) times daily., Disp: , Rfl:    ASPIRIN 81 PO, Take by mouth daily., Disp: , Rfl:    blood glucose meter kit and supplies KIT, As directed, Disp: 1 each, Rfl: 0   Blood Glucose Monitoring Suppl (FREESTYLE LITE) w/Device KIT, Use as directed, Disp: 1 kit, Rfl: 0   budesonide-formoterol (SYMBICORT) 80-4.5 MCG/ACT inhaler, INHALE 2 PUFFS BY MOUTH INTO THE LUNGS IN THE MORNING AND AT BEDTIME., Disp: 10.2 g, Rfl:  5   Calcium-Magnesium (CAL-MAG PO), Take 1 tablet by mouth 3 (three) times daily., Disp: , Rfl:    Cholecalciferol (DIALYVITE VITAMIN D 5000) 125 MCG (5000 UT) capsule, Take 5,000 Units by mouth daily., Disp: , Rfl:    CHROMIUM PO, Take 2 tablets by mouth 2 (two) times daily., Disp: , Rfl:    CINNAMON PO, Take 125 mg by mouth 3 (three) times daily., Disp: , Rfl:    co-enzyme Q-10 50 MG capsule, Take 100 mg by mouth daily., Disp: , Rfl:    Digestive Enzyme CAPS, Take 1 capsule by mouth 3 (three) times daily with meals., Disp: , Rfl:    fluticasone (FLONASE) 50 MCG/ACT nasal spray, Place 1 spray into both nostrils 2 (two) times daily., Disp: , Rfl:    GARLIC PO, Take 1 tablet by mouth 3 (three) times daily., Disp: , Rfl:    glucose blood (FREESTYLE LITE) test strip, Test blood sugars 3 times daily, Disp: 100 each, Rfl: 2   hydrochlorothiazide (HYDRODIURIL) 25 MG tablet, TAKE 1 TABLET (25 MG TOTAL) BY MOUTH DAILY., Disp: 90 tablet, Rfl: 1   Lancets (FREESTYLE) lancets, Use as directed, Disp: 100 each, Rfl: 0   loratadine (CLARITIN) 10 MG tablet, Take 10 mg by mouth at bedtime., Disp: , Rfl:  Melatonin 10 MG CAPS, Take 20 mg by mouth at bedtime., Disp: , Rfl:    metFORMIN (GLUCOPHAGE-XR) 500 MG 24 hr tablet, Take 1 tablet (500 mg total) by mouth 2 (two) times daily., Disp: 180 tablet, Rfl: 1   Methylcobalamin 50000 MCG SOLR, Inject 5,000 mcg as directed 2 (two) times a week. Tuesdays and Saturdays, Disp: , Rfl:    MULTIPLE VITAMIN PO, Take 2 capsules by mouth in the morning, at noon, and at bedtime., Disp: , Rfl:    NALTREXONE HCL PO, Take 3 mg by mouth at bedtime. Natrexone er 1.5 mg capsules x 2, Disp: , Rfl:    Omega-3 Fatty Acids (THE VERY FINEST FISH OIL) LIQD, Take 15 mLs by mouth daily., Disp: , Rfl:    POTASSIUM IODIDE PO, Take 225 mcg by mouth 3 (three) times daily., Disp: , Rfl:    Probiotic Product (PROBIOTIC PO), Take 1 capsule by mouth at bedtime., Disp: , Rfl:    QUERCETIN PO, Take 1  tablet by mouth 3 (three) times daily., Disp: , Rfl:    simvastatin (ZOCOR) 20 MG tablet, TAKE 1 TABLET (20 MG TOTAL) BY MOUTH DAILY AT 6 PM., Disp: 90 tablet, Rfl: 1   thyroid (NP THYROID) 90 MG tablet, Take 1/2 tablet by mouth daily, Disp: 100 tablet, Rfl: 3   TURMERIC PO, Take 1,500 mg by mouth 2 (two) times daily., Disp: , Rfl:    VITAMIN A PO, Take 20,000 Units by mouth daily., Disp: , Rfl:    VITAMIN E PO, Take 260 mg by mouth daily., Disp: , Rfl:    Zinc 30 MG CAPS, Take 30 mg by mouth 2 (two) times daily., Disp: , Rfl:    ALBUTEROL IN, 07/21/2021 1-2 puffs every 4-6 hours prn., Disp: , Rfl:    ketorolac (ACULAR) 0.5 % ophthalmic solution, Place 1 drop into the left eye 2 times daily as directed (Patient not taking: Reported on 07/21/2021), Disp: 5 mL, Rfl: 1   Menaquinone-7 (VITAMIN K2 PO), Take 1 tablet by mouth daily. (Patient not taking: Reported on 07/21/2021), Disp: , Rfl:    moxifloxacin (VIGAMOX) 0.5 % ophthalmic solution, Place 1 drop into the left eye 4 times daily as directed (Patient not taking: Reported on 07/21/2021), Disp: 6 mL, Rfl: 1   prednisoLONE acetate (PRED FORTE) 1 % ophthalmic suspension, Place 1 drop into the left eye 4 times daily as directed. **Shake well before each use** (Patient not taking: Reported on 07/21/2021), Disp: 5 mL, Rfl: 1  Allergies:  Allergies  Allergen Reactions   Influenza Vaccine Live Other (See Comments)    Sever flu symptoms, chest tightness, wheezing   Shrimp [Shellfish Allergy] Itching    Allergic to shrimp.  Wheezing and tightness in chest.   Tdap [Tetanus-Diphth-Acell Pertussis] Other (See Comments)    Change in level of consciousness, altered mental state   Demerol [Meperidine] Nausea And Vomiting   Peanut-Containing Drug Products Other (See Comments)    wheezing   Chlorhexidine Gluconate [Chlorhexidine] Rash    Patient states she is sensitive to CHG solution and prefers not to use it   Latex Rash    Past Medical History, Surgical  history, Social history, and Family History were reviewed and updated.  Review of Systems: Review of Systems  Constitutional: Negative.   HENT:  Negative.    Eyes: Negative.   Respiratory: Negative.    Cardiovascular: Negative.   Gastrointestinal: Negative.   Endocrine: Negative.   Genitourinary: Negative.    Musculoskeletal: Negative.  Skin: Negative.   Neurological: Negative.   Hematological: Negative.   Psychiatric/Behavioral: Negative.     Physical Exam:  height is _0  (1.6 m) and weight is 152 lb 1.9 oz (69 kg). Her oral temperature is 97.7 F (36.5 C). Her blood pressure is 156/63 (abnormal) and her pulse is 83. Her respiration is 20 and oxygen saturation is 100%.   Wt Readings from Last 3 Encounters:  07/21/21 152 lb 1.9 oz (69 kg)  06/22/21 156 lb 8 oz (71 kg)  06/22/21 154 lb 9.6 oz (70.1 kg)    Physical Exam Vitals reviewed.  Constitutional:      Comments: Her breast exam shows right breast with no masses, edema or erythema.  There is no right axillary adenopathy.  Left breast shows the lumpectomy that is well-healed.  There is at the edge of the areola at about the 1 o'clock position.  This is well-healed.  She has no breast masses.  There are some hyperpigmentation on the left breast from radiation.  The hyperpigmentation is mostly noted in the lower aspect of the left breast.  There is no left axillary adenopathy.  HENT:     Head: Normocephalic and atraumatic.  Eyes:     Pupils: Pupils are equal, round, and reactive to light.  Cardiovascular:     Rate and Rhythm: Normal rate and regular rhythm.     Heart sounds: Normal heart sounds.  Pulmonary:     Effort: Pulmonary effort is normal.     Breath sounds: Normal breath sounds.  Abdominal:     General: Bowel sounds are normal.     Palpations: Abdomen is soft.  Musculoskeletal:        General: No tenderness or deformity. Normal range of motion.     Cervical back: Normal range of motion.  Lymphadenopathy:      Cervical: No cervical adenopathy.  Skin:    General: Skin is warm and dry.     Findings: No erythema or rash.  Neurological:     Mental Status: She is alert and oriented to person, place, and time.  Psychiatric:        Behavior: Behavior normal.        Thought Content: Thought content normal.        Judgment: Judgment normal.     Lab Results  Component Value Date   WBC 7.7 07/21/2021   HGB 13.6 07/21/2021   HCT 40.9 07/21/2021   MCV 91.7 07/21/2021   PLT 327 07/21/2021     Chemistry      Component Value Date/Time   NA 133 (L) 06/16/2021 1003   K 4.0 06/16/2021 1003   CL 95 (L) 06/16/2021 1003   CO2 30 06/16/2021 1003   BUN 15 06/16/2021 1003   CREATININE 0.77 06/16/2021 1003   CREATININE 0.71 03/24/2021 0903   CREATININE 0.74 11/06/2019 1113      Component Value Date/Time   CALCIUM 9.8 06/16/2021 1003   ALKPHOS 45 06/16/2021 1003   AST 16 06/16/2021 1003   AST 22 03/24/2021 0903   ALT 16 06/16/2021 1003   ALT 18 03/24/2021 0903   BILITOT 0.9 06/16/2021 1003   BILITOT 0.6 03/24/2021 8144      Impression and Plan: Ms. Blyth is a very nice 66 year old postmenopausal white female.  She has a early stage-stage IA-ductal carcinoma of the left breast.  She underwent lumpectomy .  She had a low Oncotype score.  She has completed radiation therapy.  She completed this  in February.  Again, she is not on Femara.  She is taking her aromatase inhibitor natural products.  At this point, we will plan to get her back to see [place] in about 4 months.  She did have a bone density test done back in February.  This did not show any evidence of osteopenia.    Volanda Napoleon, MD 6/6/202310:27 AM

## 2021-07-22 LAB — CANCER ANTIGEN 27.29: CA 27.29: <9 U/mL (ref 0.0–38.6)

## 2021-07-30 ENCOUNTER — Other Ambulatory Visit: Payer: Self-pay | Admitting: Family Medicine

## 2021-07-30 ENCOUNTER — Other Ambulatory Visit (HOSPITAL_BASED_OUTPATIENT_CLINIC_OR_DEPARTMENT_OTHER): Payer: Self-pay

## 2021-07-30 DIAGNOSIS — E1165 Type 2 diabetes mellitus with hyperglycemia: Secondary | ICD-10-CM

## 2021-07-30 DIAGNOSIS — H2512 Age-related nuclear cataract, left eye: Secondary | ICD-10-CM | POA: Diagnosis not present

## 2021-07-30 MED ORDER — BLOOD GLUCOSE MONITOR KIT
PACK | 0 refills | Status: AC
  Filled 2021-07-30: qty 1, 30d supply, fill #0

## 2021-07-30 MED ORDER — ONETOUCH ULTRASOFT LANCETS MISC
0 refills | Status: AC
  Filled 2021-07-30: qty 100, 90d supply, fill #0

## 2021-07-30 MED ORDER — GLUCOSE BLOOD VI STRP
ORAL_STRIP | 0 refills | Status: AC
  Filled 2021-07-30: qty 50, 50d supply, fill #0

## 2021-07-31 ENCOUNTER — Other Ambulatory Visit (HOSPITAL_BASED_OUTPATIENT_CLINIC_OR_DEPARTMENT_OTHER): Payer: Self-pay

## 2021-07-31 DIAGNOSIS — H2511 Age-related nuclear cataract, right eye: Secondary | ICD-10-CM | POA: Diagnosis not present

## 2021-07-31 DIAGNOSIS — H2512 Age-related nuclear cataract, left eye: Secondary | ICD-10-CM | POA: Diagnosis not present

## 2021-07-31 HISTORY — PX: CATARACT EXTRACTION: SUR2

## 2021-07-31 MED ORDER — PREDNISOLONE ACETATE 1 % OP SUSP
OPHTHALMIC | 1 refills | Status: DC
  Filled 2021-07-31: qty 5, 25d supply, fill #0
  Filled 2021-09-14: qty 5, 18d supply, fill #0

## 2021-07-31 MED ORDER — KETOROLAC TROMETHAMINE 0.5 % OP SOLN
1.0000 [drp] | Freq: Four times a day (QID) | OPHTHALMIC | 1 refills | Status: DC
  Filled 2021-07-31 – 2021-08-06 (×4): qty 5, 25d supply, fill #0
  Filled 2021-09-14: qty 5, 25d supply, fill #1

## 2021-07-31 MED ORDER — MOXIFLOXACIN HCL 0.5 % OP SOLN
OPHTHALMIC | 1 refills | Status: DC
  Filled 2021-07-31: qty 3, 30d supply, fill #0

## 2021-08-03 ENCOUNTER — Other Ambulatory Visit (HOSPITAL_BASED_OUTPATIENT_CLINIC_OR_DEPARTMENT_OTHER): Payer: Self-pay

## 2021-08-04 ENCOUNTER — Other Ambulatory Visit (HOSPITAL_BASED_OUTPATIENT_CLINIC_OR_DEPARTMENT_OTHER): Payer: Self-pay

## 2021-08-06 ENCOUNTER — Other Ambulatory Visit (HOSPITAL_BASED_OUTPATIENT_CLINIC_OR_DEPARTMENT_OTHER): Payer: Self-pay

## 2021-08-07 DIAGNOSIS — H2512 Age-related nuclear cataract, left eye: Secondary | ICD-10-CM | POA: Diagnosis not present

## 2021-08-13 DIAGNOSIS — Z6828 Body mass index (BMI) 28.0-28.9, adult: Secondary | ICD-10-CM | POA: Diagnosis not present

## 2021-08-13 DIAGNOSIS — Z01419 Encounter for gynecological examination (general) (routine) without abnormal findings: Secondary | ICD-10-CM | POA: Diagnosis not present

## 2021-08-19 ENCOUNTER — Other Ambulatory Visit (HOSPITAL_BASED_OUTPATIENT_CLINIC_OR_DEPARTMENT_OTHER): Payer: Self-pay

## 2021-08-25 ENCOUNTER — Encounter (HOSPITAL_COMMUNITY): Payer: Self-pay

## 2021-09-08 ENCOUNTER — Other Ambulatory Visit (HOSPITAL_BASED_OUTPATIENT_CLINIC_OR_DEPARTMENT_OTHER): Payer: Self-pay

## 2021-09-09 ENCOUNTER — Other Ambulatory Visit (HOSPITAL_BASED_OUTPATIENT_CLINIC_OR_DEPARTMENT_OTHER): Payer: Self-pay

## 2021-09-14 ENCOUNTER — Other Ambulatory Visit (HOSPITAL_BASED_OUTPATIENT_CLINIC_OR_DEPARTMENT_OTHER): Payer: Self-pay

## 2021-09-18 ENCOUNTER — Other Ambulatory Visit (HOSPITAL_BASED_OUTPATIENT_CLINIC_OR_DEPARTMENT_OTHER): Payer: Self-pay

## 2021-09-21 ENCOUNTER — Other Ambulatory Visit (HOSPITAL_BASED_OUTPATIENT_CLINIC_OR_DEPARTMENT_OTHER): Payer: Self-pay

## 2021-09-29 ENCOUNTER — Other Ambulatory Visit (HOSPITAL_BASED_OUTPATIENT_CLINIC_OR_DEPARTMENT_OTHER): Payer: Self-pay

## 2021-10-01 ENCOUNTER — Other Ambulatory Visit (HOSPITAL_BASED_OUTPATIENT_CLINIC_OR_DEPARTMENT_OTHER): Payer: Self-pay

## 2021-10-02 DIAGNOSIS — H2511 Age-related nuclear cataract, right eye: Secondary | ICD-10-CM | POA: Diagnosis not present

## 2021-10-02 HISTORY — PX: CATARACT EXTRACTION: SUR2

## 2021-10-04 ENCOUNTER — Encounter: Payer: Self-pay | Admitting: Emergency Medicine

## 2021-10-04 ENCOUNTER — Ambulatory Visit
Admission: EM | Admit: 2021-10-04 | Discharge: 2021-10-04 | Disposition: A | Discharge status: Home / Self Care | Payer: PPO | Attending: Family Medicine | Admitting: Family Medicine

## 2021-10-04 DIAGNOSIS — J069 Acute upper respiratory infection, unspecified: Secondary | ICD-10-CM | POA: Diagnosis not present

## 2021-10-04 DIAGNOSIS — H66002 Acute suppurative otitis media without spontaneous rupture of ear drum, left ear: Secondary | ICD-10-CM | POA: Diagnosis not present

## 2021-10-04 LAB — POC SARS CORONAVIRUS 2 AG -  ED: SARS Coronavirus 2 Ag: NEGATIVE

## 2021-10-04 MED ORDER — CEFDINIR 300 MG PO CAPS: 300.0000 mg | ORAL_CAPSULE | Freq: Two times a day (BID) | ORAL | 0 refills | Status: DC

## 2021-10-04 MED ORDER — PREDNISONE 20 MG PO TABS: 20.0000 mg | ORAL_TABLET | Freq: Two times a day (BID) | ORAL | 0 refills | Status: DC

## 2021-10-04 NOTE — ED Triage Notes (Signed)
Flew home from [place] on Wednesday  Grandkids had whooping cough  Pt is vaccinated against pertussis  COVID vaccine x 2 - no boosters COVID x 1 in 11/22 Left ear pain x 24 hours  Aleve 1 tab  at 0900, along w/ 1 gm of tylenol  Tramadol during the night  Started a z-pak yesterday  (may be expired) Pt is a retired Marine scientist  Productive cough  No home COVID tests Cataract surgery on Right eye  Ant bites on right arm also this past week
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
## 2021-10-04 NOTE — Discharge Instructions (Signed)
Make sure you are drinking plenty of fluids Run a humidifier in your bedroom if you have 1 Take Omnicef 2 times a day Take prednisone as directed See your physician if not improving by next week  COVID test is negative

## 2021-10-06 ENCOUNTER — Other Ambulatory Visit (HOSPITAL_BASED_OUTPATIENT_CLINIC_OR_DEPARTMENT_OTHER): Payer: Self-pay

## 2021-10-09 DIAGNOSIS — Z9849 Cataract extraction status, unspecified eye: Secondary | ICD-10-CM | POA: Diagnosis not present

## 2021-10-09 DIAGNOSIS — H52223 Regular astigmatism, bilateral: Secondary | ICD-10-CM | POA: Diagnosis not present

## 2021-10-09 DIAGNOSIS — Z961 Presence of intraocular lens: Secondary | ICD-10-CM | POA: Diagnosis not present

## 2021-10-09 DIAGNOSIS — H524 Presbyopia: Secondary | ICD-10-CM | POA: Diagnosis not present

## 2021-10-09 DIAGNOSIS — H5203 Hypermetropia, bilateral: Secondary | ICD-10-CM | POA: Diagnosis not present

## 2021-10-09 DIAGNOSIS — H2511 Age-related nuclear cataract, right eye: Secondary | ICD-10-CM | POA: Diagnosis not present

## 2021-10-12 ENCOUNTER — Ambulatory Visit (INDEPENDENT_AMBULATORY_CARE_PROVIDER_SITE_OTHER): Payer: PPO | Admitting: Family Medicine

## 2021-10-12 ENCOUNTER — Encounter: Payer: Self-pay | Admitting: Family Medicine

## 2021-10-12 ENCOUNTER — Other Ambulatory Visit (HOSPITAL_BASED_OUTPATIENT_CLINIC_OR_DEPARTMENT_OTHER): Payer: Self-pay

## 2021-10-12 VITALS — BP 132/68 | HR 103 | Temp 98.4°F | Resp 18 | Ht 63.0 in | Wt 156.6 lb

## 2021-10-12 DIAGNOSIS — J40 Bronchitis, not specified as acute or chronic: Secondary | ICD-10-CM

## 2021-10-12 DIAGNOSIS — R051 Acute cough: Secondary | ICD-10-CM

## 2021-10-12 MED ORDER — METHYLPREDNISOLONE ACETATE 80 MG/ML IJ SUSP
80.0000 mg | Freq: Once | INTRAMUSCULAR | Status: AC
  Administered 2021-10-12: 80 mg via INTRAMUSCULAR

## 2021-10-12 MED ORDER — LEVOFLOXACIN 500 MG PO TABS
500.0000 mg | ORAL_TABLET | Freq: Every day | ORAL | 0 refills | Status: AC
  Filled 2021-10-12: qty 7, 7d supply, fill #0

## 2021-10-12 MED ORDER — PREDNISONE 10 MG PO TABS
ORAL_TABLET | ORAL | 0 refills | Status: DC
  Filled 2021-10-12: qty 20, 12d supply, fill #0

## 2021-10-12 NOTE — Patient Instructions (Signed)
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
## 2021-10-13 ENCOUNTER — Encounter: Payer: Self-pay | Admitting: Family Medicine

## 2021-10-13 LAB — CBC WITH DIFFERENTIAL/PLATELET
Basophils Absolute: 0.1 10*3/uL (ref 0.0–0.1)
Basophils Relative: 1.2 % (ref 0.0–3.0)
Eosinophils Absolute: 0.4 10*3/uL (ref 0.0–0.7)
Eosinophils Relative: 2.9 % (ref 0.0–5.0)
HCT: 39.3 % (ref 36.0–46.0)
Hemoglobin: 13 g/dL (ref 12.0–15.0)
Lymphocytes Relative: 24.9 % (ref 12.0–46.0)
Lymphs Abs: 3.1 10*3/uL (ref 0.7–4.0)
MCHC: 33.1 g/dL (ref 30.0–36.0)
MCV: 85.6 fl (ref 78.0–100.0)
Monocytes Absolute: 0.6 10*3/uL (ref 0.1–1.0)
Monocytes Relative: 4.9 % (ref 3.0–12.0)
Neutro Abs: 8.2 10*3/uL — ABNORMAL HIGH (ref 1.4–7.7)
Neutrophils Relative %: 66.1 % (ref 43.0–77.0)
Platelets: 466 10*3/uL — ABNORMAL HIGH (ref 150.0–400.0)
RBC: 4.59 Mil/uL (ref 3.87–5.11)
RDW: 13.4 % (ref 11.5–15.5)
WBC: 12.4 10*3/uL — ABNORMAL HIGH (ref 4.0–10.5)

## 2021-10-13 LAB — EPSTEIN-BARR VIRUS VCA ANTIBODY PANEL
EBV NA IgG: >600 U/mL — ABNORMAL HIGH
EBV VCA IgG: 441 U/mL — ABNORMAL HIGH
EBV VCA IgM: <36 U/mL

## 2021-10-26 ENCOUNTER — Ambulatory Visit: Payer: PPO | Attending: General Surgery

## 2021-10-26 VITALS — Wt 161.4 lb

## 2021-10-26 DIAGNOSIS — Z483 Aftercare following surgery for neoplasm: Secondary | ICD-10-CM | POA: Insufficient documentation
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
## 2021-11-03 ENCOUNTER — Other Ambulatory Visit (HOSPITAL_BASED_OUTPATIENT_CLINIC_OR_DEPARTMENT_OTHER): Payer: Self-pay

## 2021-11-06 ENCOUNTER — Telehealth: Payer: Self-pay | Admitting: Family Medicine

## 2021-11-06 NOTE — Telephone Encounter (Signed)
Left message for patient to call back and schedule Medicare Annual Wellness Visit (AWV).   Please offer to do virtually or by telephone.  Left office number and my jabber [phone].  Last AWV:11/04/2020  Please schedule at anytime with Nurse Health Advisor.

## 2021-11-30 ENCOUNTER — Other Ambulatory Visit: Payer: Self-pay | Admitting: Family Medicine

## 2021-11-30 DIAGNOSIS — I10 Essential (primary) hypertension: Secondary | ICD-10-CM

## 2021-11-30 DIAGNOSIS — Z923 Personal history of irradiation: Secondary | ICD-10-CM

## 2021-11-30 DIAGNOSIS — E785 Hyperlipidemia, unspecified: Secondary | ICD-10-CM

## 2021-11-30 DIAGNOSIS — E1169 Type 2 diabetes mellitus with other specified complication: Secondary | ICD-10-CM

## 2021-12-01 ENCOUNTER — Other Ambulatory Visit: Payer: Self-pay

## 2021-12-01 ENCOUNTER — Other Ambulatory Visit (HOSPITAL_BASED_OUTPATIENT_CLINIC_OR_DEPARTMENT_OTHER): Payer: Self-pay

## 2021-12-01 ENCOUNTER — Ambulatory Visit: Payer: PPO | Attending: General Surgery | Admitting: Physical Therapy

## 2021-12-01 DIAGNOSIS — R279 Unspecified lack of coordination: Secondary | ICD-10-CM | POA: Diagnosis not present

## 2021-12-01 DIAGNOSIS — R293 Abnormal posture: Secondary | ICD-10-CM | POA: Diagnosis not present

## 2021-12-01 DIAGNOSIS — M6281 Muscle weakness (generalized): Secondary | ICD-10-CM | POA: Insufficient documentation

## 2021-12-01 MED ORDER — BUDESONIDE-FORMOTEROL FUMARATE 80-4.5 MCG/ACT IN AERO
2.0000 | INHALATION_SPRAY | Freq: Two times a day (BID) | RESPIRATORY_TRACT | 5 refills | Status: DC
  Filled 2021-12-01: qty 10.2, 30d supply, fill #0
  Filled 2022-01-18: qty 10.2, 30d supply, fill #1
  Filled 2022-02-15: qty 10.2, 30d supply, fill #2
  Filled 2022-04-12: qty 10.2, 30d supply, fill #3

## 2021-12-01 MED ORDER — AMLODIPINE BESYLATE 5 MG PO TABS
5.0000 mg | ORAL_TABLET | Freq: Every day | ORAL | 1 refills | Status: DC
  Filled 2021-12-01 – 2021-12-03 (×2): qty 90, 90d supply, fill #0
  Filled 2022-02-16: qty 90, 90d supply, fill #1

## 2021-12-01 MED ORDER — HYDROCHLOROTHIAZIDE 25 MG PO TABS
25.0000 mg | ORAL_TABLET | Freq: Every day | ORAL | 1 refills | Status: DC
  Filled 2021-12-01 – 2021-12-03 (×2): qty 90, 90d supply, fill #0
  Filled 2022-02-16: qty 90, 90d supply, fill #1

## 2021-12-01 MED ORDER — SIMVASTATIN 20 MG PO TABS
20.0000 mg | ORAL_TABLET | Freq: Every day | ORAL | 1 refills | Status: DC
  Filled 2021-12-01 – 2021-12-03 (×2): qty 90, 90d supply, fill #0
  Filled 2022-02-16: qty 90, 90d supply, fill #1
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
## 2021-12-03 ENCOUNTER — Other Ambulatory Visit (HOSPITAL_BASED_OUTPATIENT_CLINIC_OR_DEPARTMENT_OTHER): Payer: Self-pay

## 2021-12-08 ENCOUNTER — Ambulatory Visit: Payer: PPO | Admitting: Hematology & Oncology

## 2021-12-08 ENCOUNTER — Inpatient Hospital Stay: Payer: PPO

## 2021-12-14 ENCOUNTER — Inpatient Hospital Stay: Payer: PPO | Attending: Hematology & Oncology | Admitting: Hematology & Oncology

## 2021-12-14 ENCOUNTER — Inpatient Hospital Stay: Payer: PPO

## 2021-12-14 ENCOUNTER — Encounter: Payer: Self-pay | Admitting: Hematology & Oncology

## 2021-12-14 VITALS — BP 159/64 | HR 87 | Temp 98.1°F | Resp 20 | Ht 63.0 in | Wt 165.0 lb

## 2021-12-14 DIAGNOSIS — Z923 Personal history of irradiation: Secondary | ICD-10-CM | POA: Diagnosis not present

## 2021-12-14 DIAGNOSIS — Z170 Estrogen receptor positive status [ER+]: Secondary | ICD-10-CM | POA: Diagnosis not present

## 2021-12-14 DIAGNOSIS — Z79899 Other long term (current) drug therapy: Secondary | ICD-10-CM | POA: Diagnosis not present

## 2021-12-14 DIAGNOSIS — C50412 Malignant neoplasm of upper-outer quadrant of left female breast: Secondary | ICD-10-CM | POA: Diagnosis not present

## 2021-12-14 DIAGNOSIS — C50912 Malignant neoplasm of unspecified site of left female breast: Secondary | ICD-10-CM | POA: Diagnosis not present

## 2021-12-14 LAB — CBC WITH DIFFERENTIAL (CANCER CENTER ONLY)
Abs Immature Granulocytes: 0.01 10*3/uL (ref 0.00–0.07)
Basophils Absolute: 0.1 10*3/uL (ref 0.0–0.1)
Basophils Relative: 1 %
Eosinophils Absolute: 0.6 10*3/uL — ABNORMAL HIGH (ref 0.0–0.5)
Eosinophils Relative: 7 %
HCT: 42.1 % (ref 36.0–46.0)
Hemoglobin: 13.8 g/dL (ref 12.0–15.0)
Immature Granulocytes: 0 %
Lymphocytes Relative: 43 %
Lymphs Abs: 3.4 10*3/uL (ref 0.7–4.0)
MCH: 28.9 pg (ref 26.0–34.0)
MCHC: 32.8 g/dL (ref 30.0–36.0)
MCV: 88.1 fL (ref 80.0–100.0)
Monocytes Absolute: 0.8 10*3/uL (ref 0.1–1.0)
Monocytes Relative: 10 %
Neutro Abs: 3.1 10*3/uL (ref 1.7–7.7)
Neutrophils Relative %: 39 %
Platelet Count: 304 10*3/uL (ref 150–400)
RBC: 4.78 MIL/uL (ref 3.87–5.11)
RDW: 13.3 % (ref 11.5–15.5)
WBC Count: 7.9 10*3/uL (ref 4.0–10.5)
nRBC: 0 % (ref 0.0–0.2)

## 2021-12-14 LAB — CMP (CANCER CENTER ONLY)
ALT: 17 U/L (ref 0–44)
AST: 16 U/L (ref 15–41)
Albumin: 4.7 g/dL (ref 3.5–5.0)
Alkaline Phosphatase: 40 U/L (ref 38–126)
Anion gap: 9 (ref 5–15)
BUN: 23 mg/dL (ref 8–23)
CO2: 31 mmol/L (ref 22–32)
Calcium: 10.6 mg/dL — ABNORMAL HIGH (ref 8.9–10.3)
Chloride: 100 mmol/L (ref 98–111)
Creatinine: 0.86 mg/dL (ref 0.44–1.00)
GFR, Estimated: >60 mL/min (ref >60)
Glucose, Bld: 105 mg/dL — ABNORMAL HIGH (ref 70–99)
Potassium: 4.6 mmol/L (ref 3.5–5.1)
Sodium: 140 mmol/L (ref 135–145)
Total Bilirubin: 0.8 mg/dL (ref 0.3–1.2)
Total Protein: 7.1 g/dL (ref 6.5–8.1)

## 2021-12-14 LAB — LACTATE DEHYDROGENASE: LDH: 133 U/L (ref 98–192)
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
## 2021-12-17 ENCOUNTER — Ambulatory Visit: Payer: PPO | Attending: General Surgery | Admitting: Physical Therapy

## 2021-12-17 DIAGNOSIS — R293 Abnormal posture: Secondary | ICD-10-CM | POA: Diagnosis not present

## 2021-12-17 DIAGNOSIS — R279 Unspecified lack of coordination: Secondary | ICD-10-CM | POA: Insufficient documentation

## 2021-12-17 DIAGNOSIS — M6281 Muscle weakness (generalized): Secondary | ICD-10-CM | POA: Diagnosis not present
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
## 2021-12-17 NOTE — Patient Instructions (Signed)

## 2021-12-28 ENCOUNTER — Other Ambulatory Visit: Payer: Self-pay | Admitting: Family Medicine

## 2021-12-29 ENCOUNTER — Other Ambulatory Visit (HOSPITAL_BASED_OUTPATIENT_CLINIC_OR_DEPARTMENT_OTHER): Payer: Self-pay

## 2021-12-29 ENCOUNTER — Ambulatory Visit (INDEPENDENT_AMBULATORY_CARE_PROVIDER_SITE_OTHER): Payer: PPO | Admitting: Family Medicine

## 2021-12-29 ENCOUNTER — Encounter: Payer: Self-pay | Admitting: Family Medicine

## 2021-12-29 VITALS — BP 136/78 | HR 86 | Temp 98.3°F | Resp 18 | Ht 63.0 in | Wt 168.8 lb

## 2021-12-29 DIAGNOSIS — E1165 Type 2 diabetes mellitus with hyperglycemia: Secondary | ICD-10-CM | POA: Diagnosis not present

## 2021-12-29 DIAGNOSIS — I10 Essential (primary) hypertension: Secondary | ICD-10-CM

## 2021-12-29 DIAGNOSIS — E039 Hypothyroidism, unspecified: Secondary | ICD-10-CM | POA: Diagnosis not present

## 2021-12-29 DIAGNOSIS — C50412 Malignant neoplasm of upper-outer quadrant of left female breast: Secondary | ICD-10-CM | POA: Diagnosis not present

## 2021-12-29 DIAGNOSIS — B359 Dermatophytosis, unspecified: Secondary | ICD-10-CM

## 2021-12-29 DIAGNOSIS — B370 Candidal stomatitis: Secondary | ICD-10-CM

## 2021-12-29 DIAGNOSIS — E785 Hyperlipidemia, unspecified: Secondary | ICD-10-CM | POA: Diagnosis not present

## 2021-12-29 DIAGNOSIS — Z170 Estrogen receptor positive status [ER+]: Secondary | ICD-10-CM | POA: Diagnosis not present

## 2021-12-29 DIAGNOSIS — E1169 Type 2 diabetes mellitus with other specified complication: Secondary | ICD-10-CM | POA: Diagnosis not present

## 2021-12-29 LAB — CBC WITH DIFFERENTIAL/PLATELET
Basophils Absolute: 0.1 10*3/uL (ref 0.0–0.1)
Basophils Relative: 1 % (ref 0.0–3.0)
Eosinophils Absolute: 0.4 10*3/uL (ref 0.0–0.7)
Eosinophils Relative: 7.3 % — ABNORMAL HIGH (ref 0.0–5.0)
HCT: 39.9 % (ref 36.0–46.0)
Hemoglobin: 13.3 g/dL (ref 12.0–15.0)
Lymphocytes Relative: 38.4 % (ref 12.0–46.0)
Lymphs Abs: 2.2 10*3/uL (ref 0.7–4.0)
MCHC: 33.2 g/dL (ref 30.0–36.0)
MCV: 86.5 fl (ref 78.0–100.0)
Monocytes Absolute: 0.6 10*3/uL (ref 0.1–1.0)
Monocytes Relative: 10.4 % (ref 3.0–12.0)
Neutro Abs: 2.5 10*3/uL (ref 1.4–7.7)
Neutrophils Relative %: 42.9 % — ABNORMAL LOW (ref 43.0–77.0)
Platelets: 296 10*3/uL (ref 150.0–400.0)
RBC: 4.61 Mil/uL (ref 3.87–5.11)
RDW: 14.1 % (ref 11.5–15.5)
WBC: 5.8 10*3/uL (ref 4.0–10.5)

## 2021-12-29 LAB — MICROALBUMIN / CREATININE URINE RATIO
Creatinine,U: 45.1 mg/dL
Microalb Creat Ratio: 1.6 mg/g (ref 0.0–30.0)
Microalb, Ur: <0.7 mg/dL (ref 0.0–1.9)

## 2021-12-29 LAB — COMPREHENSIVE METABOLIC PANEL
ALT: 18 U/L (ref 0–35)
AST: 17 U/L (ref 0–37)
Albumin: 4.5 g/dL (ref 3.5–5.2)
Alkaline Phosphatase: 44 U/L (ref 39–117)
BUN: 17 mg/dL (ref 6–23)
CO2: 32 mEq/L (ref 19–32)
Calcium: 9.7 mg/dL (ref 8.4–10.5)
Chloride: 98 mEq/L (ref 96–112)
Creatinine, Ser: 0.68 mg/dL (ref 0.40–1.20)
GFR: 89.25 mL/min (ref >60.00)
Glucose, Bld: 106 mg/dL — ABNORMAL HIGH (ref 70–99)
Potassium: 4.2 mEq/L (ref 3.5–5.1)
Sodium: 137 mEq/L (ref 135–145)
Total Bilirubin: 0.6 mg/dL (ref 0.2–1.2)
Total Protein: 6.9 g/dL (ref 6.0–8.3)

## 2021-12-29 LAB — LIPID PANEL
Cholesterol: 163 mg/dL (ref 0–200)
HDL: 71.9 mg/dL (ref >39.00)
LDL Cholesterol: 76 mg/dL (ref 0–99)
NonHDL: 91.14
Total CHOL/HDL Ratio: 2
Triglycerides: 77 mg/dL (ref 0.0–149.0)
VLDL: 15.4 mg/dL (ref 0.0–40.0)

## 2021-12-29 LAB — HEMOGLOBIN A1C: Hgb A1c MFr Bld: 5.9 % (ref 4.6–6.5)

## 2021-12-29 LAB — TSH: TSH: 1.25 u[IU]/mL (ref 0.35–5.50)

## 2021-12-29 MED ORDER — METFORMIN HCL ER 500 MG PO TB24
500.0000 mg | ORAL_TABLET | Freq: Two times a day (BID) | ORAL | 1 refills | Status: DC
  Filled 2021-12-29: qty 180, 90d supply, fill #0

## 2021-12-29 MED ORDER — FLUCONAZOLE 150 MG PO TABS
150.0000 mg | ORAL_TABLET | Freq: Once | ORAL | 0 refills | Status: AC
  Filled 2021-12-29: qty 2, 3d supply, fill #0

## 2021-12-29 MED ORDER — NYSTATIN 100000 UNIT/ML MT SUSP
5.0000 mL | Freq: Four times a day (QID) | OROMUCOSAL | 0 refills | Status: DC
  Filled 2021-12-29: qty 60, 3d supply, fill #0

## 2021-12-29 NOTE — Assessment & Plan Note (Signed)
Per oncology °

## 2021-12-29 NOTE — Assessment & Plan Note (Signed)
Well controlled, no changes to meds. Encouraged heart healthy diet such as the DASH diet and exercise as tolerated.  °

## 2021-12-29 NOTE — Assessment & Plan Note (Signed)
hgba1c to be checked, minimize simple carbs. Increase exercise as tolerated. Continue current meds  

## 2021-12-29 NOTE — Assessment & Plan Note (Signed)
Check tsh Con't synthroid

## 2021-12-29 NOTE — Progress Notes (Signed)
Subjective:   By signing my name below, I, Michelle [name], attest that this documentation has been prepared under the direction and in the presence of [name], DO. 12/29/2021     Patient ID: Michelle [name], female    DOB: 1955/09/25, 66 y.o.   MRN: [id number]  Chief Complaint  Patient presents with   Diabetes   Hypothyroidism   Hyperlipidemia   Follow-up    Diabetes Pertinent negatives for hypoglycemia include no dizziness, headaches or nervousness/anxiousness. Pertinent negatives for diabetes include no chest pain and no weakness.  Hyperlipidemia Pertinent negatives include no chest pain, myalgias or shortness of breath.   Patient is in today for a follow up visit.   She complains of coating on her tongue. She thinks she may have developed a yeast infection on her tongue. She denies having any discharge. She has been eating more sugar recently. She has taken diflucan in the past and reports no issues while taking it.  She is currently in physical therapy for her pelvic area.  She reports gaining weight since her last visit. She was recently in [place] taking care of her mother and reports she had difficulty getting exercise while there. Her diet was worsened recently due to celebrating an anniversary.  Wt Readings from Last 3 Encounters:  12/29/21 168 lb 12.8 oz (76.6 kg)  12/14/21 165 lb (74.8 kg)  10/26/21 161 lb 6 oz (73.2 kg)   She continues taking Symbicort daily PO and reports no new issues with her breathing.   Past Medical History:  Diagnosis Date   Allergy    Asthma    Bronchitis    Cancer ([place])    COPD (chronic obstructive pulmonary disease) ([place])    mild   Diabetes mellitus without complication ([place])    Epstein Barr infection    Goals of care, counseling/discussion 06/30/2020   History of hiatal hernia    History of kidney stones    Hyperlipidemia    Hypertension    stress induced/ work makes it worse.   Hypothyroidism    Neuromuscular  disorder ([place])    Neuropathy   Obesity    Pneumonia    Thyroid disease     Past Surgical History:  Procedure Laterality Date   BREAST BIOPSY     BREAST EXCISIONAL BIOPSY Left 2022   BREAST LUMPECTOMY WITH RADIOACTIVE SEED AND SENTINEL LYMPH NODE BIOPSY Left 06/11/2020   Procedure: LEFT BREAST LUMPECTOMY WITH RADIOACTIVE SEED AND LEFT AXILLARY SENTINEL LYMPH NODE BIOPSY;  Surgeon: Rolm Bookbinder, MD;  Location: [facility];  Service: General;  Laterality: Left;   CATARACT EXTRACTION Right 10/02/2021   CATARACT EXTRACTION Left 07/31/2021   TONSILLECTOMY AND ADENOIDECTOMY  childhood   TUBAL LIGATION     VAGINAL HYSTERECTOMY  2011    Family History  Problem Relation Age of Onset   Heart disease Mother    Hyperlipidemia Mother    Stroke Mother    Hypertension Mother    Heart disease Father    Colon polyps Brother    Coronary artery disease Brother    Heart disease Brother    Prostate cancer Brother    Colon cancer Neg Hx    Stomach cancer Neg Hx    Rectal cancer Neg Hx    Esophageal cancer Neg Hx     Social History   Socioeconomic History   Marital status: Married    Spouse name: Not on file   Number of children: Not on file  Years of education: Not on file   Highest education level: Not on file  Occupational History   Occupation: RN  Tobacco Use   Smoking status: Never   Smokeless tobacco: Never  Vaping Use   Vaping Use: Never used  Substance and Sexual Activity   Alcohol use: No   Drug use: No   Sexual activity: Yes  Other Topics Concern   Not on file  Social History Narrative   Exercise- Walks about an hour a day   Right handed   Lives husband one story   Social Determinants of Health   Financial Resource Strain: Low Risk  (11/04/2020)   Overall Financial Resource Strain (CARDIA)    Difficulty of Paying Living Expenses: Not hard at all  Food Insecurity: No Food Insecurity (11/04/2020)   Hunger Vital Sign    Worried About Running Out of Food in the Last  Year: Never true    [place] in the Last Year: Never true  Transportation Needs: No Transportation Needs (11/04/2020)   PRAPARE - Hydrologist (Medical): No    Lack of Transportation (Non-Medical): No  Physical Activity: Sufficiently Active (11/04/2020)   Exercise Vital Sign    Days of Exercise per Week: 7 days    Minutes of Exercise per Session: 30 min  Stress: Stress Concern Present (11/04/2020)   [place]    Feeling of Stress : To some extent  Social Connections: Socially Integrated (11/04/2020)   Social Connection and Isolation Panel [NHANES]    Frequency of Communication with Friends and Family: More than three times a week    Frequency of Social Gatherings with Friends and Family: More than three times a week    Attends Religious Services: 1 to 4 times per year    Active Member of [employer] or Organizations: Yes    Attends Archivist Meetings: 1 to 4 times per year    Marital Status: Married  Human resources officer Violence: Not At Risk (06/05/2020)   Humiliation, Afraid, Rape, and Kick questionnaire    Fear of Current or Ex-Partner: No    Emotionally Abused: No    Physically Abused: No    Sexually Abused: No    Outpatient Medications Prior to Visit  Medication Sig Dispense Refill   ALBUTEROL IN 07/21/2021 1-2 puffs every 4-6 hours prn.     Alpha-Lipoic Acid 600 MG TABS Take 600 mg by mouth daily.     amLODipine (NORVASC) 5 MG tablet Take 1 tablet (5 mg total) by mouth daily. 90 tablet 1   Ascorbic Acid (VITAMIN C) 1000 MG tablet Take 1,000 mg by mouth 2 (two) times daily.     ASPIRIN 81 PO Take by mouth daily.     blood glucose meter kit and supplies KIT Use to check blood glucose once daily 1 each 0   budesonide-formoterol (SYMBICORT) 80-4.5 MCG/ACT inhaler Inhale 2 puffs into the lungs in the morning and at bedtime. 10.2 g 5   Calcium-Magnesium (CAL-MAG PO) Take 1 tablet by  mouth 3 (three) times daily.     Cholecalciferol (DIALYVITE VITAMIN D 5000) 125 MCG (5000 UT) capsule Take 5,000 Units by mouth daily.     CHROMIUM PO Take 2 tablets by mouth 2 (two) times daily.     CINNAMON PO Take 125 mg by mouth 3 (three) times daily.     co-enzyme Q-10 50 MG capsule Take 100 mg by mouth daily.  Digestive Enzyme CAPS Take 1 capsule by mouth 3 (three) times daily with meals.     fluticasone (FLONASE) 50 MCG/ACT nasal spray Place 1 spray into both nostrils 2 (two) times daily.     GARLIC PO Take 1 tablet by mouth 3 (three) times daily.     glucose blood test strip Use to check blood glucose once daily 100 each 0   hydrochlorothiazide (HYDRODIURIL) 25 MG tablet Take 1 tablet (25 mg total) by mouth daily. 90 tablet 1   Lancets (ONETOUCH ULTRASOFT) lancets Use to check blood glucose once daily 100 each 0   loratadine (CLARITIN) 10 MG tablet Take 10 mg by mouth at bedtime.     Melatonin 10 MG CAPS Take 20 mg by mouth at bedtime.     Menaquinone-7 (VITAMIN K2 PO) Take 1 tablet by mouth daily.     Methylcobalamin 50000 MCG SOLR Inject 5,000 mcg as directed 2 (two) times a week. Tuesdays and Saturdays     MULTIPLE VITAMIN PO Take 2 capsules by mouth in the morning, at noon, and at bedtime.     NALTREXONE HCL PO Take 3 mg by mouth at bedtime. Natrexone er 1.5 mg capsules x 2     Omega-3 Fatty Acids (THE VERY FINEST FISH OIL) LIQD Take 15 mLs by mouth daily.     OVER THE COUNTER MEDICATION 10 mg at bedtime. CBD gummy.     POTASSIUM IODIDE PO Take 225 mcg by mouth 3 (three) times daily.     Probiotic Product (PROBIOTIC PO) Take 1 capsule by mouth at bedtime.     QUERCETIN PO Take 1 tablet by mouth 3 (three) times daily.     simvastatin (ZOCOR) 20 MG tablet Take 1 tablet (20 mg total) by mouth daily. 90 tablet 1   thyroid (NP THYROID) 90 MG tablet Take 1/2 tablet by mouth daily 100 tablet 3   TURMERIC PO Take 1,500 mg by mouth 2 (two) times daily.     VITAMIN A PO Take 20,000  Units by mouth daily.     VITAMIN E PO Take 260 mg by mouth daily.     Zinc 50 MG TABS Take 50 mg by mouth daily.     blood glucose meter kit and supplies KIT As directed 1 each 0   Blood Glucose Monitoring Suppl (FREESTYLE LITE) w/Device KIT Use as directed 1 kit 0   cefdinir (OMNICEF) 300 MG capsule Take 1 capsule (300 mg total) by mouth 2 (two) times daily. 20 capsule 0   glucose blood (FREESTYLE LITE) test strip Test blood sugars 3 times daily 100 each 2   ketorolac (ACULAR) 0.5 % ophthalmic solution Place 1 drop into the left eye 2 times daily as directed 5 mL 1   ketorolac (ACULAR) 0.5 % ophthalmic solution Place 1 drop into the left eye 4 (four) times daily as directed. 5 mL 1   Lancets (FREESTYLE) lancets Use as directed 100 each 0   metFORMIN (GLUCOPHAGE-XR) 500 MG 24 hr tablet Take 1 tablet (500 mg total) by mouth 2 (two) times daily. 180 tablet 1   moxifloxacin (VIGAMOX) 0.5 % ophthalmic solution Place 1 drop into the right eye 4 times daily 3 mL 1   prednisoLONE acetate (PRED FORTE) 1 % ophthalmic suspension Place 1 drop into the right eye 4 times daily as directed. **Shake well before each use** 5 mL 1   predniSONE (DELTASONE) 10 MG tablet TAKE 3 TABLETS BY MOUTH DAILY FOR 3 DAYS, THEN TAKE  2 TABLETS DAILY FOR 3 DAYS, THEN TAKE 1 TABLET DAILY FOR 3 DAYS,THEN TAKE 1/2 TABLET DAILY FOR 3 DAYS 20 tablet 0   predniSONE (DELTASONE) 20 MG tablet Take 1 tablet (20 mg total) by mouth 2 (two) times daily with a meal. 10 tablet 0   No facility-administered medications prior to visit.    Allergies  Allergen Reactions   Influenza Vaccine Live Other (See Comments)    Sever flu symptoms, chest tightness, wheezing   Shrimp [Shellfish Allergy] Itching    Allergic to shrimp.  Wheezing and tightness in chest.   Tdap [Tetanus-Diphth-Acell Pertussis] Other (See Comments)    Change in level of consciousness, altered mental state   Demerol [Meperidine] Nausea And Vomiting   Peanut-Containing Drug  Products Other (See Comments)    wheezing   Chlorhexidine Gluconate [Chlorhexidine] Rash    Patient states she is sensitive to CHG solution and prefers not to use it   Latex Rash    Review of Systems  Constitutional:  Negative for chills, fever and malaise/fatigue.  HENT:  Negative for congestion and hearing loss.        (+)coating on tongue  Eyes:  Negative for discharge.  Respiratory:  Negative for cough, sputum production and shortness of breath.   Cardiovascular:  Negative for chest pain, palpitations and leg swelling.  Gastrointestinal:  Negative for abdominal pain, blood in stool, constipation, diarrhea, heartburn, nausea and vomiting.  Genitourinary:  Negative for dysuria, frequency, hematuria and urgency.       (-)Discharge   Musculoskeletal:  Negative for back pain, falls and myalgias.  Skin:  Negative for rash.  Neurological:  Negative for dizziness, sensory change, loss of consciousness, weakness and headaches.  Endo/Heme/Allergies:  Negative for environmental allergies. Does not bruise/bleed easily.  Psychiatric/Behavioral:  Negative for depression and suicidal ideas. The patient is not nervous/anxious and does not have insomnia.        Objective:    Physical Exam Vitals and nursing note reviewed.  Constitutional:      General: She is not in acute distress.    Appearance: Normal appearance. She is well-developed. She is not ill-appearing.  HENT:     Head: Normocephalic and atraumatic.     Right Ear: External ear normal.     Left Ear: External ear normal.  Eyes:     Extraocular Movements: Extraocular movements intact.     Conjunctiva/sclera: Conjunctivae normal.     Pupils: Pupils are equal, round, and reactive to light.  Neck:     Thyroid: No thyromegaly.     Vascular: No carotid bruit or JVD.  Cardiovascular:     Rate and Rhythm: Normal rate and regular rhythm.     Heart sounds: Normal heart sounds. No murmur heard.    No gallop.  Pulmonary:     Effort:  Pulmonary effort is normal. No respiratory distress.     Breath sounds: Normal breath sounds. No wheezing or rales.  Chest:     Chest wall: No tenderness.  Musculoskeletal:     Cervical back: Normal range of motion and neck supple.  Skin:    General: Skin is warm and dry.  Neurological:     Mental Status: She is alert and oriented to person, place, and time.  Psychiatric:        Judgment: Judgment normal.     BP 136/78 (BP Location: Right Arm, Patient Position: Sitting, Cuff Size: Normal)   Pulse 86   Temp 98.3 F (36.8 C) (Oral)  Resp 18   Ht _0  (1.6 m)   Wt 168 lb 12.8 oz (76.6 kg)   SpO2 97%   BMI 29.90 kg/m  Wt Readings from Last 3 Encounters:  12/29/21 168 lb 12.8 oz (76.6 kg)  12/14/21 165 lb (74.8 kg)  10/26/21 161 lb 6 oz (73.2 kg)    Diabetic Foot Exam - Simple   Simple Foot Form Diabetic Foot exam was performed with the following findings: Yes 12/29/2021  9:31 AM  Visual Inspection No deformities, no ulcerations, no other skin breakdown bilaterally: Yes Sensation Testing Intact to touch and monofilament testing bilaterally: Yes Pulse Check Posterior Tibialis and Dorsalis pulse intact bilaterally: Yes Comments    Lab Results  Component Value Date   WBC 7.9 12/14/2021   HGB 13.8 12/14/2021   HCT 42.1 12/14/2021   PLT 304 12/14/2021   GLUCOSE 105 (H) 12/14/2021   CHOL 148 06/16/2021   TRIG 72.0 06/16/2021   HDL 65.20 06/16/2021   LDLCALC 68 06/16/2021   ALT 17 12/14/2021   AST 16 12/14/2021   NA 140 12/14/2021   K 4.6 12/14/2021   CL 100 12/14/2021   CREATININE 0.86 12/14/2021   BUN 23 12/14/2021   CO2 31 12/14/2021   TSH 1.28 11/14/2020   TSH 1.24 11/14/2020   HGBA1C 5.7 06/16/2021   MICROALBUR 0.9 06/16/2021    Lab Results  Component Value Date   TSH 1.28 11/14/2020   TSH 1.24 11/14/2020   Lab Results  Component Value Date   WBC 7.9 12/14/2021   HGB 13.8 12/14/2021   HCT 42.1 12/14/2021   MCV 88.1 12/14/2021   PLT 304  12/14/2021   Lab Results  Component Value Date   NA 140 12/14/2021   K 4.6 12/14/2021   CO2 31 12/14/2021   GLUCOSE 105 (H) 12/14/2021   BUN 23 12/14/2021   CREATININE 0.86 12/14/2021   BILITOT 0.8 12/14/2021   ALKPHOS 40 12/14/2021   AST 16 12/14/2021   ALT 17 12/14/2021   PROT 7.1 12/14/2021   ALBUMIN 4.7 12/14/2021   CALCIUM 10.6 (H) 12/14/2021   ANIONGAP 9 12/14/2021   GFR 79.35 06/16/2021   Lab Results  Component Value Date   CHOL 148 06/16/2021   Lab Results  Component Value Date   HDL 65.20 06/16/2021   Lab Results  Component Value Date   LDLCALC 68 06/16/2021   Lab Results  Component Value Date   TRIG 72.0 06/16/2021   Lab Results  Component Value Date   CHOLHDL 2 06/16/2021   Lab Results  Component Value Date   HGBA1C 5.7 06/16/2021       Assessment & Plan:   Problem List Items Addressed This Visit       Unprioritized   Uncontrolled type 2 diabetes mellitus with hyperglycemia ([place])    hgba1c to be checked , minimize simple carbs. Increase exercise as tolerated. Continue current meds       Relevant Medications   metFORMIN (GLUCOPHAGE-XR) 500 MG 24 hr tablet   Malignant neoplasm of upper-outer quadrant of left breast in female, estrogen receptor positive ([place])    Per oncology      Relevant Medications   fluconazole (DIFLUCAN) 150 MG tablet   Hypothyroidism    Check tsh Con't synthroid        Relevant Orders   TSH   Hyperlipidemia    Encourage heart healthy diet such as MIND or DASH diet, increase exercise, avoid trans fats, simple carbohydrates and processed foods,  consider a krill or fish or flaxseed oil cap daily.        HTN (hypertension) - Primary    Well controlled, no changes to meds. Encouraged heart healthy diet such as the DASH diet and exercise as tolerated.        Relevant Orders   CBC with Differential/Platelet   Comprehensive metabolic panel   Hemoglobin A1c   Lipid panel   Microalbumin / creatinine urine ratio    TSH   Other Visit Diagnoses     Type 2 diabetes mellitus with hyperglycemia, without long-term current use of insulin ([facility])       Relevant Medications   metFORMIN (GLUCOPHAGE-XR) 500 MG 24 hr tablet   Other Relevant Orders   CBC with Differential/Platelet   Comprehensive metabolic panel   Hemoglobin A1c   Lipid panel   Microalbumin / creatinine urine ratio   TSH   Hyperlipidemia associated with type 2 diabetes mellitus ([facility])       Relevant Medications   metFORMIN (GLUCOPHAGE-XR) 500 MG 24 hr tablet   Other Relevant Orders   Comprehensive metabolic panel   Hemoglobin A1c   Lipid panel   Tinea       Relevant Medications   fluconazole (DIFLUCAN) 150 MG tablet   nystatin (MYCOSTATIN) 100000 UNIT/ML suspension   Thrush       Relevant Medications   fluconazole (DIFLUCAN) 150 MG tablet   nystatin (MYCOSTATIN) 100000 UNIT/ML suspension        Meds ordered this encounter  Medications   metFORMIN (GLUCOPHAGE-XR) 500 MG 24 hr tablet    Sig: Take 1 tablet (500 mg total) by mouth 2 (two) times daily.    Dispense:  180 tablet    Refill:  1   fluconazole (DIFLUCAN) 150 MG tablet    Sig: Take 1 tablet (150 mg total) by mouth once for 1 dose; may repeat in 3 days as needed.    Dispense:  2 tablet    Refill:  0   nystatin (MYCOSTATIN) 100000 UNIT/ML suspension    Sig: Take 5 mLs (500,000 Units total) by mouth 4 (four) times daily.    Dispense:  60 mL    Refill:  0    Swish and swallow    I, [name], DO, personally preformed the services described in this documentation.  All medical record entries made by the scribe were at my direction and in my presence.  I have reviewed the chart and discharge instructions (if applicable) and agree that the record reflects my personal performance and is accurate and complete. 12/29/2021   I,Michelle [name],acting as a scribe for [name], DO.,have documented all relevant documentation on the behalf of [name], DO,as directed by  [name], DO while in the presence of [name], DO.   [name], DO

## 2021-12-29 NOTE — Assessment & Plan Note (Signed)
Encourage heart healthy diet such as MIND or DASH diet, increase exercise, avoid trans fats, simple carbohydrates and processed foods, consider a krill or fish or flaxseed oil cap daily.  °

## 2021-12-29 NOTE — Patient Instructions (Signed)

## 2022-01-05 ENCOUNTER — Ambulatory Visit: Payer: PPO | Admitting: Physical Therapy

## 2022-01-05 DIAGNOSIS — M6281 Muscle weakness (generalized): Secondary | ICD-10-CM

## 2022-01-05 DIAGNOSIS — R279 Unspecified lack of coordination: Secondary | ICD-10-CM

## 2022-01-05 DIAGNOSIS — R293 Abnormal posture: Secondary | ICD-10-CM | POA: Diagnosis not present
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
## 2022-01-18 ENCOUNTER — Ambulatory Visit: Payer: PPO | Attending: General Surgery | Admitting: Physical Therapy

## 2022-01-18 ENCOUNTER — Other Ambulatory Visit (HOSPITAL_BASED_OUTPATIENT_CLINIC_OR_DEPARTMENT_OTHER): Payer: Self-pay

## 2022-01-18 DIAGNOSIS — R293 Abnormal posture: Secondary | ICD-10-CM | POA: Diagnosis not present

## 2022-01-18 DIAGNOSIS — Z483 Aftercare following surgery for neoplasm: Secondary | ICD-10-CM | POA: Insufficient documentation

## 2022-01-18 DIAGNOSIS — R279 Unspecified lack of coordination: Secondary | ICD-10-CM | POA: Diagnosis not present

## 2022-01-18 DIAGNOSIS — M6281 Muscle weakness (generalized): Secondary | ICD-10-CM | POA: Insufficient documentation
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
## 2022-01-19 ENCOUNTER — Other Ambulatory Visit (HOSPITAL_BASED_OUTPATIENT_CLINIC_OR_DEPARTMENT_OTHER): Payer: Self-pay

## 2022-01-19 DIAGNOSIS — H18413 Arcus senilis, bilateral: Secondary | ICD-10-CM | POA: Diagnosis not present

## 2022-01-19 DIAGNOSIS — H26493 Other secondary cataract, bilateral: Secondary | ICD-10-CM | POA: Diagnosis not present

## 2022-01-19 DIAGNOSIS — Z961 Presence of intraocular lens: Secondary | ICD-10-CM | POA: Diagnosis not present

## 2022-01-19 DIAGNOSIS — H26492 Other secondary cataract, left eye: Secondary | ICD-10-CM | POA: Diagnosis not present

## 2022-01-19 MED ORDER — PREDNISOLONE ACETATE 1 % OP SUSP
1.0000 [drp] | Freq: Four times a day (QID) | OPHTHALMIC | 0 refills | Status: DC
  Filled 2022-01-19: qty 5, 20d supply, fill #0

## 2022-01-27 DIAGNOSIS — Z961 Presence of intraocular lens: Secondary | ICD-10-CM | POA: Diagnosis not present

## 2022-01-27 DIAGNOSIS — Z9842 Cataract extraction status, left eye: Secondary | ICD-10-CM | POA: Diagnosis not present

## 2022-01-27 DIAGNOSIS — H52223 Regular astigmatism, bilateral: Secondary | ICD-10-CM | POA: Diagnosis not present

## 2022-01-27 DIAGNOSIS — H524 Presbyopia: Secondary | ICD-10-CM | POA: Diagnosis not present

## 2022-01-27 DIAGNOSIS — H26491 Other secondary cataract, right eye: Secondary | ICD-10-CM | POA: Diagnosis not present

## 2022-02-01 ENCOUNTER — Ambulatory Visit: Payer: PPO

## 2022-02-01 ENCOUNTER — Ambulatory Visit: Payer: PPO | Admitting: Physical Therapy

## 2022-02-01 VITALS — Wt 172.4 lb

## 2022-02-01 DIAGNOSIS — Z483 Aftercare following surgery for neoplasm: Secondary | ICD-10-CM

## 2022-02-01 DIAGNOSIS — R293 Abnormal posture: Secondary | ICD-10-CM

## 2022-02-01 DIAGNOSIS — R279 Unspecified lack of coordination: Secondary | ICD-10-CM

## 2022-02-01 DIAGNOSIS — M6281 Muscle weakness (generalized): Secondary | ICD-10-CM
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
## 2022-02-16 ENCOUNTER — Other Ambulatory Visit (HOSPITAL_BASED_OUTPATIENT_CLINIC_OR_DEPARTMENT_OTHER): Payer: Self-pay

## 2022-02-17 ENCOUNTER — Ambulatory Visit: Payer: PPO | Admitting: Physical Therapy

## 2022-02-19 ENCOUNTER — Other Ambulatory Visit (HOSPITAL_BASED_OUTPATIENT_CLINIC_OR_DEPARTMENT_OTHER): Payer: Self-pay

## 2022-02-19 ENCOUNTER — Ambulatory Visit
Admission: EM | Admit: 2022-02-19 | Discharge: 2022-02-19 | Disposition: A | Discharge status: Home / Self Care | Payer: PPO | Attending: Family Medicine | Admitting: Family Medicine

## 2022-02-19 ENCOUNTER — Encounter: Payer: Self-pay | Admitting: Emergency Medicine

## 2022-02-19 DIAGNOSIS — K1121 Acute sialoadenitis: Secondary | ICD-10-CM | POA: Diagnosis not present

## 2022-02-19 MED ORDER — FLUCONAZOLE 150 MG PO TABS
ORAL_TABLET | ORAL | 0 refills | Status: DC
  Filled 2022-02-19: qty 2, 3d supply, fill #0

## 2022-02-19 MED ORDER — AMOXICILLIN-POT CLAVULANATE 875-125 MG PO TABS
1.0000 | ORAL_TABLET | Freq: Two times a day (BID) | ORAL | 0 refills | Status: DC
  Filled 2022-02-19: qty 20, 10d supply, fill #0

## 2022-02-19 NOTE — ED Provider Notes (Signed)
Michelle [name] CARE    CSN: [id number] Arrival date & time: 02/19/22  1413      History   Chief Complaint Chief Complaint  Patient presents with   Facial Pain    HPI Michelle [name] is a 67 y.o. female.   Patient noticed sudden swelling in her left face today and wonders if she might have a sinus infection.  She has felt fatigued today but feels well otherwise.  She noticed that her left face swelled more while she was eating lunch.  The history is provided by the patient.    Past Medical History:  Diagnosis Date   Allergy    Asthma    Bronchitis    Cancer ([place])    COPD (chronic obstructive pulmonary disease) ([place])    mild   Diabetes mellitus without complication ([place])    Epstein Barr infection    Goals of care, counseling/discussion 06/30/2020   History of hiatal hernia    History of kidney stones    Hyperlipidemia    Hypertension    stress induced/ work makes it worse.   Hypothyroidism    Neuromuscular disorder ([place])    Neuropathy   Obesity    Pneumonia    Thyroid disease     Patient Active Problem List   Diagnosis Date Noted   DM (diabetes mellitus) type II uncontrolled, periph vascular disorder 11/14/2020   Goals of care, counseling/discussion 06/30/2020   Malignant neoplasm of upper-outer quadrant of left breast in female, estrogen receptor positive ([place]) 05/14/2020   Right leg pain 04/24/2019   Uncontrolled type 2 diabetes mellitus with hyperglycemia ([place]) 04/24/2019   Vitamin D deficiency 04/24/2019   Low back pain 11/13/2018   Whiplash injury to neck 10/26/2018   Close exposure to COVID-19 virus 10/26/2018   Overweight (BMI 25.0-29.9) 07/12/2017   Preventative health care 09/06/2016   Diabetes mellitus without complication ([place]) 56/43/3295   Pyelonephritis 02/24/2016   Recurrent sinusitis 05/12/2015   Upper airway cough syndrome 10/16/2014   Dyspnea 10/15/2014   Renal calculi  incidental on CT imaging 03/14/2014   Borderline diabetes  DX  Dr Sharol Roussel 03/11/2014   HTN (hypertension) 03/26/2013   Hyperlipidemia 03/26/2013   Osteopenia 03/26/2013   Hypothyroidism 03/26/2013   Menopausal symptoms 02/24/2012   Status post hysterectomy 02/24/2012    Past Surgical History:  Procedure Laterality Date   BREAST BIOPSY     BREAST EXCISIONAL BIOPSY Left 2022   BREAST LUMPECTOMY WITH RADIOACTIVE SEED AND SENTINEL LYMPH NODE BIOPSY Left 06/11/2020   Procedure: LEFT BREAST LUMPECTOMY WITH RADIOACTIVE SEED AND LEFT AXILLARY SENTINEL LYMPH NODE BIOPSY;  Surgeon: Rolm Bookbinder, MD;  Location: [facility];  Service: General;  Laterality: Left;   CATARACT EXTRACTION Right 10/02/2021   CATARACT EXTRACTION Left 07/31/2021   TONSILLECTOMY AND ADENOIDECTOMY  childhood   TUBAL LIGATION     VAGINAL HYSTERECTOMY  2011    OB History     Gravida  7   Para  4   Term      Preterm      AB  3   Living  4      SAB  3   IAB  0   Ectopic      Multiple      Live Births               Home Medications    Prior to Admission medications   Medication Sig Start Date End Date Taking? Authorizing Provider  amoxicillin-clavulanate (AUGMENTIN) 875-125 MG  tablet Take 1 tablet by mouth every 12 (twelve) hours with food. 02/19/22  Yes Kandra Nicolas, MD  fluconazole (DIFLUCAN) 150 MG tablet Take one tablet by mouth once for one dose.  May repeat in 72 hours. 02/19/22  Yes Kandra Nicolas, MD  ALBUTEROL IN 07/21/2021 1-2 puffs every 4-6 hours prn.    [provider]  Alpha-Lipoic Acid 600 MG TABS Take 600 mg by mouth daily.    [provider]  amLODipine (NORVASC) 5 MG tablet Take 1 tablet (5 mg total) by mouth daily. 12/01/21   [name], DO  Ascorbic Acid (VITAMIN C) 1000 MG tablet Take 1,000 mg by mouth 2 (two) times daily.    [provider]  ASPIRIN 81 PO Take by mouth daily.    [provider]  blood glucose meter kit and supplies KIT Use to check blood glucose once daily 07/30/21   Carollee Herter, Alferd Apa, DO  budesonide-formoterol (SYMBICORT) 80-4.5 MCG/ACT inhaler Inhale 2 puffs into the lungs in the morning and at bedtime. 12/01/21   [name], DO  Calcium-Magnesium (CAL-MAG PO) Take 1 tablet by mouth 3 (three) times daily.    [provider]  Cholecalciferol (DIALYVITE VITAMIN D 5000) 125 MCG (5000 UT) capsule Take 5,000 Units by mouth daily.    [provider]  CHROMIUM PO Take 2 tablets by mouth 2 (two) times daily.    [provider]  CINNAMON PO Take 125 mg by mouth 3 (three) times daily.    [provider]  co-enzyme Q-10 50 MG capsule Take 100 mg by mouth daily.    [provider]  Digestive Enzyme CAPS Take 1 capsule by mouth 3 (three) times daily with meals.    [provider]  fluticasone (FLONASE) 50 MCG/ACT nasal spray Place 1 spray into both nostrils 2 (two) times daily.    [provider]  GARLIC PO Take 1 tablet by mouth 3 (three) times daily.    [provider]  glucose blood test strip Use to check blood glucose once daily 07/30/21   Carollee Herter, Alferd Apa, DO  hydrochlorothiazide (HYDRODIURIL) 25 MG tablet Take 1 tablet (25 mg total) by mouth daily. 12/01/21   [name], DO  Lancets (ONETOUCH ULTRASOFT) lancets Use to check blood glucose once daily 07/30/21   Carlyle Basques, MD  loratadine (CLARITIN) 10 MG tablet Take 10 mg by mouth at bedtime.    [provider]  Melatonin 10 MG CAPS Take 20 mg by mouth at bedtime.    [provider]  Menaquinone-7 (VITAMIN K2 PO) Take 1 tablet by mouth daily.    [provider]  metFORMIN (GLUCOPHAGE-XR) 500 MG 24 hr tablet Take 1 tablet (500 mg total) by mouth 2 (two) times daily. 12/29/21   [name], DO  Methylcobalamin 50000 MCG SOLR Inject 5,000 mcg as directed 2 (two) times a week. Tuesdays and Saturdays    [provider]  MULTIPLE VITAMIN PO Take 2 capsules by mouth in the  morning, at noon, and at bedtime.    [provider]  NALTREXONE HCL PO Take 3 mg by mouth at bedtime. Natrexone er 1.5 mg capsules x 2    [provider]  nystatin (MYCOSTATIN) 100000 UNIT/ML suspension Take 5 mLs (500,000 Units total) by mouth 4 (four) times daily. 12/29/21   [name], DO  Omega-3 Fatty Acids (THE VERY FINEST FISH OIL) LIQD  Take 15 mLs by mouth daily.    [provider]  OVER THE COUNTER MEDICATION 10 mg at bedtime. CBD gummy.    [provider]  POTASSIUM IODIDE PO Take 225 mcg by mouth 3 (three) times daily.    [provider]  prednisoLONE acetate (PRED FORTE) 1 % ophthalmic suspension Place 1 drop into the affected eye 4 (four) times daily for 5 days. 01/19/22     Probiotic Product (PROBIOTIC PO) Take 1 capsule by mouth at bedtime.    [provider]  QUERCETIN PO Take 1 tablet by mouth 3 (three) times daily.    [provider]  simvastatin (ZOCOR) 20 MG tablet Take 1 tablet (20 mg total) by mouth daily. 12/01/21   [name], DO  thyroid (NP THYROID) 90 MG tablet Take 1/2 tablet by mouth daily 04/09/21     TURMERIC PO Take 1,500 mg by mouth 2 (two) times daily.    [provider]  VITAMIN A PO Take 20,000 Units by mouth daily.    [provider]  VITAMIN E PO Take 260 mg by mouth daily.    [provider]  Zinc 50 MG TABS Take 50 mg by mouth daily.    [provider]    Family History Family History  Problem Relation Age of Onset   Heart disease Mother    Hyperlipidemia Mother    Stroke Mother    Hypertension Mother    Heart disease Father    Colon polyps Brother    Coronary artery disease Brother    Heart disease Brother    Prostate cancer Brother    Colon cancer Neg Hx    Stomach cancer Neg Hx    Rectal cancer Neg Hx    Esophageal cancer Neg Hx     Social History Social History   Tobacco Use   Smoking status: Never   Smokeless  tobacco: Never  Vaping Use   Vaping Use: Never used  Substance Use Topics   Alcohol use: No   Drug use: No     Allergies   Influenza vaccine live, Shrimp [shellfish allergy], Tdap [tetanus-diphth-acell pertussis], Demerol [meperidine], Peanut-containing drug products, Chlorhexidine gluconate [chlorhexidine], and Latex   Review of Systems Review of Systems No sore throat No cough No pleuritic pain No wheezing + nasal congestion + post-nasal drainage + sinus pain/pressure No itchy/red eyes No earache No hemoptysis No SOB No fever/chills No nausea No vomiting No abdominal pain No diarrhea No urinary symptoms No skin rash + fatigue No myalgias No headache Used OTC meds without relief  Physical Exam Triage Vital Signs ED Triage Vitals  Enc Vitals Group     BP 02/19/22 1607 (!) 176/80     Pulse Rate 02/19/22 1607 94     Resp 02/19/22 1607 16     Temp 02/19/22 1607 98.7 F (37.1 C)     Temp Source 02/19/22 1607 Oral     SpO2 02/19/22 1607 98 %     Weight 02/19/22 1609 172 lb 6.4 oz (78.2 kg)     Height 02/19/22 1609 '5\' 3"'$  (1.6 m)     Head Circumference --      Peak Flow --      Pain Score 02/19/22 1608 2     Pain Loc --      Pain Edu? --      Excl. in [place]? --    No data found.  Updated Vital Signs BP (!) 176/80 (  BP Location: Right Arm) Comment: HTN hx - on meds  Pulse 94   Temp 98.7 F (37.1 C) (Oral)   Resp 16   Ht '5\' 3"'$  (1.6 m)   Wt 78.2 kg   SpO2 98%   BMI 30.54 kg/m   Visual Acuity Right Eye Distance:   Left Eye Distance:   Bilateral Distance:    Right Eye Near:   Left Eye Near:    Bilateral Near:     Physical Exam Vitals and nursing note reviewed.  Constitutional:      General: She is not in acute distress. HENT:     Head: Normocephalic.      Comments: Tenderness to palpation over the left parotid gland without erythema or warmth.    Right Ear: Tympanic membrane, ear canal and external ear normal.     Left Ear: Tympanic membrane,  ear canal and external ear normal.     Nose: Nose normal.     Mouth/Throat:     Mouth: Mucous membranes are moist.     Pharynx: Oropharynx is clear.  Eyes:     Extraocular Movements: Extraocular movements intact.     Conjunctiva/sclera: Conjunctivae normal.     Pupils: Pupils are equal, round, and reactive to light.  Cardiovascular:     Rate and Rhythm: Normal rate.  Pulmonary:     Effort: Pulmonary effort is normal.  Musculoskeletal:     Cervical back: Neck supple.     Right lower leg: No edema.     Left lower leg: No edema.  Lymphadenopathy:     Cervical: No cervical adenopathy.  Skin:    General: Skin is warm and dry.     Findings: No erythema.  Neurological:     General: No focal deficit present.     Mental Status: She is alert.      UC Treatments / Results  Labs (all labs ordered are listed, but only abnormal results are displayed) Labs Reviewed - No data to display  EKG   Radiology No results found.  Procedures Procedures (including critical care time)  Medications Ordered in UC Medications - No data to display  Initial Impression / Assessment and Plan / UC Course  I have reviewed the triage vital signs and the nursing notes.  Pertinent labs & imaging results that were available during my care of the patient were reviewed by me and considered in my medical decision making (see chart for details).    Begin Augmentin.  Rx for Diflucan at patient's request. Followup with ENT if not improving in one week.  Final Clinical Impressions(s) / UC Diagnoses   Final diagnoses:  Parotitis, acute     Discharge Instructions      Apply heat to the affected area 2 or 3 times daily.  Use a moist heat pack or a heating pad. Place a towel between your skin and the heat source. Leave the heat on for 20-30 minutes. Remove the heat if your skin turns bright red. This is especially important if you are unable to feel pain, heat, or cold. You have a greater risk of  getting burned. Gargle with a mixture of salt and water 3-4 times a day or as needed. To make salt water, completely dissolve -1 tsp (3-6 g) of salt in 1 cup (237 mL) of warm water. Gently massage the parotid gland several times daily. Try sucking on sour candies several times daily.    ED Prescriptions     Medication Sig Dispense  Auth. Provider   amoxicillin-clavulanate (AUGMENTIN) 875-125 MG tablet Take 1 tablet by mouth every 12 (twelve) hours with food. 20 tablet Kandra Nicolas, MD   fluconazole (DIFLUCAN) 150 MG tablet Take one tablet by mouth once for one dose.  May repeat in 72 hours. 2 tablet Kandra Nicolas, MD         Kandra Nicolas, MD 02/20/22 [phone]

## 2022-02-19 NOTE — ED Triage Notes (Signed)
Saw a chiropractor yesterday for an adjustment swelling noted to the left side of her face at noon 1 GM of tylenol & 1 aleve at 1000 for general aches  Pt flying out to [place] at 0500 No BP  on left arm

## 2022-02-19 NOTE — Discharge Instructions (Signed)
Apply heat to the affected area 2 or 3 times daily.  Use a moist heat pack or a heating pad. Place a towel between your skin and the heat source. Leave the heat on for 20-30 minutes. Remove the heat if your skin turns bright red. This is especially important if you are unable to feel pain, heat, or cold. You have a greater risk of getting burned. Gargle with a mixture of salt and water 3-4 times a day or as needed. To make salt water, completely dissolve -1 tsp (3-6 g) of salt in 1 cup (237 mL) of warm water. Gently massage the parotid gland several times daily. Try sucking on sour candies several times daily.

## 2022-03-12 ENCOUNTER — Other Ambulatory Visit (HOSPITAL_BASED_OUTPATIENT_CLINIC_OR_DEPARTMENT_OTHER): Payer: Self-pay

## 2022-03-18 ENCOUNTER — Other Ambulatory Visit: Payer: Self-pay | Admitting: Hematology & Oncology

## 2022-03-18 DIAGNOSIS — Z853 Personal history of malignant neoplasm of breast: Secondary | ICD-10-CM

## 2022-03-24 ENCOUNTER — Ambulatory Visit (INDEPENDENT_AMBULATORY_CARE_PROVIDER_SITE_OTHER): Payer: PPO | Admitting: *Deleted

## 2022-03-24 DIAGNOSIS — Z0000 Encounter for general adult medical examination without abnormal findings: Secondary | ICD-10-CM

## 2022-03-24 NOTE — Patient Instructions (Signed)
[name] , Thank you for taking time to come for your Medicare Wellness Visit. I appreciate your ongoing commitment to your health goals. Please review the following plan we discussed and let me know if I can assist you in the future.   These are the goals we discussed:  Goals      Patient Stated     Lose some weight & increase activity        This is a list of the screening recommended for you and due dates:  Health Maintenance  Topic Date Due   DTaP/Tdap/Td vaccine (1 - Tdap) Never done   Zoster (Shingles) Vaccine (1 of 2) Never done   COVID-19 Vaccine (3 - Pfizer risk series) 05/29/2019   Mammogram  04/16/2022   Eye exam for diabetics  06/12/2022   Hemoglobin A1C  06/29/2022   Yearly kidney function blood test for diabetes  12/30/2022   Yearly kidney health urinalysis for diabetes  12/30/2022   Complete foot exam   12/30/2022   Medicare Annual Wellness Visit  03/25/2023   DEXA scan (bone density measurement)  04/10/2023   Colon Cancer Screening  11/12/2026   Pneumonia Vaccine  Completed   Hepatitis C Screening: USPSTF Recommendation to screen - Ages 13-79 yo.  Completed   HPV Vaccine  Aged Out     Next appointment: Follow up in one year for your annual wellness visit.   Preventive Care 14 Years and Older, Female Preventive care refers to lifestyle choices and visits with your health care provider that can promote health and wellness. What does preventive care include? A yearly physical exam. This is also called an annual well check. Dental exams once or twice a year. Routine eye exams. Ask your health care provider how often you should have your eyes checked. Personal lifestyle choices, including: Daily care of your teeth and gums. Regular physical activity. Eating a healthy diet. Avoiding tobacco and drug use. Limiting alcohol use. Practicing safe sex. Taking low-dose aspirin every day. Taking vitamin and mineral supplements as recommended by your health  care provider. What happens during an annual well check? The services and screenings done by your health care provider during your annual well check will depend on your age, overall health, lifestyle risk factors, and family history of disease. Counseling  Your health care provider may ask you questions about your: Alcohol use. Tobacco use. Drug use. Emotional well-being. Home and relationship well-being. Sexual activity. Eating habits. History of falls. Memory and ability to understand (cognition). Work and work Statistician. Reproductive health. Screening  You may have the following tests or measurements: Height, weight, and BMI. Blood pressure. Lipid and cholesterol levels. These may be checked every 5 years, or more frequently if you are over 23 years old. Skin check. Lung cancer screening. You may have this screening every year starting at age 62 if you have a 30-pack-year history of smoking and currently smoke or have quit within the past 15 years. Fecal occult blood test (FOBT) of the stool. You may have this test every year starting at age 73. Flexible sigmoidoscopy or colonoscopy. You may have a sigmoidoscopy every 5 years or a colonoscopy every 10 years starting at age 38. Hepatitis C blood test. Hepatitis B blood test. Sexually transmitted disease (STD) testing. Diabetes screening. This is done by checking your blood sugar (glucose) after you have not eaten for a while (fasting). You may have this done every 1-3 years. Bone density scan. This is done to screen for  osteoporosis. You may have this done starting at age 81. Mammogram. This may be done every 1-2 years. Talk to your health care provider about how often you should have regular mammograms. Talk with your health care provider about your test results, treatment options, and if necessary, the need for more tests. Vaccines  Your health care provider may recommend certain vaccines, such as: Influenza vaccine. This is  recommended every year. Tetanus, diphtheria, and acellular pertussis (Tdap, Td) vaccine. You may need a Td booster every 10 years. Zoster vaccine. You may need this after age 49. Pneumococcal 13-valent conjugate (PCV13) vaccine. One dose is recommended after age 19. Pneumococcal polysaccharide (PPSV23) vaccine. One dose is recommended after age 23. Talk to your health care provider about which screenings and vaccines you need and how often you need them. This information is not intended to replace advice given to you by your health care provider. Make sure you discuss any questions you have with your health care provider. Document Released: 02/28/2015 Document Revised: 10/22/2015 Document Reviewed: 12/03/2014 Elsevier Interactive Patient Education  2017 [place] Prevention in the Home Falls can cause injuries. They can happen to people of all ages. There are many things you can do to make your home safe and to help prevent falls. What can I do on the outside of my home? Regularly fix the edges of walkways and driveways and fix any cracks. Remove anything that might make you trip as you walk through a door, such as a raised step or threshold. Trim any bushes or trees on the path to your home. Use bright outdoor lighting. Clear any walking paths of anything that might make someone trip, such as rocks or tools. Regularly check to see if handrails are loose or broken. Make sure that both sides of any steps have handrails. Any raised decks and porches should have guardrails on the edges. Have any leaves, snow, or ice cleared regularly. Use sand or salt on walking paths during winter. Clean up any spills in your garage right away. This includes oil or grease spills. What can I do in the bathroom? Use night lights. Install grab bars by the toilet and in the tub and shower. Do not use towel bars as grab bars. Use non-skid mats or decals in the tub or shower. If you need to sit down in  the shower, use a plastic, non-slip stool. Keep the floor dry. Clean up any water that spills on the floor as soon as it happens. Remove soap buildup in the tub or shower regularly. Attach bath mats securely with double-sided non-slip rug tape. Do not have throw rugs and other things on the floor that can make you trip. What can I do in the bedroom? Use night lights. Make sure that you have a light by your bed that is easy to reach. Do not use any sheets or blankets that are too big for your bed. They should not hang down onto the floor. Have a firm chair that has side arms. You can use this for support while you get dressed. Do not have throw rugs and other things on the floor that can make you trip. What can I do in the kitchen? Clean up any spills right away. Avoid walking on wet floors. Keep items that you use a lot in easy-to-reach places. If you need to reach something above you, use a strong step stool that has a grab bar. Keep electrical cords out of the way. Do not  use floor polish or wax that makes floors slippery. If you must use wax, use non-skid floor wax. Do not have throw rugs and other things on the floor that can make you trip. What can I do with my stairs? Do not leave any items on the stairs. Make sure that there are handrails on both sides of the stairs and use them. Fix handrails that are broken or loose. Make sure that handrails are as long as the stairways. Check any carpeting to make sure that it is firmly attached to the stairs. Fix any carpet that is loose or worn. Avoid having throw rugs at the top or bottom of the stairs. If you do have throw rugs, attach them to the floor with carpet tape. Make sure that you have a light switch at the top of the stairs and the bottom of the stairs. If you do not have them, ask someone to add them for you. What else can I do to help prevent falls? Wear shoes that: Do not have high heels. Have rubber bottoms. Are comfortable  and fit you well. Are closed at the toe. Do not wear sandals. If you use a stepladder: Make sure that it is fully opened. Do not climb a closed stepladder. Make sure that both sides of the stepladder are locked into place. Ask someone to hold it for you, if possible. Clearly mark and make sure that you can see: Any grab bars or handrails. First and last steps. Where the edge of each step is. Use tools that help you move around (mobility aids) if they are needed. These include: Canes. Walkers. Scooters. Crutches. Turn on the lights when you go into a dark area. Replace any light bulbs as soon as they burn out. Set up your furniture so you have a clear path. Avoid moving your furniture around. If any of your floors are uneven, fix them. If there are any pets around you, be aware of where they are. Review your medicines with your doctor. Some medicines can make you feel dizzy. This can increase your chance of falling. Ask your doctor what other things that you can do to help prevent falls. This information is not intended to replace advice given to you by your health care provider. Make sure you discuss any questions you have with your health care provider. Document Released: 11/28/2008 Document Revised: 07/10/2015 Document Reviewed: 03/08/2014 Elsevier Interactive Patient Education  2017 [employer].

## 2022-03-24 NOTE — Progress Notes (Signed)
Subjective:  Pt completed ADLs, Fall screen, & SDOH during e-check in on 03/23/22. Answers verified with pt.    Michelle [name] is a 67 y.o. female who presents for Medicare Annual (Subsequent) preventive examination.  I connected with  Michelle [name] on 03/24/22 by a audio enabled telemedicine application and verified that I am speaking with the correct person using two identifiers.  Patient Location: Home  Provider Location: Office/Clinic  I discussed the limitations of evaluation and management by telemedicine. The patient expressed understanding and agreed to proceed.   Review of Systems    Defer to PCP Cardiac Risk Factors include: advanced age (>73mn, >>49women);diabetes mellitus;dyslipidemia;hypertension     Objective:    There were no vitals filed for this visit. There is no height or weight on file to calculate BMI.     03/24/2022   11:02 AM 12/14/2021    9:50 AM 12/01/2021   11:57 AM 07/21/2021   10:08 AM 03/24/2021    9:18 AM 11/04/2020    9:46 AM 10/15/2020   10:51 AM  Advanced Directives  Does Patient Have a Medical Advance Directive? No No No No No No No  Would patient like information on creating a medical advance directive? No - Patient declined No - Patient declined No - Patient declined No - Patient declined No - Patient declined Yes (MAU/Ambulatory/Procedural Areas - Information given) No - Patient declined    Current Medications (verified) Outpatient Encounter Medications as of 03/24/2022  Medication Sig   ALBUTEROL IN 07/21/2021 1-2 puffs every 4-6 hours prn.   Alpha-Lipoic Acid 600 MG TABS Take 600 mg by mouth daily.   amLODipine (NORVASC) 5 MG tablet Take 1 tablet (5 mg total) by mouth daily.   Ascorbic Acid (VITAMIN C) 1000 MG tablet Take 1,000 mg by mouth 2 (two) times daily.   ASPIRIN 81 PO Take by mouth daily.   blood glucose meter kit and supplies KIT Use to check blood glucose once daily   budesonide-formoterol (SYMBICORT) 80-4.5 MCG/ACT  inhaler Inhale 2 puffs into the lungs in the morning and at bedtime.   Calcium-Magnesium (CAL-MAG PO) Take 1 tablet by mouth 3 (three) times daily.   Cholecalciferol (DIALYVITE VITAMIN D 5000) 125 MCG (5000 UT) capsule Take 5,000 Units by mouth daily.   CHROMIUM PO Take 2 tablets by mouth 2 (two) times daily.   CINNAMON PO Take 125 mg by mouth 3 (three) times daily.   co-enzyme Q-10 50 MG capsule Take 100 mg by mouth daily.   Digestive Enzyme CAPS Take 1 capsule by mouth 3 (three) times daily with meals.   fluticasone (FLONASE) 50 MCG/ACT nasal spray Place 1 spray into both nostrils 2 (two) times daily.   GARLIC PO Take 1 tablet by mouth 3 (three) times daily.   glucose blood test strip Use to check blood glucose once daily   hydrochlorothiazide (HYDRODIURIL) 25 MG tablet Take 1 tablet (25 mg total) by mouth daily.   Lancets (ONETOUCH ULTRASOFT) lancets Use to check blood glucose once daily   loratadine (CLARITIN) 10 MG tablet Take 10 mg by mouth at bedtime.   Melatonin 10 MG CAPS Take 20 mg by mouth at bedtime.   Menaquinone-7 (VITAMIN K2 PO) Take 1 tablet by mouth daily.   metFORMIN (GLUCOPHAGE-XR) 500 MG 24 hr tablet Take 1 tablet (500 mg total) by mouth 2 (two) times daily.   Methylcobalamin 50000 MCG SOLR Inject 5,000 mcg as directed 2 (two) times a week. Tuesdays and Saturdays  MULTIPLE VITAMIN PO Take 2 capsules by mouth in the morning, at noon, and at bedtime.   NALTREXONE HCL PO Take 3 mg by mouth at bedtime. Natrexone er 1.5 mg capsules x 2   Omega-3 Fatty Acids (THE VERY FINEST FISH OIL) LIQD Take 15 mLs by mouth daily.   OVER THE COUNTER MEDICATION 10 mg at bedtime. CBD gummy.   POTASSIUM IODIDE PO Take 225 mcg by mouth 3 (three) times daily.   Probiotic Product (PROBIOTIC PO) Take 1 capsule by mouth at bedtime.   QUERCETIN PO Take 1 tablet by mouth 3 (three) times daily.   simvastatin (ZOCOR) 20 MG tablet Take 1 tablet (20 mg total) by mouth daily.   thyroid (NP THYROID) 90 MG  tablet Take 1/2 tablet by mouth daily   TURMERIC PO Take 1,500 mg by mouth 2 (two) times daily.   VITAMIN A PO Take 20,000 Units by mouth daily.   VITAMIN E PO Take 260 mg by mouth daily.   Zinc 50 MG TABS Take 50 mg by mouth daily.   [DISCONTINUED] amoxicillin-clavulanate (AUGMENTIN) 875-125 MG tablet Take 1 tablet by mouth every 12 (twelve) hours with food.   [DISCONTINUED] fluconazole (DIFLUCAN) 150 MG tablet Take one tablet by mouth once for one dose.  May repeat in 72 hours.   [DISCONTINUED] nystatin (MYCOSTATIN) 100000 UNIT/ML suspension Take 5 mLs (500,000 Units total) by mouth 4 (four) times daily.   [DISCONTINUED] prednisoLONE acetate (PRED FORTE) 1 % ophthalmic suspension Place 1 drop into the affected eye 4 (four) times daily for 5 days.   No facility-administered encounter medications on file as of 03/24/2022.    Allergies (verified) Influenza vaccine live, Shrimp [shellfish allergy], Tdap [tetanus-diphth-acell pertussis], Demerol [meperidine], Peanut-containing drug products, Chlorhexidine gluconate [chlorhexidine], and Latex   History: Past Medical History:  Diagnosis Date   Allergy    Asthma    Bronchitis    Cancer ([facility])    Cataract 03/25/2020   Chronic kidney disease    COPD (chronic obstructive pulmonary disease) (HCC)    mild   Diabetes mellitus without complication ([facility])    Epstein Barr infection    GERD (gastroesophageal reflux disease) 2014   Goals of care, counseling/discussion 06/30/2020   History of hiatal hernia    History of kidney stones    Hyperlipidemia    Hypertension    stress induced/ work makes it worse.   Hypothyroidism    Neuromuscular disorder ([facility])    Neuropathy   Obesity    Pneumonia    Thyroid disease    Past Surgical History:  Procedure Laterality Date   BREAST BIOPSY     BREAST EXCISIONAL BIOPSY Left 2022   BREAST LUMPECTOMY WITH RADIOACTIVE SEED AND SENTINEL LYMPH NODE BIOPSY Left 06/11/2020   Procedure: LEFT BREAST LUMPECTOMY  WITH RADIOACTIVE SEED AND LEFT AXILLARY SENTINEL LYMPH NODE BIOPSY;  Surgeon: Rolm Bookbinder, MD;  Location: [facility];  Service: General;  Laterality: Left;   CATARACT EXTRACTION Right 10/02/2021   CATARACT EXTRACTION Left 07/31/2021   TONSILLECTOMY AND ADENOIDECTOMY  childhood   TUBAL LIGATION     VAGINAL HYSTERECTOMY  2011   Family History  Problem Relation Age of Onset   Heart disease Mother    Hyperlipidemia Mother    Stroke Mother    Hypertension Mother    Arthritis Mother    Hearing loss Mother    Varicose Veins Mother    Heart disease Father    Hyperlipidemia Father    Colon polyps Brother  Coronary artery disease Brother    Heart disease Brother    Prostate cancer Brother    Cancer Brother    Hyperlipidemia Brother    Hypertension Brother    Heart disease Brother    Hyperlipidemia Brother    Colon cancer Neg Hx    Stomach cancer Neg Hx    Rectal cancer Neg Hx    Esophageal cancer Neg Hx    Social History   Socioeconomic History   Marital status: Married    Spouse name: Not on file   Number of children: Not on file   Years of education: Not on file   Highest education level: Not on file  Occupational History   Occupation: RN  Tobacco Use   Smoking status: Never   Smokeless tobacco: Never  Vaping Use   Vaping Use: Never used  Substance and Sexual Activity   Alcohol use: No   Drug use: No   Sexual activity: Yes  Other Topics Concern   Not on file  Social History Narrative   Exercise- Walks about an hour a day   Right handed   Lives husband one story   Social Determinants of Health   Financial Resource Strain: Low Risk  (03/23/2022)   Overall Financial Resource Strain (CARDIA)    Difficulty of Paying Living Expenses: Not very hard  Food Insecurity: No Food Insecurity (03/23/2022)   Hunger Vital Sign    Worried About Running Out of Food in the Last Year: Never true    Ran Out of Food in the Last Year: Never true  Transportation Needs: No  Transportation Needs (03/23/2022)   PRAPARE - Hydrologist (Medical): No    Lack of Transportation (Non-Medical): No  Physical Activity: Sufficiently Active (03/23/2022)   Exercise Vital Sign    Days of Exercise per Week: 5 days    Minutes of Exercise per Session: 60 min  Stress: Stress Concern Present (03/23/2022)   [place]    Feeling of Stress : To some extent  Social Connections: Socially Integrated (03/23/2022)   Social Connection and Isolation Panel [NHANES]    Frequency of Communication with Friends and Family: More than three times a week    Frequency of Social Gatherings with Friends and Family: More than three times a week    Attends Religious Services: More than 4 times per year    Active Member of [employer] or Organizations: Yes    Attends Music therapist: More than 4 times per year    Marital Status: Married    Tobacco Counseling Counseling given: Not Answered   Clinical Intake:  Pre-visit preparation completed: Yes  Pain : No/denies pain  How often do you need to have someone help you when you read instructions, pamphlets, or other written materials from your doctor or pharmacy?: 1 - Never  Diabetic? Nutrition Risk Assessment:  Has the patient had any N/V/D within the last 2 months?  No  Does the patient have any non-healing wounds?  No  Has the patient had any unintentional weight loss or weight gain?  No   Diabetes:  Is the patient diabetic?  Yes  If diabetic, was a CBG obtained today?  No  Did the patient bring in their glucometer from home?  No  How often do you monitor your CBG's? "Rarely".   Financial Strains and Diabetes Management:  Are you having any financial strains with the device, your supplies  or your medication? No .  Does the patient want to be seen by Chronic Care Management for management of their diabetes?  No  Would the patient like to  be referred to a Nutritionist or for Diabetic Management?  No   Diabetic Exams:  Diabetic Eye Exam: Completed 06/11/21 Diabetic Foot Exam: Completed 12/29/21   Interpreter Needed?: No  Information entered by :: Beatris Ship, [place]   Activities of Daily Living    03/23/2022    2:51 PM  In your present state of health, do you have any difficulty performing the following activities:  Hearing? 0  Vision? 0  Difficulty concentrating or making decisions? 0  Walking or climbing stairs? 0  Dressing or bathing? 0  Doing errands, shopping? 0  Preparing Food and eating ? N  Using the Toilet? N  In the past six months, have you accidently leaked urine? Y  Do you have problems with loss of bowel control? N  Managing your Medications? N  Managing your Finances? N  Housekeeping or managing your Housekeeping? N    Patient Care Team: Carollee Herter, Alferd Apa, DO as PCP - General (Family Medicine) Marica Otter, [place] (Optometry) Kerney Elbe, MD as Referring Physician (Family Medicine) Marylynn Pearson, MD as Consulting Physician (Obstetrics and Gynecology) Marin Olp Rudell Cobb, MD as Oncology Nurse Navigator (Oncology) Cordelia Poche, RN as Oncology Nurse Navigator  Indicate any recent Medical Services you may have received from other than [facility] providers in the past year (date may be approximate).     Assessment:   This is a routine wellness examination for Katerin.  Hearing/Vision screen No results found.  Dietary issues and exercise activities discussed: Current Exercise Habits: Home exercise routine, Type of exercise: walking, Time (Minutes): 60, Frequency (Times/Week): 5, Weekly Exercise (Minutes/Week): 300, Intensity: Mild, Exercise limited by: None identified   Goals Addressed   None    Depression Screen    03/24/2022   11:03 AM 06/16/2021    9:31 AM 11/04/2020    9:49 AM 11/06/2019   10:41 AM 07/12/2017    3:32 PM 10/19/2016   11:30 AM  PHQ 2/9 Scores  PHQ - 2 Score 0 0 0 0 0 0     Fall Risk    03/23/2022    2:51 PM 06/16/2021    9:31 AM 11/04/2020    9:48 AM 09/27/2019    9:27 AM 09/13/2018    2:13 PM  Fall Risk   Falls in the past year? 0 0 0 0 0  Comment     Emmi Telephone Survey: data to providers prior to load  Number falls in past yr: 0 0 0 0   Injury with Fall? 0 0 0 0   Risk for fall due to : No Fall Risks      Follow up Falls evaluation completed Falls evaluation completed Falls prevention discussed      Ruth:  Any stairs in or around the home? Yes  If so, are there any without handrails? No  Home free of loose throw rugs in walkways, pet beds, electrical cords, etc? Yes  Adequate lighting in your home to reduce risk of falls? Yes   ASSISTIVE DEVICES UTILIZED TO PREVENT FALLS:  Life alert? No  Use of a cane, walker or w/c? No  Grab bars in the bathroom? No  Shower chair or bench in shower?  Built-in seat Elevated toilet seat or a handicapped toilet? No   TIMED UP  AND GO:  Was the test performed?  No, audio visit .    Cognitive Function:        03/24/2022   11:10 AM  6CIT Screen  What Year? 0 points  What month? 0 points  What time? 0 points  Count back from 20 0 points  Months in reverse 0 points  Repeat phrase 0 points  Total Score 0 points    Immunizations Immunization History  Administered Date(s) Administered   PFIZER(Purple Top)SARS-COV-2 Vaccination 04/07/2019, 05/01/2019   PNEUMOCOCCAL CONJUGATE-20 06/16/2021    TDAP status: Due, Education has been provided regarding the importance of this vaccine. Advised may receive this vaccine at local pharmacy or Health Dept. Aware to provide a copy of the vaccination record if obtained from local pharmacy or Health Dept. Verbalized acceptance and understanding.  Flu vaccine status: not indicated due to allergy  Pneumococcal vaccine status: Up to date  Covid-19 vaccine status: Information provided on how to obtain vaccines.   Qualifies  for Shingles Vaccine? Yes   Zostavax completed No   Shingrix Completed?: No.    Education has been provided regarding the importance of this vaccine. Patient has been advised to call insurance company to determine out of pocket expense if they have not yet received this vaccine. Advised may also receive vaccine at local pharmacy or Health Dept. Verbalized acceptance and understanding.  Screening Tests Health Maintenance  Topic Date Due   DTaP/Tdap/Td (1 - Tdap) Never done   Zoster Vaccines- Shingrix (1 of 2) Never done   COVID-19 Vaccine (3 - Pfizer risk series) 05/29/2019   Medicare Annual Wellness (AWV)  11/04/2021   MAMMOGRAM  04/16/2022   OPHTHALMOLOGY EXAM  06/12/2022   HEMOGLOBIN A1C  06/29/2022   Diabetic kidney evaluation - eGFR measurement  12/30/2022   Diabetic kidney evaluation - Urine ACR  12/30/2022   FOOT EXAM  12/30/2022   DEXA SCAN  04/10/2023   COLONOSCOPY (Pts 45-65yr Insurance coverage will need to be confirmed)  11/12/2026   Pneumonia Vaccine 67 Years old  Completed   Hepatitis C Screening  Completed   HPV VACCINES  Aged Out    Health Maintenance  Health Maintenance Due  Topic Date Due   DTaP/Tdap/Td (1 - Tdap) Never done   Zoster Vaccines- Shingrix (1 of 2) Never done   COVID-19 Vaccine (3 - Pfizer risk series) 05/29/2019   Medicare Annual Wellness (AWV)  11/04/2021    Colorectal cancer screening: Type of screening: Colonoscopy. Completed 11/11/16. Repeat every 10 years  Mammogram status: Completed 04/15/21. Repeat every year  Bone Density status: Completed 04/09/21. Results reflect: Bone density results: NORMAL. Repeat every 2 years.  Lung Cancer Screening: (Low Dose CT Chest recommended if Age 69-80years, 30 pack-year currently smoking OR have quit w/in 15years.) does not qualify.   Additional Screening:  Hepatitis C Screening: does qualify; Completed 01/29/15  Vision Screening: Recommended annual ophthalmology exams for early detection of glaucoma  and other disorders of the eye. Is the patient up to date with their annual eye exam?  Yes  Who is the provider or what is the name of the office in which the patient attends annual eye exams? [facility] is not established with a provider, would they like to be referred to a provider to establish care? No .   Dental Screening: Recommended annual dental exams for proper oral hygiene  Community Resource Referral / Chronic Care Management: CRR required this visit?  No   CCM required this  visit?  No      Plan:     I have personally reviewed and noted the following in the patient's chart:   Medical and social history Use of alcohol, tobacco or illicit drugs  Current medications and supplements including opioid prescriptions. Patient is not currently taking opioid prescriptions. Functional ability and status Nutritional status Physical activity Advanced directives List of other physicians Hospitalizations, surgeries, and ER visits in previous 12 months Vitals Screenings to include cognitive, depression, and falls Referrals and appointments  In addition, I have reviewed and discussed with patient certain preventive protocols, quality metrics, and best practice recommendations. A written personalized care plan for preventive services as well as general preventive health recommendations were provided to patient.   Due to this being a telephonic visit, the after visit summary with patients personalized plan was offered to patient via mail or my-chart.  Patient would like to access on my-chart.   Beatris Ship, [place]   03/24/2022   Nurse Notes: None

## 2022-04-12 ENCOUNTER — Other Ambulatory Visit: Payer: Self-pay | Admitting: Family Medicine

## 2022-04-12 ENCOUNTER — Other Ambulatory Visit (HOSPITAL_BASED_OUTPATIENT_CLINIC_OR_DEPARTMENT_OTHER): Payer: Self-pay

## 2022-04-12 ENCOUNTER — Encounter: Payer: Self-pay | Admitting: Family Medicine

## 2022-04-12 ENCOUNTER — Telehealth: Payer: Self-pay

## 2022-04-12 DIAGNOSIS — J40 Bronchitis, not specified as acute or chronic: Secondary | ICD-10-CM

## 2022-04-12 MED ORDER — FLUTICASONE-SALMETEROL 100-50 MCG/ACT IN AEPB
1.0000 | INHALATION_SPRAY | Freq: Two times a day (BID) | RESPIRATORY_TRACT | 3 refills | Status: DC
  Filled 2022-04-12: qty 60, 30d supply, fill #0

## 2022-04-12 NOTE — Telephone Encounter (Signed)
PA initiated via Covermymeds; KEY: [id number]  Prior Authorization duplicate/in progress

## 2022-04-14 ENCOUNTER — Other Ambulatory Visit (HOSPITAL_BASED_OUTPATIENT_CLINIC_OR_DEPARTMENT_OTHER): Payer: Self-pay

## 2022-04-14 ENCOUNTER — Ambulatory Visit (INDEPENDENT_AMBULATORY_CARE_PROVIDER_SITE_OTHER): Payer: PPO | Admitting: Family Medicine

## 2022-04-14 ENCOUNTER — Encounter: Payer: Self-pay | Admitting: Family Medicine

## 2022-04-14 VITALS — BP 140/70 | HR 97 | Temp 98.7°F | Resp 18 | Ht 63.0 in | Wt 175.6 lb

## 2022-04-14 DIAGNOSIS — J208 Acute bronchitis due to other specified organisms: Secondary | ICD-10-CM

## 2022-04-14 MED ORDER — ALBUTEROL SULFATE HFA 108 (90 BASE) MCG/ACT IN AERS
2.0000 | INHALATION_SPRAY | Freq: Four times a day (QID) | RESPIRATORY_TRACT | 0 refills | Status: DC | PRN
  Filled 2022-04-14: qty 6.7, 25d supply, fill #0

## 2022-04-14 MED ORDER — PREDNISONE 20 MG PO TABS
40.0000 mg | ORAL_TABLET | Freq: Every day | ORAL | 0 refills | Status: AC
  Filled 2022-04-14: qty 10, 5d supply, fill #0

## 2022-04-14 MED ORDER — BENZONATATE 200 MG PO CAPS
200.0000 mg | ORAL_CAPSULE | Freq: Two times a day (BID) | ORAL | 0 refills | Status: DC | PRN
  Filled 2022-04-14: qty 20, 10d supply, fill #0

## 2022-04-14 NOTE — Patient Instructions (Signed)
Oral prednisone - please monitor your blood pressure occasionally at home.  Adding Tessalon Continue supportive measures including rest, hydration, humidifier use, steam showers, warm compresses to sinuses, warm liquids with lemon and honey, and over-the-counter cough, cold, and analgesics as needed.   Please contact office for follow-up if symptoms do not improve or worsen. Seek emergency care if symptoms become severe.

## 2022-04-14 NOTE — Progress Notes (Signed)
Acute Office Visit  Subjective:     Patient ID: Michelle [name], female    DOB: [birth year], 67 y.o.   MRN: [id number]  Chief Complaint  Patient presents with   Cough    Pt states sxs started over the weekend. Pt states having cough, SOB and had to use emergency inhale. Pt states using OTC Muncinex     Patient is in today for cough.   Patient reports she started with cough over the weekend, 4-5 days ago. She recently traveled back from visiting her mother in [place]. Symptoms have included body aches, fatigue, dyspnea, frequent cough with clear that keeps her up at night, wheezing, chills, low grade fevers. She has been taking Mucinex DM with minimal improvement. She denies chest pain, palpitations, hemoptysis, nausea, vomiting, diarrhea.   Reports she was recently on Augmentin in January for parotitis but has not had any recent steroids.      All review of systems negative except what is listed in the HPI       Objective:    BP (!) 140/70   Pulse 97   Temp 98.7 F (37.1 C) (Oral)   Resp 18   Ht '5\' 3"'$  (1.6 m)   Wt 175 lb 9.6 oz (79.7 kg)   SpO2 97%   BMI 31.11 kg/m    Physical Exam Vitals reviewed.  Constitutional:      Appearance: Normal appearance.  HENT:     Right Ear: Tympanic membrane normal.     Left Ear: Tympanic membrane normal.  Cardiovascular:     Rate and Rhythm: Normal rate and regular rhythm.     Pulses: Normal pulses.     Heart sounds: Normal heart sounds.  Pulmonary:     Effort: Pulmonary effort is normal.     Breath sounds: Normal breath sounds. No wheezing, rhonchi or rales.  Musculoskeletal:     Cervical back: Normal range of motion and neck supple.  Lymphadenopathy:     Cervical: No cervical adenopathy.  Skin:    General: Skin is warm and dry.  Neurological:     Mental Status: She is alert and oriented to person, place, and time.  Psychiatric:        Mood and Affect: Mood normal.        Behavior: Behavior normal.         Thought Content: Thought content normal.        Judgment: Judgment normal.      No results found for any visits on 04/14/22.      Assessment & Plan:   Problem List Items Addressed This Visit   None Visit Diagnoses     Viral bronchitis    -  Primary Oral prednisone - please monitor your blood pressure occasionally at home.  Adding Tessalon Continue supportive measures including rest, hydration, humidifier use, steam showers, warm compresses to sinuses, warm liquids with lemon and honey, and over-the-counter cough, cold, and analgesics as needed.  Patient aware of signs/symptoms requiring further/urgent evaluation.      Relevant Medications   benzonatate (TESSALON) 200 MG capsule   albuterol (VENTOLIN HFA) 108 (90 Base) MCG/ACT inhaler   predniSONE (DELTASONE) 20 MG tablet       Meds ordered this encounter  Medications   benzonatate (TESSALON) 200 MG capsule    Sig: Take 1 capsule (200 mg total) by mouth 2 (two) times daily as needed for cough.    Dispense:  20 capsule    Refill:  0  Order Specific Question:   Supervising Provider    Answer:   Penni Homans A [[id number]]   albuterol (VENTOLIN HFA) 108 (90 Base) MCG/ACT inhaler    Sig: Inhale 2 puffs into the lungs every 6 (six) hours as needed for wheezing or shortness of breath.    Dispense:  6.7 g    Refill:  0    Order Specific Question:   Supervising Provider    Answer:   Penni Homans A [[id number]]   predniSONE (DELTASONE) 20 MG tablet    Sig: Take 2 tablets (40 mg total) by mouth daily with breakfast for 5 days.    Dispense:  10 tablet    Refill:  0    Order Specific Question:   Supervising Provider    Answer:   Penni Homans A [[id number]]    Return if symptoms worsen or fail to improve.  Terrilyn Saver, NP

## 2022-04-16 ENCOUNTER — Other Ambulatory Visit (HOSPITAL_BASED_OUTPATIENT_CLINIC_OR_DEPARTMENT_OTHER): Payer: Self-pay

## 2022-04-16 ENCOUNTER — Encounter: Payer: Self-pay | Admitting: Family Medicine

## 2022-04-16 DIAGNOSIS — J40 Bronchitis, not specified as acute or chronic: Secondary | ICD-10-CM

## 2022-04-16 MED ORDER — DOXYCYCLINE HYCLATE 100 MG PO TABS
100.0000 mg | ORAL_TABLET | Freq: Two times a day (BID) | ORAL | 0 refills | Status: AC
  Filled 2022-04-16: qty 14, 7d supply, fill #0

## 2022-04-22 ENCOUNTER — Encounter: Payer: Self-pay | Admitting: Family Medicine

## 2022-04-22 ENCOUNTER — Ambulatory Visit (HOSPITAL_BASED_OUTPATIENT_CLINIC_OR_DEPARTMENT_OTHER)
Admission: RE | Admit: 2022-04-22 | Discharge: 2022-04-22 | Disposition: A | Discharge status: Home / Self Care | Payer: PPO | Source: Ambulatory Visit | Attending: Family Medicine | Admitting: Family Medicine

## 2022-04-22 ENCOUNTER — Other Ambulatory Visit: Payer: Self-pay

## 2022-04-22 ENCOUNTER — Other Ambulatory Visit (HOSPITAL_BASED_OUTPATIENT_CLINIC_OR_DEPARTMENT_OTHER): Payer: Self-pay

## 2022-04-22 ENCOUNTER — Other Ambulatory Visit: Payer: Self-pay | Admitting: Family Medicine

## 2022-04-22 ENCOUNTER — Ambulatory Visit (INDEPENDENT_AMBULATORY_CARE_PROVIDER_SITE_OTHER): Payer: PPO | Admitting: Family Medicine

## 2022-04-22 VITALS — BP 138/88 | HR 92 | Temp 97.8°F | Resp 18 | Ht 63.0 in | Wt 175.6 lb

## 2022-04-22 DIAGNOSIS — J40 Bronchitis, not specified as acute or chronic: Secondary | ICD-10-CM | POA: Diagnosis not present

## 2022-04-22 DIAGNOSIS — R053 Chronic cough: Secondary | ICD-10-CM

## 2022-04-22 DIAGNOSIS — E1165 Type 2 diabetes mellitus with hyperglycemia: Secondary | ICD-10-CM

## 2022-04-22 DIAGNOSIS — J9801 Acute bronchospasm: Secondary | ICD-10-CM | POA: Diagnosis not present

## 2022-04-22 DIAGNOSIS — Z923 Personal history of irradiation: Secondary | ICD-10-CM

## 2022-04-22 DIAGNOSIS — R059 Cough, unspecified: Secondary | ICD-10-CM | POA: Diagnosis not present

## 2022-04-22 DIAGNOSIS — I10 Essential (primary) hypertension: Secondary | ICD-10-CM

## 2022-04-22 DIAGNOSIS — E1169 Type 2 diabetes mellitus with other specified complication: Secondary | ICD-10-CM

## 2022-04-22 DIAGNOSIS — E785 Hyperlipidemia, unspecified: Secondary | ICD-10-CM

## 2022-04-22 LAB — COMPREHENSIVE METABOLIC PANEL
ALT: 18 U/L (ref 0–35)
AST: 16 U/L (ref 0–37)
Albumin: 4.2 g/dL (ref 3.5–5.2)
Alkaline Phosphatase: 42 U/L (ref 39–117)
BUN: 24 mg/dL — ABNORMAL HIGH (ref 6–23)
CO2: 31 mEq/L (ref 19–32)
Calcium: 10.4 mg/dL (ref 8.4–10.5)
Chloride: 95 mEq/L — ABNORMAL LOW (ref 96–112)
Creatinine, Ser: 0.82 mg/dL (ref 0.40–1.20)
GFR: 73.14 mL/min (ref >60.00)
Glucose, Bld: 97 mg/dL (ref 70–99)
Potassium: 4.8 mEq/L (ref 3.5–5.1)
Sodium: 134 mEq/L — ABNORMAL LOW (ref 135–145)
Total Bilirubin: 0.8 mg/dL (ref 0.2–1.2)
Total Protein: 6.9 g/dL (ref 6.0–8.3)

## 2022-04-22 MED ORDER — SIMVASTATIN 20 MG PO TABS
20.0000 mg | ORAL_TABLET | Freq: Every day | ORAL | 1 refills | Status: DC
  Filled 2022-04-22 – 2022-06-07 (×2): qty 90, 90d supply, fill #0
  Filled 2022-09-05: qty 90, 90d supply, fill #1

## 2022-04-22 MED ORDER — AMLODIPINE BESYLATE 5 MG PO TABS
5.0000 mg | ORAL_TABLET | Freq: Every day | ORAL | 1 refills | Status: DC
  Filled 2022-04-22 – 2022-06-23 (×2): qty 90, 90d supply, fill #0
  Filled 2022-09-13: qty 90, 90d supply, fill #1

## 2022-04-22 MED ORDER — TRELEGY ELLIPTA 100-62.5-25 MCG/ACT IN AEPB
1.0000 | INHALATION_SPRAY | Freq: Every day | RESPIRATORY_TRACT | 11 refills | Status: DC
  Filled 2022-04-22: qty 30, 15d supply, fill #0
  Filled 2022-05-13 – 2022-05-17 (×3): qty 180, 90d supply, fill #0
  Filled 2022-07-27: qty 180, 90d supply, fill #1
  Filled 2022-11-02: qty 180, 90d supply, fill #2
  Filled 2022-11-02: qty 60, 30d supply, fill #2

## 2022-04-22 MED ORDER — HYDROCHLOROTHIAZIDE 25 MG PO TABS
25.0000 mg | ORAL_TABLET | Freq: Every day | ORAL | 1 refills | Status: DC
  Filled 2022-04-22 – 2022-06-23 (×2): qty 90, 90d supply, fill #0
  Filled 2022-09-13: qty 90, 90d supply, fill #1

## 2022-04-22 MED ORDER — TRELEGY ELLIPTA 100-62.5-25 MCG/ACT IN AEPB
1.0000 | INHALATION_SPRAY | Freq: Every day | RESPIRATORY_TRACT | 11 refills | Status: DC
  Filled 2022-04-22: qty 60, 30d supply, fill #0

## 2022-04-22 MED ORDER — LEVOFLOXACIN 500 MG PO TABS
500.0000 mg | ORAL_TABLET | Freq: Every day | ORAL | 0 refills | Status: AC
  Filled 2022-04-22: qty 7, 7d supply, fill #0

## 2022-04-22 MED ORDER — METFORMIN HCL ER 500 MG PO TB24
500.0000 mg | ORAL_TABLET | Freq: Two times a day (BID) | ORAL | 1 refills | Status: DC
  Filled 2022-04-22: qty 180, 90d supply, fill #0
  Filled 2022-07-23: qty 180, 90d supply, fill #1

## 2022-04-22 NOTE — Progress Notes (Signed)
Subjective:   By signing my name below, I, Jamey Reas, attest that this documentation has been prepared under the direction and in the presence of [name], DO. 04/22/2022   Patient ID: Michelle [name], female    DOB: [birth year], 67 y.o.   MRN: [id number]  Chief Complaint  Patient presents with   Bronchitis   Follow-up    HPI Patient is in today for a follow-up appointment.   Swollen carotid gland Her carotid gland was swollen at the beginning of January 2024. She took Augmentin to manage it.   Covid-19 She had Covid-19 during the middle of January 2024. She contracted yeast and she took diacam to manage it.  Cough  She complains of having difficulty breathing and a cough. She took prednisone to manage it. She is currently taking a course of 100 mg doxycycline daily PO and is on her last day of the antibiotics. She is currently still coughing up a yellow mucus.  Asthma  She has been experiencing more wheezing flare-ups as a result of her cough. She uses albuterol as needed to manage it.   Past Medical History:  Diagnosis Date   Allergy    Asthma    Bronchitis    Cancer ([place])    Cataract 03/25/2020   Chronic kidney disease    COPD (chronic obstructive pulmonary disease) (HCC)    mild   Diabetes mellitus without complication ([place])    Epstein Barr infection    GERD (gastroesophageal reflux disease) 2014   Goals of care, counseling/discussion 06/30/2020   History of hiatal hernia    History of kidney stones    Hyperlipidemia    Hypertension    stress induced/ work makes it worse.   Hypothyroidism    Neuromuscular disorder ([place])    Neuropathy   Obesity    Pneumonia    Thyroid disease     Past Surgical History:  Procedure Laterality Date   BREAST BIOPSY     BREAST EXCISIONAL BIOPSY Left 2022   BREAST LUMPECTOMY WITH RADIOACTIVE SEED AND SENTINEL LYMPH NODE BIOPSY Left 06/11/2020   Procedure: LEFT BREAST LUMPECTOMY WITH RADIOACTIVE SEED AND LEFT  AXILLARY SENTINEL LYMPH NODE BIOPSY;  Surgeon: Rolm Bookbinder, MD;  Location: [place];  Service: General;  Laterality: Left;   CATARACT EXTRACTION Right 10/02/2021   CATARACT EXTRACTION Left 07/31/2021   TONSILLECTOMY AND ADENOIDECTOMY  childhood   TUBAL LIGATION     VAGINAL HYSTERECTOMY  2011    Family History  Problem Relation Age of Onset   Heart disease Mother    Hyperlipidemia Mother    Stroke Mother    Hypertension Mother    Arthritis Mother    Hearing loss Mother    Varicose Veins Mother    Heart disease Father    Hyperlipidemia Father    Colon polyps Brother    Coronary artery disease Brother    Heart disease Brother    Prostate cancer Brother    Cancer Brother    Hyperlipidemia Brother    Hypertension Brother    Heart disease Brother    Hyperlipidemia Brother    Colon cancer Neg Hx    Stomach cancer Neg Hx    Rectal cancer Neg Hx    Esophageal cancer Neg Hx     Social History   Socioeconomic History   Marital status: Married    Spouse name: Not on file   Number of children: Not on file   Years of education: Not  on file   Highest education level: Not on file  Occupational History   Occupation: RN  Tobacco Use   Smoking status: Never   Smokeless tobacco: Never  Vaping Use   Vaping Use: Never used  Substance and Sexual Activity   Alcohol use: No   Drug use: No   Sexual activity: Yes  Other Topics Concern   Not on file  Social History Narrative   Exercise- Walks about an hour a day   Right handed   Lives husband one story   Social Determinants of Health   Financial Resource Strain: Low Risk  (03/23/2022)   Overall Financial Resource Strain (CARDIA)    Difficulty of Paying Living Expenses: Not very hard  Food Insecurity: No Food Insecurity (03/23/2022)   Hunger Vital Sign    Worried About Running Out of Food in the Last Year: Never true    Ran Out of Food in the Last Year: Never true  Transportation Needs: No Transportation Needs (03/23/2022)    PRAPARE - Hydrologist (Medical): No    Lack of Transportation (Non-Medical): No  Physical Activity: Sufficiently Active (03/23/2022)   Exercise Vital Sign    Days of Exercise per Week: 5 days    Minutes of Exercise per Session: 60 min  Stress: Stress Concern Present (03/23/2022)   [place]    Feeling of Stress : To some extent  Social Connections: Socially Integrated (03/23/2022)   Social Connection and Isolation Panel [NHANES]    Frequency of Communication with Friends and Family: More than three times a week    Frequency of Social Gatherings with Friends and Family: More than three times a week    Attends Religious Services: More than 4 times per year    Active Member of [employer] or Organizations: Yes    Attends Music therapist: More than 4 times per year    Marital Status: Married  Human resources officer Violence: Not At Risk (03/24/2022)   Humiliation, Afraid, Rape, and Kick questionnaire    Fear of Current or Ex-Partner: No    Emotionally Abused: No    Physically Abused: No    Sexually Abused: No    Outpatient Medications Prior to Visit  Medication Sig Dispense Refill   albuterol (VENTOLIN HFA) 108 (90 Base) MCG/ACT inhaler Inhale 2 puffs into the lungs every 6 (six) hours as needed for wheezing or shortness of breath. 6.7 g 0   Alpha-Lipoic Acid 600 MG TABS Take 600 mg by mouth daily.     Ascorbic Acid (VITAMIN C) 1000 MG tablet Take 1,000 mg by mouth 2 (two) times daily.     ASPIRIN 81 PO Take by mouth daily.     blood glucose meter kit and supplies KIT Use to check blood glucose once daily 1 each 0   Calcium-Magnesium (CAL-MAG PO) Take 1 tablet by mouth 3 (three) times daily.     Cholecalciferol (DIALYVITE VITAMIN D 5000) 125 MCG (5000 UT) capsule Take 5,000 Units by mouth daily.     CHROMIUM PO Take 2 tablets by mouth 2 (two) times daily.     CINNAMON PO Take 125 mg by mouth 3  (three) times daily.     co-enzyme Q-10 50 MG capsule Take 100 mg by mouth daily.     Digestive Enzyme CAPS Take 1 capsule by mouth 3 (three) times daily with meals.     fluticasone (FLONASE) 50 MCG/ACT nasal spray  Place 1 spray into both nostrils 2 (two) times daily.     GARLIC PO Take 1 tablet by mouth 3 (three) times daily.     glucose blood test strip Use to check blood glucose once daily 100 each 0   Lancets (ONETOUCH ULTRASOFT) lancets Use to check blood glucose once daily 100 each 0   loratadine (CLARITIN) 10 MG tablet Take 10 mg by mouth at bedtime.     Melatonin 10 MG CAPS Take 20 mg by mouth at bedtime.     Menaquinone-7 (VITAMIN K2 PO) Take 1 tablet by mouth daily.     Methylcobalamin 50000 MCG SOLR Inject 5,000 mcg as directed 2 (two) times a week. Tuesdays and Saturdays     MULTIPLE VITAMIN PO Take 2 capsules by mouth in the morning, at noon, and at bedtime.     NALTREXONE HCL PO Take 3 mg by mouth at bedtime. Natrexone er 1.5 mg capsules x 2     Omega-3 Fatty Acids (THE VERY FINEST FISH OIL) LIQD Take 15 mLs by mouth daily.     OVER THE COUNTER MEDICATION 10 mg at bedtime. CBD gummy.     POTASSIUM IODIDE PO Take 225 mcg by mouth 3 (three) times daily.     Probiotic Product (PROBIOTIC PO) Take 1 capsule by mouth at bedtime.     QUERCETIN PO Take 1 tablet by mouth 3 (three) times daily.     thyroid (NP THYROID) 90 MG tablet Take 1/2 tablet by mouth daily 100 tablet 3   TURMERIC PO Take 1,500 mg by mouth 2 (two) times daily.     VITAMIN A PO Take 20,000 Units by mouth daily.     VITAMIN E PO Take 260 mg by mouth daily.     Zinc 50 MG TABS Take 50 mg by mouth daily.     amLODipine (NORVASC) 5 MG tablet Take 1 tablet (5 mg total) by mouth daily. 90 tablet 1   fluticasone-salmeterol (ADVAIR) 100-50 MCG/ACT AEPB Inhale 1 puff into the lungs 2 (two) times daily. 60 each 3   hydrochlorothiazide (HYDRODIURIL) 25 MG tablet Take 1 tablet (25 mg total) by mouth daily. 90 tablet 1    metFORMIN (GLUCOPHAGE-XR) 500 MG 24 hr tablet Take 1 tablet (500 mg total) by mouth 2 (two) times daily. 180 tablet 1   simvastatin (ZOCOR) 20 MG tablet Take 1 tablet (20 mg total) by mouth daily. 90 tablet 1   benzonatate (TESSALON) 200 MG capsule Take 1 capsule (200 mg total) by mouth 2 (two) times daily as needed for cough. (Patient not taking: Reported on 04/22/2022) 20 capsule 0   doxycycline (VIBRA-TABS) 100 MG tablet Take 1 tablet (100 mg total) by mouth 2 (two) times daily for 7 days. (Patient not taking: Reported on 04/22/2022) 14 tablet 0   No facility-administered medications prior to visit.    Allergies  Allergen Reactions   Influenza Vaccine Live Other (See Comments)    Sever flu symptoms, chest tightness, wheezing   Shrimp [Shellfish Allergy] Itching    Allergic to shrimp.  Wheezing and tightness in chest.   Tdap [Tetanus-Diphth-Acell Pertussis] Other (See Comments)    Change in level of consciousness, altered mental state   Demerol [Meperidine] Nausea And Vomiting   Peanut-Containing Drug Products Other (See Comments)    wheezing   Chlorhexidine Gluconate [Chlorhexidine] Rash    Patient states she is sensitive to CHG solution and prefers not to use it   Latex Rash  Review of Systems  Constitutional:  Negative for fever and malaise/fatigue.  HENT:  Negative for congestion and sinus pain.   Eyes:  Negative for blurred vision.  Respiratory:  Positive for cough, sputum production, shortness of breath and wheezing (when coughing).   Cardiovascular:  Negative for chest pain, palpitations and leg swelling.  Gastrointestinal:  Negative for vomiting.  Musculoskeletal:  Negative for back pain.  Skin:  Negative for rash.  Neurological:  Negative for loss of consciousness and headaches.       Objective:    Physical Exam Vitals and nursing note reviewed.  Constitutional:      General: She is not in acute distress.    Appearance: Normal appearance. She is not ill-appearing.   HENT:     Head: Normocephalic and atraumatic.     Right Ear: External ear normal.     Left Ear: External ear normal.  Eyes:     Extraocular Movements: Extraocular movements intact.     Pupils: Pupils are equal, round, and reactive to light.  Cardiovascular:     Rate and Rhythm: Normal rate and regular rhythm.     Heart sounds: Normal heart sounds. No murmur heard.    No gallop.  Pulmonary:     Effort: Pulmonary effort is normal. No respiratory distress.     Breath sounds: Wheezing (when coughing) present. No rales.  Musculoskeletal:     Cervical back: Normal range of motion and neck supple.  Lymphadenopathy:     Cervical: No cervical adenopathy.  Skin:    General: Skin is warm.  Neurological:     Mental Status: She is alert and oriented to person, place, and time.  Psychiatric:        Mood and Affect: Mood normal.     BP 138/88 (BP Location: Right Arm, Patient Position: Sitting, Cuff Size: Normal)   Pulse 92   Temp 97.8 F (36.6 C) (Oral)   Resp 18   Ht '5\' 3"'$  (1.6 m)   Wt 175 lb 9.6 oz (79.7 kg)   SpO2 96%   BMI 31.11 kg/m  Wt Readings from Last 3 Encounters:  04/22/22 175 lb 9.6 oz (79.7 kg)  04/14/22 175 lb 9.6 oz (79.7 kg)  02/19/22 172 lb 6.4 oz (78.2 kg)       Assessment & Plan:  Bronchospasm -     Trelegy Ellipta; Inhale 1 puff into the lungs daily.  Dispense: 60 each; Refill: 11 -     DG Chest 2 View; Future -     CBC+Platelet+Hem Review -     Comprehensive metabolic panel -     levoFLOXacin; Take 1 tablet (500 mg total) by mouth daily for 7 days.  Dispense: 7 tablet; Refill: 0  Chronic cough -     CBC+Platelet+Hem Review -     Comprehensive metabolic panel -     levoFLOXacin; Take 1 tablet (500 mg total) by mouth daily for 7 days.  Dispense: 7 tablet; Refill: 0  Bronchitis Assessment & Plan: Con't  Cxr today Levaquin 500 mg daily x7 days  Mucinex as needed       I, [name], DO, personally preformed the services described in  this documentation.  All medical record entries made by the scribe were at my direction and in my presence.  I have reviewed the chart and discharge instructions (if applicable) and agree that the record reflects my personal performance and is accurate and complete. 04/22/2022  [name], DO  Lacretia Leigh as a Education administrator for [employer], DO.,have documented all relevant documentation on the behalf of [name], DO,as directed by  [name], DO while in the presence of [name], DO.

## 2022-04-22 NOTE — Assessment & Plan Note (Signed)
Con't  Cxr today Levaquin 500 mg daily x7 days  Mucinex as needed

## 2022-04-26 ENCOUNTER — Encounter: Payer: Self-pay | Admitting: Family Medicine

## 2022-04-27 ENCOUNTER — Encounter: Payer: Self-pay | Admitting: Family Medicine

## 2022-04-28 LAB — CBC+PLATELET+HEM REVIEW
BASO(ABSOLUTE): 0.1 10*3/uL (ref 0.0–0.2)
Basophils Manual: 1 %
EOS (ABSOLUTE VALUE): 0.5 10*3/uL — ABNORMAL HIGH (ref 0.0–0.4)
Eosinophils Manual: 6 %
Hematocrit: 42.8 % (ref 34.0–46.6)
Hemoglobin: 14 g/dL (ref 11.1–15.9)
Lymphocytes Absolute: 3.6 10*3/uL — ABNORMAL HIGH (ref 0.7–3.1)
Lymphocytes Manual: 42 %
MCH: 27.7 pg (ref 26.6–33.0)
MCHC: 32.7 g/dL (ref 31.5–35.7)
MCV: 85 fL (ref 79–97)
Monocytes Manual: 8 %
Monocytes(Absolute): 0.7 10*3/uL (ref 0.1–0.9)
Neutrophils Absolute: 3.7 10*3/uL (ref 1.4–7.0)
Neutrophils Manual: 43 %
PLATELET COMMENT: ADEQUATE
Platelets: 370 10*3/uL (ref 150–450)
RBC COMMENT: NORMAL
RBC: 5.06 x10E6/uL (ref 3.77–5.28)
RDW: 12.9 % (ref 11.7–15.4)
WBC: 8.6 10*3/uL (ref 3.4–10.8)

## 2022-04-29 ENCOUNTER — Ambulatory Visit
Admission: RE | Admit: 2022-04-29 | Discharge: 2022-04-29 | Disposition: A | Discharge status: Home / Self Care | Payer: PPO | Source: Ambulatory Visit | Attending: Hematology & Oncology | Admitting: Hematology & Oncology

## 2022-04-29 DIAGNOSIS — Z853 Personal history of malignant neoplasm of breast: Secondary | ICD-10-CM

## 2022-04-29 DIAGNOSIS — R928 Other abnormal and inconclusive findings on diagnostic imaging of breast: Secondary | ICD-10-CM | POA: Diagnosis not present

## 2022-04-29 HISTORY — DX: Malignant neoplasm of unspecified site of unspecified female breast: C50.919

## 2022-05-05 ENCOUNTER — Other Ambulatory Visit (HOSPITAL_BASED_OUTPATIENT_CLINIC_OR_DEPARTMENT_OTHER): Payer: Self-pay

## 2022-05-10 ENCOUNTER — Ambulatory Visit: Payer: PPO | Attending: General Surgery

## 2022-05-10 VITALS — Wt 176.2 lb

## 2022-05-10 DIAGNOSIS — Z853 Personal history of malignant neoplasm of breast: Secondary | ICD-10-CM | POA: Diagnosis not present

## 2022-05-10 DIAGNOSIS — C50912 Malignant neoplasm of unspecified site of left female breast: Secondary | ICD-10-CM | POA: Diagnosis not present

## 2022-05-10 DIAGNOSIS — Z483 Aftercare following surgery for neoplasm: Secondary | ICD-10-CM | POA: Insufficient documentation
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
## 2022-05-13 ENCOUNTER — Other Ambulatory Visit (HOSPITAL_BASED_OUTPATIENT_CLINIC_OR_DEPARTMENT_OTHER): Payer: Self-pay

## 2022-05-17 ENCOUNTER — Other Ambulatory Visit (HOSPITAL_BASED_OUTPATIENT_CLINIC_OR_DEPARTMENT_OTHER): Payer: Self-pay

## 2022-05-20 DIAGNOSIS — Z853 Personal history of malignant neoplasm of breast: Secondary | ICD-10-CM | POA: Diagnosis not present

## 2022-05-20 DIAGNOSIS — C50912 Malignant neoplasm of unspecified site of left female breast: Secondary | ICD-10-CM | POA: Diagnosis not present

## 2022-06-07 ENCOUNTER — Other Ambulatory Visit (HOSPITAL_BASED_OUTPATIENT_CLINIC_OR_DEPARTMENT_OTHER): Payer: Self-pay

## 2022-06-07 ENCOUNTER — Other Ambulatory Visit: Payer: Self-pay

## 2022-06-08 ENCOUNTER — Inpatient Hospital Stay: Payer: PPO | Attending: Hematology & Oncology

## 2022-06-08 ENCOUNTER — Other Ambulatory Visit: Payer: Self-pay

## 2022-06-08 ENCOUNTER — Inpatient Hospital Stay (HOSPITAL_BASED_OUTPATIENT_CLINIC_OR_DEPARTMENT_OTHER): Payer: PPO | Admitting: Hematology & Oncology

## 2022-06-08 ENCOUNTER — Encounter: Payer: Self-pay | Admitting: Hematology & Oncology

## 2022-06-08 VITALS — BP 137/62 | HR 81 | Temp 97.7°F | Resp 17 | Ht 63.0 in | Wt 174.0 lb

## 2022-06-08 DIAGNOSIS — C50412 Malignant neoplasm of upper-outer quadrant of left female breast: Secondary | ICD-10-CM | POA: Insufficient documentation

## 2022-06-08 DIAGNOSIS — Z79899 Other long term (current) drug therapy: Secondary | ICD-10-CM | POA: Insufficient documentation

## 2022-06-08 DIAGNOSIS — Z170 Estrogen receptor positive status [ER+]: Secondary | ICD-10-CM

## 2022-06-08 DIAGNOSIS — Z923 Personal history of irradiation: Secondary | ICD-10-CM | POA: Insufficient documentation

## 2022-06-08 LAB — CBC WITH DIFFERENTIAL (CANCER CENTER ONLY)
Abs Immature Granulocytes: 0.02 10*3/uL (ref 0.00–0.07)
Basophils Absolute: 0.1 10*3/uL (ref 0.0–0.1)
Basophils Relative: 1 %
Eosinophils Absolute: 0.3 10*3/uL (ref 0.0–0.5)
Eosinophils Relative: 4 %
HCT: 41.3 % (ref 36.0–46.0)
Hemoglobin: 13.5 g/dL (ref 12.0–15.0)
Immature Granulocytes: 0 %
Lymphocytes Relative: 40 %
Lymphs Abs: 3.2 10*3/uL (ref 0.7–4.0)
MCH: 28.4 pg (ref 26.0–34.0)
MCHC: 32.7 g/dL (ref 30.0–36.0)
MCV: 86.9 fL (ref 80.0–100.0)
Monocytes Absolute: 0.8 10*3/uL (ref 0.1–1.0)
Monocytes Relative: 9 %
Neutro Abs: 3.7 10*3/uL (ref 1.7–7.7)
Neutrophils Relative %: 46 %
Platelet Count: 335 10*3/uL (ref 150–400)
RBC: 4.75 MIL/uL (ref 3.87–5.11)
RDW: 13.2 % (ref 11.5–15.5)
WBC Count: 8.1 10*3/uL (ref 4.0–10.5)
nRBC: 0 % (ref 0.0–0.2)

## 2022-06-08 LAB — CMP (CANCER CENTER ONLY)
ALT: 15 U/L (ref 0–44)
AST: 16 U/L (ref 15–41)
Albumin: 4.3 g/dL (ref 3.5–5.0)
Alkaline Phosphatase: 41 U/L (ref 38–126)
Anion gap: 8 (ref 5–15)
BUN: 22 mg/dL (ref 8–23)
CO2: 32 mmol/L (ref 22–32)
Calcium: 10 mg/dL (ref 8.9–10.3)
Chloride: 100 mmol/L (ref 98–111)
Creatinine: 0.73 mg/dL (ref 0.44–1.00)
GFR, Estimated: >60 mL/min (ref >60)
Glucose, Bld: 95 mg/dL (ref 70–99)
Potassium: 4.6 mmol/L (ref 3.5–5.1)
Sodium: 140 mmol/L (ref 135–145)
Total Bilirubin: 0.6 mg/dL (ref 0.3–1.2)
Total Protein: 7 g/dL (ref 6.5–8.1)

## 2022-06-08 LAB — LACTATE DEHYDROGENASE: LDH: 130 U/L (ref 98–192)

## 2022-06-08 NOTE — Progress Notes (Signed)
Hematology and Oncology Follow Up Visit  Michelle [name] [id number] Sep 10, 1955 67 y.o. 06/08/2022   Principle Diagnosis:  Stage 1A ([id number]) infiltrating ductal carcinoma of the LEFT breast- -ER positive/HER2 negative --Oncotype score of 17.  Current Therapy:   Status post left lumpectomy on 06/11/2020 Status post radiation therapy-completed on 08/19/2020 Femara 2.5 mg p.o. daily -she has not yet started     Interim History:  Michelle [name] is back for follow-up.  As always, she goes back and forth up to [place].  She got back recently.  She is up there for the Winter.  She unfortunate was gained little bit of weight because she really cannot exercise all that much.  Hopefully now that she has back in [place], should be able to exercise a bit more.  She feels okay.  She has had no problems with nausea or vomiting.  There is no change in bowel or bladder habits.  She has had no pain issues.  Has been no headache.  She has had no leg swelling.  There has been no bleeding.  I think she may have gotten some COVID while up in [place].  She got through this without any problems.  Overall, I would have said that her performance status is probably ECOG 0.  Medications:  Current Outpatient Medications:    albuterol (VENTOLIN HFA) 108 (90 Base) MCG/ACT inhaler, Inhale 2 puffs into the lungs every 6 (six) hours as needed for wheezing or shortness of breath., Disp: 6.7 g, Rfl: 0   Alpha-Lipoic Acid 600 MG TABS, Take 600 mg by mouth daily., Disp: , Rfl:    amLODipine (NORVASC) 5 MG tablet, Take 1 tablet (5 mg total) by mouth daily., Disp: 90 tablet, Rfl: 1   Ascorbic Acid (VITAMIN C) 1000 MG tablet, Take 1,000 mg by mouth 2 (two) times daily., Disp: , Rfl:    ASPIRIN 81 PO, Take by mouth daily., Disp: , Rfl:    blood glucose meter kit and supplies KIT, Use to check blood glucose once daily, Disp: 1 each, Rfl: 0   Calcium-Magnesium (CAL-MAG PO), Take 1 tablet by mouth 3 (three) times  daily., Disp: , Rfl:    Cholecalciferol (DIALYVITE VITAMIN D 5000) 125 MCG (5000 UT) capsule, Take 5,000 Units by mouth daily., Disp: , Rfl:    CHROMIUM PO, Take 2 tablets by mouth 2 (two) times daily., Disp: , Rfl:    CINNAMON PO, Take 125 mg by mouth 3 (three) times daily., Disp: , Rfl:    co-enzyme Q-10 50 MG capsule, Take 100 mg by mouth daily., Disp: , Rfl:    Digestive Enzyme CAPS, Take 1 capsule by mouth 3 (three) times daily with meals., Disp: , Rfl:    Fluticasone-Umeclidin-Vilant (TRELEGY ELLIPTA) 100-62.5-25 MCG/ACT AEPB, Inhale 1 puff into the lungs daily., Disp: 60 each, Rfl: 11   GARLIC PO, Take 1 tablet by mouth 3 (three) times daily., Disp: , Rfl:    glucose blood test strip, Use to check blood glucose once daily, Disp: 100 each, Rfl: 0   hydrochlorothiazide (HYDRODIURIL) 25 MG tablet, Take 1 tablet (25 mg total) by mouth daily., Disp: 90 tablet, Rfl: 1   Lancets (ONETOUCH ULTRASOFT) lancets, Use to check blood glucose once daily, Disp: 100 each, Rfl: 0   loratadine (CLARITIN) 10 MG tablet, Take 10 mg by mouth at bedtime., Disp: , Rfl:    Melatonin 10 MG CAPS, Take 20 mg by mouth at bedtime., Disp: , Rfl:    Menaquinone-7 (VITAMIN K2  PO), Take 1 tablet by mouth daily., Disp: , Rfl:    metFORMIN (GLUCOPHAGE-XR) 500 MG 24 hr tablet, Take 1 tablet (500 mg total) by mouth 2 (two) times daily., Disp: 180 tablet, Rfl: 1   Methylcobalamin 16109 MCG SOLR, Inject 5,000 mcg as directed 2 (two) times a week. Tuesdays and Saturdays, Disp: , Rfl:    MULTIPLE VITAMIN PO, Take 2 capsules by mouth in the morning, at noon, and at bedtime., Disp: , Rfl:    NALTREXONE HCL PO, Take 3 mg by mouth at bedtime. Natrexone er 1.5 mg capsules x 2, Disp: , Rfl:    Omega-3 Fatty Acids (THE VERY FINEST FISH OIL) LIQD, Take 15 mLs by mouth daily., Disp: , Rfl:    OVER THE COUNTER MEDICATION, 10 mg at bedtime. CBD gummy., Disp: , Rfl:    POTASSIUM IODIDE PO, Take 225 mcg by mouth 3 (three) times daily., Disp: ,  Rfl:    Probiotic Product (PROBIOTIC PO), Take 1 capsule by mouth at bedtime., Disp: , Rfl:    QUERCETIN PO, Take 1 tablet by mouth 3 (three) times daily., Disp: , Rfl:    simvastatin (ZOCOR) 20 MG tablet, Take 1 tablet (20 mg total) by mouth daily., Disp: 90 tablet, Rfl: 1   thyroid (NP THYROID) 90 MG tablet, Take 1/2 tablet by mouth daily, Disp: 100 tablet, Rfl: 3   TURMERIC PO, Take 1,500 mg by mouth 2 (two) times daily., Disp: , Rfl:    VITAMIN A PO, Take 20,000 Units by mouth daily., Disp: , Rfl:    VITAMIN E PO, Take 260 mg by mouth daily., Disp: , Rfl:    Zinc 50 MG TABS, Take 50 mg by mouth daily., Disp: , Rfl:   Allergies:  Allergies  Allergen Reactions   Influenza Vaccine Live Other (See Comments)    Sever flu symptoms, chest tightness, wheezing   Shrimp [Shellfish Allergy] Itching    Allergic to shrimp.  Wheezing and tightness in chest.   Tdap [Tetanus-Diphth-Acell Pertussis] Other (See Comments)    Change in level of consciousness, altered mental state   Demerol [Meperidine] Nausea And Vomiting   Peanut-Containing Drug Products Other (See Comments)    wheezing   Chlorhexidine Gluconate [Chlorhexidine] Rash    Patient states she is sensitive to CHG solution and prefers not to use it   Latex Rash    Past Medical History, Surgical history, Social history, and Family History were reviewed and updated.  Review of Systems: Review of Systems  Constitutional: Negative.   HENT:  Negative.    Eyes: Negative.   Respiratory: Negative.    Cardiovascular: Negative.   Gastrointestinal: Negative.   Endocrine: Negative.   Genitourinary: Negative.    Musculoskeletal: Negative.   Skin: Negative.   Neurological: Negative.   Hematological: Negative.   Psychiatric/Behavioral: Negative.      Physical Exam:  height is  (1.6 m) and weight is 174 lb (78.9 kg). Her oral temperature is 97.7 F (36.5 C). Her blood pressure is 137/62 and her pulse is 81. Her respiration is 17 and  oxygen saturation is 99%.   Wt Readings from Last 3 Encounters:  06/08/22 174 lb (78.9 kg)  05/10/22 176 lb 4 oz (79.9 kg)  04/22/22 175 lb 9.6 oz (79.7 kg)    Physical Exam Vitals reviewed.  Constitutional:      Comments: Her breast exam shows right breast with no masses, edema or erythema.  There is no right axillary adenopathy.  Left breast  shows the lumpectomy that is well-healed.  There is at the edge of the areola at about the 1 o'clock position.  This is well-healed.  She has no breast masses.  There are some hyperpigmentation on the left breast from radiation.  The hyperpigmentation is mostly noted in the lower aspect of the left breast.  There is no left axillary adenopathy.  HENT:     Head: Normocephalic and atraumatic.  Eyes:     Pupils: Pupils are equal, round, and reactive to light.  Cardiovascular:     Rate and Rhythm: Normal rate and regular rhythm.     Heart sounds: Normal heart sounds.  Pulmonary:     Effort: Pulmonary effort is normal.     Breath sounds: Normal breath sounds.  Abdominal:     General: Bowel sounds are normal.     Palpations: Abdomen is soft.  Musculoskeletal:        General: No tenderness or deformity. Normal range of motion.     Cervical back: Normal range of motion.  Lymphadenopathy:     Cervical: No cervical adenopathy.  Skin:    General: Skin is warm and dry.     Findings: No erythema or rash.  Neurological:     Mental Status: She is alert and oriented to person, place, and time.  Psychiatric:        Behavior: Behavior normal.        Thought Content: Thought content normal.        Judgment: Judgment normal.      Lab Results  Component Value Date   WBC 8.1 06/08/2022   HGB 13.5 06/08/2022   HCT 41.3 06/08/2022   MCV 86.9 06/08/2022   PLT 335 06/08/2022     Chemistry      Component Value Date/Time   NA 140 06/08/2022 0927   K 4.6 06/08/2022 0927   CL 100 06/08/2022 0927   CO2 32 06/08/2022 0927   BUN 22 06/08/2022 0927    CREATININE 0.73 06/08/2022 0927   CREATININE 0.74 11/06/2019 1113      Component Value Date/Time   CALCIUM 10.0 06/08/2022 0927   ALKPHOS 41 06/08/2022 0927   AST 16 06/08/2022 0927   ALT 15 06/08/2022 0927   BILITOT 0.6 06/08/2022 1610      Impression and Plan: Ms. Grissett is a very nice 67 year old postmenopausal white female.  She has a early stage-stage IA-ductal carcinoma of the left breast.  She underwent lumpectomy .  She had a low Oncotype score.  She has completed radiation therapy.  She completed this in February.  Again, she is not on Femara.  She is taking her aromatase inhibitor natural products.  I know that she is quite busy trying to help her mom open [place].  She will go back up there again this summer.  We will go ahead and plan to see her back in October.  I will look forward to seeing her and talking to her about her trip up to [place].     Josph Macho, MD 4/23/202410:28 AM

## 2022-06-09 LAB — CANCER ANTIGEN 27.29: CA 27.29: 10.4 U/mL (ref 0.0–38.6)

## 2022-06-24 ENCOUNTER — Other Ambulatory Visit (HOSPITAL_BASED_OUTPATIENT_CLINIC_OR_DEPARTMENT_OTHER): Payer: Self-pay

## 2022-06-24 DIAGNOSIS — Z961 Presence of intraocular lens: Secondary | ICD-10-CM | POA: Diagnosis not present

## 2022-06-24 DIAGNOSIS — H18413 Arcus senilis, bilateral: Secondary | ICD-10-CM | POA: Diagnosis not present

## 2022-06-24 DIAGNOSIS — H26491 Other secondary cataract, right eye: Secondary | ICD-10-CM | POA: Diagnosis not present

## 2022-06-24 DIAGNOSIS — H02831 Dermatochalasis of right upper eyelid: Secondary | ICD-10-CM | POA: Diagnosis not present

## 2022-06-24 MED ORDER — PREDNISOLONE ACETATE 1 % OP SUSP
OPHTHALMIC | 0 refills | Status: DC
  Filled 2022-06-24: qty 5, 30d supply, fill #0

## 2022-06-29 ENCOUNTER — Encounter: Payer: Self-pay | Admitting: Family Medicine

## 2022-06-29 ENCOUNTER — Ambulatory Visit (INDEPENDENT_AMBULATORY_CARE_PROVIDER_SITE_OTHER): Payer: PPO | Admitting: Family Medicine

## 2022-06-29 VITALS — BP 128/68 | HR 92 | Temp 98.6°F | Resp 18 | Ht 63.0 in | Wt 172.8 lb

## 2022-06-29 DIAGNOSIS — I10 Essential (primary) hypertension: Secondary | ICD-10-CM | POA: Diagnosis not present

## 2022-06-29 DIAGNOSIS — E1169 Type 2 diabetes mellitus with other specified complication: Secondary | ICD-10-CM | POA: Diagnosis not present

## 2022-06-29 DIAGNOSIS — E039 Hypothyroidism, unspecified: Secondary | ICD-10-CM

## 2022-06-29 DIAGNOSIS — Z7984 Long term (current) use of oral hypoglycemic drugs: Secondary | ICD-10-CM

## 2022-06-29 DIAGNOSIS — E118 Type 2 diabetes mellitus with unspecified complications: Secondary | ICD-10-CM

## 2022-06-29 DIAGNOSIS — E785 Hyperlipidemia, unspecified: Secondary | ICD-10-CM | POA: Diagnosis not present

## 2022-06-29 DIAGNOSIS — Z0000 Encounter for general adult medical examination without abnormal findings: Secondary | ICD-10-CM

## 2022-06-29 DIAGNOSIS — E1165 Type 2 diabetes mellitus with hyperglycemia: Secondary | ICD-10-CM

## 2022-06-29 LAB — CBC WITH DIFFERENTIAL/PLATELET
Basophils Absolute: 0.1 10*3/uL (ref 0.0–0.1)
Basophils Relative: 1 % (ref 0.0–3.0)
Eosinophils Absolute: 0.2 10*3/uL (ref 0.0–0.7)
Eosinophils Relative: 3.5 % (ref 0.0–5.0)
HCT: 41.8 % (ref 36.0–46.0)
Hemoglobin: 13.9 g/dL (ref 12.0–15.0)
Lymphocytes Relative: 40.6 % (ref 12.0–46.0)
Lymphs Abs: 2.8 10*3/uL (ref 0.7–4.0)
MCHC: 33.4 g/dL (ref 30.0–36.0)
MCV: 86.3 fl (ref 78.0–100.0)
Monocytes Absolute: 0.7 10*3/uL (ref 0.1–1.0)
Monocytes Relative: 10.7 % (ref 3.0–12.0)
Neutro Abs: 3.1 10*3/uL (ref 1.4–7.7)
Neutrophils Relative %: 44.2 % (ref 43.0–77.0)
Platelets: 303 10*3/uL (ref 150.0–400.0)
RBC: 4.84 Mil/uL (ref 3.87–5.11)
RDW: 13.7 % (ref 11.5–15.5)
WBC: 6.9 10*3/uL (ref 4.0–10.5)

## 2022-06-29 LAB — LIPID PANEL
Cholesterol: 157 mg/dL (ref 0–200)
HDL: 64.5 mg/dL (ref >39.00)
LDL Cholesterol: 68 mg/dL (ref 0–99)
NonHDL: 92.53
Total CHOL/HDL Ratio: 2
Triglycerides: 121 mg/dL (ref 0.0–149.0)
VLDL: 24.2 mg/dL (ref 0.0–40.0)

## 2022-06-29 LAB — COMPREHENSIVE METABOLIC PANEL
ALT: 17 U/L (ref 0–35)
AST: 16 U/L (ref 0–37)
Albumin: 4.6 g/dL (ref 3.5–5.2)
Alkaline Phosphatase: 42 U/L (ref 39–117)
BUN: 20 mg/dL (ref 6–23)
CO2: 32 mEq/L (ref 19–32)
Calcium: 10.2 mg/dL (ref 8.4–10.5)
Chloride: 97 mEq/L (ref 96–112)
Creatinine, Ser: 0.73 mg/dL (ref 0.40–1.20)
GFR: 83.98 mL/min (ref >60.00)
Glucose, Bld: 96 mg/dL (ref 70–99)
Potassium: 4.2 mEq/L (ref 3.5–5.1)
Sodium: 138 mEq/L (ref 135–145)
Total Bilirubin: 0.8 mg/dL (ref 0.2–1.2)
Total Protein: 7.2 g/dL (ref 6.0–8.3)

## 2022-06-29 LAB — MICROALBUMIN / CREATININE URINE RATIO
Creatinine,U: 79.3 mg/dL
Microalb Creat Ratio: 2.9 mg/g (ref 0.0–30.0)
Microalb, Ur: 2.3 mg/dL — ABNORMAL HIGH (ref 0.0–1.9)

## 2022-06-29 LAB — HEMOGLOBIN A1C: Hgb A1c MFr Bld: 5.9 % (ref 4.6–6.5)

## 2022-06-29 NOTE — Progress Notes (Signed)
Subjective:   By signing my name below, I, [name], attest that this documentation has been prepared under the direction and in the presence of Donato Schultz, DO. 06/29/2022   Patient ID: Michelle [name], female    DOB: [birth year], 67 y.o.   MRN: [id number]  Chief Complaint  Patient presents with   Annual Exam    Pt states fasting     HPI Patient is in today for a comprehensive physical exam.  She complains of coating on her tongue and rash under her breast. She thinks it may be a yeast infection.  She reports doing well while using trelegy inhaler.  She denies fever, new moles, congestion, sinus pain, sore throat, chest pain, palpitations, cough, shortness of breath, wheezing, nausea, vomiting, abdominal pain, diarrhea, constipation, dysuria, frequency, hematuria, new muscle pain, new joint pain, or headaches at this time.  She is due for tetanus vaccine. She is allergic to pertussis and requires a tetanus vaccine without it.  Mammogram last completed 04/29/2022. Results are normal. Repeat in 1 year.  Pap smear last completed 04/09/2021. Results are normal.  Colonoscopy was last completed 11/10/2016. Results showed: - Melanosis in the colon. - Diverticulosis in the sigmoid colon. - Non- bleeding internal hemorrhoids. - The examination was otherwise normal. - No specimens collected. Repeat in 10 years.    Past Medical History:  Diagnosis Date   Allergy    Asthma    Breast cancer (HCC)    Bronchitis    Cancer (HCC)    Cataract 03/25/2020   Chronic kidney disease    COPD (chronic obstructive pulmonary disease) (HCC)    mild   Diabetes mellitus without complication (HCC)    Epstein Barr infection    GERD (gastroesophageal reflux disease) 2014   Goals of care, counseling/discussion 06/30/2020   History of hiatal hernia    History of kidney stones    Hyperlipidemia    Hypertension    stress induced/ work makes it worse.   Hypothyroidism    Neuromuscular  disorder (HCC)    Neuropathy   Obesity    Pneumonia    Thyroid disease     Past Surgical History:  Procedure Laterality Date   BREAST BIOPSY     BREAST EXCISIONAL BIOPSY Left 2022   BREAST LUMPECTOMY     BREAST LUMPECTOMY WITH RADIOACTIVE SEED AND SENTINEL LYMPH NODE BIOPSY Left 06/11/2020   Procedure: LEFT BREAST LUMPECTOMY WITH RADIOACTIVE SEED AND LEFT AXILLARY SENTINEL LYMPH NODE BIOPSY;  Surgeon: Emelia Loron, MD;  Location: [facility] OR;  Service: General;  Laterality: Left;   CATARACT EXTRACTION Right 10/02/2021   CATARACT EXTRACTION Left 07/31/2021   TONSILLECTOMY AND ADENOIDECTOMY  childhood   TUBAL LIGATION     VAGINAL HYSTERECTOMY  2011    Family History  Problem Relation Age of Onset   Heart disease Mother    Hyperlipidemia Mother    Stroke Mother    Hypertension Mother    Arthritis Mother    Hearing loss Mother    Varicose Veins Mother    Heart disease Father    Hyperlipidemia Father    Colon polyps Brother    Coronary artery disease Brother    Heart disease Brother    Prostate cancer Brother    Cancer Brother    Hyperlipidemia Brother    Hypertension Brother    Heart disease Brother    Hyperlipidemia Brother    Colon cancer Neg Hx    Stomach cancer Neg Hx  Rectal cancer Neg Hx    Esophageal cancer Neg Hx     Social History   Socioeconomic History   Marital status: Married    Spouse name: Not on file   Number of children: Not on file   Years of education: Not on file   Highest education level: Not on file  Occupational History   Occupation: RN  Tobacco Use   Smoking status: Never   Smokeless tobacco: Never  Vaping Use   Vaping Use: Never used  Substance and Sexual Activity   Alcohol use: No   Drug use: No   Sexual activity: Yes  Other Topics Concern   Not on file  Social History Narrative   Exercise- Walks about an hour a day   Right handed   Lives husband one story   Social Determinants of Health   Financial Resource Strain:  Low Risk  (03/23/2022)   Overall Financial Resource Strain (CARDIA)    Difficulty of Paying Living Expenses: Not very hard  Food Insecurity: No Food Insecurity (03/23/2022)   Hunger Vital Sign    Worried About Running Out of Food in the Last Year: Never true    Ran Out of Food in the Last Year: Never true  Transportation Needs: No Transportation Needs (03/23/2022)   PRAPARE - Administrator, Civil Service (Medical): No    Lack of Transportation (Non-Medical): No  Physical Activity: Sufficiently Active (03/23/2022)   Exercise Vital Sign    Days of Exercise per Week: 5 days    Minutes of Exercise per Session: 60 min  Stress: Stress Concern Present (03/23/2022)   [employer] of Occupational Health - Occupational Stress Questionnaire    Feeling of Stress : To some extent  Social Connections: Socially Integrated (03/23/2022)   Social Connection and Isolation Panel [NHANES]    Frequency of Communication with Friends and Family: More than three times a week    Frequency of Social Gatherings with Friends and Family: More than three times a week    Attends Religious Services: More than 4 times per year    Active Member of [employer] or Organizations: Yes    Attends Engineer, structural: More than 4 times per year    Marital Status: Married  Catering manager Violence: Not At Risk (03/24/2022)   Humiliation, Afraid, Rape, and Kick questionnaire    Fear of Current or Ex-Partner: No    Emotionally Abused: No    Physically Abused: No    Sexually Abused: No    Outpatient Medications Prior to Visit  Medication Sig Dispense Refill   albuterol (VENTOLIN HFA) 108 (90 Base) MCG/ACT inhaler Inhale 2 puffs into the lungs every 6 (six) hours as needed for wheezing or shortness of breath. 6.7 g 0   Alpha-Lipoic Acid 600 MG TABS Take 600 mg by mouth daily.     amLODipine (NORVASC) 5 MG tablet Take 1 tablet (5 mg total) by mouth daily. 90 tablet 1   Ascorbic Acid (VITAMIN C) 1000 MG tablet Take  1,000 mg by mouth 2 (two) times daily.     ASPIRIN 81 PO Take by mouth daily.     blood glucose meter kit and supplies KIT Use to check blood glucose once daily 1 each 0   Calcium-Magnesium (CAL-MAG PO) Take 1 tablet by mouth 3 (three) times daily.     Cholecalciferol (DIALYVITE VITAMIN D 5000) 125 MCG (5000 UT) capsule Take 5,000 Units by mouth daily.     CHROMIUM PO  Take 2 tablets by mouth 2 (two) times daily.     CINNAMON PO Take 125 mg by mouth 3 (three) times daily.     co-enzyme Q-10 50 MG capsule Take 100 mg by mouth daily.     Digestive Enzyme CAPS Take 1 capsule by mouth 3 (three) times daily with meals.     Fluticasone-Umeclidin-Vilant (TRELEGY ELLIPTA) 100-62.5-25 MCG/ACT AEPB Inhale 1 puff into the lungs daily. 60 each 11   GARLIC PO Take 1 tablet by mouth 3 (three) times daily.     glucose blood test strip Use to check blood glucose once daily 100 each 0   hydrochlorothiazide (HYDRODIURIL) 25 MG tablet Take 1 tablet (25 mg total) by mouth daily. 90 tablet 1   Lancets (ONETOUCH ULTRASOFT) lancets Use to check blood glucose once daily 100 each 0   loratadine (CLARITIN) 10 MG tablet Take 10 mg by mouth at bedtime.     Melatonin 10 MG CAPS Take 20 mg by mouth at bedtime.     Menaquinone-7 (VITAMIN K2 PO) Take 1 tablet by mouth daily.     metFORMIN (GLUCOPHAGE-XR) 500 MG 24 hr tablet Take 1 tablet (500 mg total) by mouth 2 (two) times daily. 180 tablet 1   Methylcobalamin 81191 MCG SOLR Inject 5,000 mcg as directed 2 (two) times a week. Tuesdays and Saturdays     MULTIPLE VITAMIN PO Take 2 capsules by mouth in the morning, at noon, and at bedtime.     NALTREXONE HCL PO Take 3 mg by mouth at bedtime. Natrexone er 1.5 mg capsules x 2     Omega-3 Fatty Acids (THE VERY FINEST FISH OIL) LIQD Take 15 mLs by mouth daily.     OVER THE COUNTER MEDICATION 10 mg at bedtime. CBD gummy.     POTASSIUM IODIDE PO Take 225 mcg by mouth 3 (three) times daily.     prednisoLONE acetate (PRED FORTE) 1 %  ophthalmic suspension Instill 1 drop into affected eye four times a day for 5 days then stop 5 mL 0   Probiotic Product (PROBIOTIC PO) Take 1 capsule by mouth at bedtime.     QUERCETIN PO Take 1 tablet by mouth 3 (three) times daily.     simvastatin (ZOCOR) 20 MG tablet Take 1 tablet (20 mg total) by mouth daily. 90 tablet 1   thyroid (NP THYROID) 90 MG tablet Take 1/2 tablet by mouth daily 100 tablet 3   TURMERIC PO Take 1,500 mg by mouth 2 (two) times daily.     VITAMIN A PO Take 20,000 Units by mouth daily.     VITAMIN E PO Take 260 mg by mouth daily.     Zinc 50 MG TABS Take 50 mg by mouth daily.     No facility-administered medications prior to visit.    Allergies  Allergen Reactions   Influenza Vaccine Live Other (See Comments)    Sever flu symptoms, chest tightness, wheezing   Shrimp [Shellfish Allergy] Itching    Allergic to shrimp.  Wheezing and tightness in chest.   Tdap [Tetanus-Diphth-Acell Pertussis] Other (See Comments)    Change in level of consciousness, altered mental state   Demerol [Meperidine] Nausea And Vomiting   Peanut-Containing Drug Products Other (See Comments)    wheezing   Chlorhexidine Gluconate [Chlorhexidine] Rash    Patient states she is sensitive to CHG solution and prefers not to use it   Latex Rash    Review of Systems  Constitutional:  Negative for fever.  HENT:  Negative for congestion, sinus pain and sore throat.        (+)coating over tongue  Respiratory:  Negative for cough, shortness of breath and wheezing.   Cardiovascular:  Negative for chest pain and palpitations.  Gastrointestinal:  Negative for abdominal pain, constipation, diarrhea, nausea and vomiting.  Genitourinary:  Negative for dysuria, frequency and hematuria.  Musculoskeletal:        (-)new muscle pain (-)new joint pain  Skin:        (-)New moles (+)rash under breasts   Neurological:  Negative for headaches.       Objective:    Physical Exam Constitutional:       General: She is not in acute distress.    Appearance: Normal appearance. She is not ill-appearing.  HENT:     Head: Normocephalic and atraumatic.     Right Ear: Tympanic membrane, ear canal and external ear normal.     Left Ear: Tympanic membrane, ear canal and external ear normal.  Eyes:     Extraocular Movements: Extraocular movements intact.     Pupils: Pupils are equal, round, and reactive to light.  Cardiovascular:     Rate and Rhythm: Normal rate and regular rhythm.     Heart sounds: Normal heart sounds. No murmur heard.    No gallop.  Pulmonary:     Effort: Pulmonary effort is normal. No respiratory distress.     Breath sounds: Normal breath sounds. No wheezing or rales.  Abdominal:     General: Bowel sounds are normal. There is no distension.     Palpations: Abdomen is soft.     Tenderness: There is no abdominal tenderness. There is no guarding.  Skin:    General: Skin is warm and dry.  Neurological:     Mental Status: She is alert and oriented to person, place, and time.  Psychiatric:        Judgment: Judgment normal.     BP 128/68 (BP Location: Right Arm, Patient Position: Sitting, Cuff Size: Normal)   Pulse 92   Temp 98.6 F (37 C) (Oral)   Resp 18   Ht 5\' 3"  (1.6 m)   Wt 172 lb 12.8 oz (78.4 kg)   SpO2 96%   BMI 30.61 kg/m  Wt Readings from Last 3 Encounters:  06/29/22 172 lb 12.8 oz (78.4 kg)  06/08/22 174 lb (78.9 kg)  05/10/22 176 lb 4 oz (79.9 kg)       Assessment & Plan:  Preventative health care Assessment & Plan: Ghm utd Check labs  Health Maintenance  Topic Date Due   DTaP/Tdap/Td (1 - Tdap) Never done   OPHTHALMOLOGY EXAM  06/12/2022   HEMOGLOBIN A1C  06/29/2022   COVID-19 Vaccine (3 - Pfizer risk series) 07/15/2022 (Originally 05/29/2019)   Zoster Vaccines- Shingrix (1 of 2) 09/29/2022 (Originally 03/09/1974)   Diabetic kidney evaluation - Urine ACR  12/30/2022   FOOT EXAM  12/30/2022   Medicare Annual Wellness (AWV)  03/25/2023   DEXA  SCAN  04/10/2023   MAMMOGRAM  04/29/2023   Diabetic kidney evaluation - eGFR measurement  06/08/2023   COLONOSCOPY (Pts 45-54yrs Insurance coverage will need to be confirmed)  11/12/2026   Pneumonia Vaccine 81+ Years old  Completed   Hepatitis C Screening  Completed   HPV VACCINES  Aged Out      Acquired hypothyroidism Assessment & Plan: Check labs  Lab Results  Component Value Date   TSH 1.25 12/29/2021     Orders: -  Thyroid Panel With TSH  Primary hypertension Assessment & Plan: Well controlled, no changes to meds. Encouraged heart healthy diet such as the DASH diet and exercise as tolerated.    Orders: -     CBC with Differential/Platelet  Hyperlipidemia associated with type 2 diabetes mellitus (HCC) -     Lipid panel -     Comprehensive metabolic panel -     Microalbumin / creatinine urine ratio  Controlled type 2 diabetes mellitus with complication, without long-term current use of insulin (HCC) -     Hemoglobin A1c -     Microalbumin / creatinine urine ratio  Hyperlipidemia, unspecified hyperlipidemia type Assessment & Plan: Encourage heart healthy diet such as MIND or DASH diet, increase exercise, avoid trans fats, simple carbohydrates and processed foods, consider a krill or fish or flaxseed oil cap daily.     Uncontrolled type 2 diabetes mellitus with hyperglycemia (HCC) Assessment & Plan: On metformin 500 mg      I, Donato Schultz, DO, personally preformed the services described in this documentation.  All medical record entries made by the scribe were at my direction and in my presence.  I have reviewed the chart and discharge instructions (if applicable) and agree that the record reflects my personal performance and is accurate and complete. 06/29/2022   I,[name],acting as a scribe for Donato Schultz, DO.,have documented all relevant documentation on the behalf of Donato Schultz, DO,as directed by  Donato Schultz, DO  while in the presence of Donato Schultz, DO.   Donato Schultz, DO

## 2022-06-29 NOTE — Assessment & Plan Note (Signed)
On metformin 500 mg

## 2022-06-29 NOTE — Assessment & Plan Note (Signed)
Encourage heart healthy diet such as MIND or DASH diet, increase exercise, avoid trans fats, simple carbohydrates and processed foods, consider a krill or fish or flaxseed oil cap daily.  °

## 2022-06-29 NOTE — Patient Instructions (Signed)
Preventive Care 65 Years and Older, Female Preventive care refers to lifestyle choices and visits with your health care provider that can promote health and wellness. Preventive care visits are also called wellness exams. What can I expect for my preventive care visit? Counseling Your health care provider may ask you questions about your: Medical history, including: Past medical problems. Family medical history. Pregnancy and menstrual history. History of falls. Current health, including: Memory and ability to understand (cognition). Emotional well-being. Home life and relationship well-being. Sexual activity and sexual health. Lifestyle, including: Alcohol, nicotine or tobacco, and drug use. Access to firearms. Diet, exercise, and sleep habits. Work and work environment. Sunscreen use. Safety issues such as seatbelt and bike helmet use. Physical exam Your health care provider will check your: Height and weight. These may be used to calculate your BMI (body mass index). BMI is a measurement that tells if you are at a healthy weight. Waist circumference. This measures the distance around your waistline. This measurement also tells if you are at a healthy weight and may help predict your risk of certain diseases, such as type 2 diabetes and high blood pressure. Heart rate and blood pressure. Body temperature. Skin for abnormal spots. What immunizations do I need?  Vaccines are usually given at various ages, according to a schedule. Your health care provider will recommend vaccines for you based on your age, medical history, and lifestyle or other factors, such as travel or where you work. What tests do I need? Screening Your health care provider may recommend screening tests for certain conditions. This may include: Lipid and cholesterol levels. Hepatitis C test. Hepatitis B test. HIV (human immunodeficiency virus) test. STI (sexually transmitted infection) testing, if you are at  risk. Lung cancer screening. Colorectal cancer screening. Diabetes screening. This is done by checking your blood sugar (glucose) after you have not eaten for a while (fasting). Mammogram. Talk with your health care provider about how often you should have regular mammograms. BRCA-related cancer screening. This may be done if you have a family history of breast, ovarian, tubal, or peritoneal cancers. Bone density scan. This is done to screen for osteoporosis. Talk with your health care provider about your test results, treatment options, and if necessary, the need for more tests. Follow these instructions at home: Eating and drinking  Eat a diet that includes fresh fruits and vegetables, whole grains, lean protein, and low-fat dairy products. Limit your intake of foods with high amounts of sugar, saturated fats, and salt. Take vitamin and mineral supplements as recommended by your health care provider. Do not drink alcohol if your health care provider tells you not to drink. If you drink alcohol: Limit how much you have to 0-1 drink a day. Know how much alcohol is in your drink. In the [place] one drink equals one 12 oz bottle of beer (355 mL), one 5 oz glass of wine (148 mL), or one 1 oz glass of hard liquor (44 mL). Lifestyle Brush your teeth every morning and night with fluoride toothpaste. Floss one time each day. Exercise for at least 30 minutes 5 or more days each week. Do not use any products that contain nicotine or tobacco. These products include cigarettes, chewing tobacco, and vaping devices, such as e-cigarettes. If you need help quitting, ask your health care provider. Do not use drugs. If you are sexually active, practice safe sex. Use a condom or other form of protection in order to prevent STIs. Take aspirin only as told by   your health care provider. Make sure that you understand how much to take and what form to take. Work with your health care provider to find out whether it  is safe and beneficial for you to take aspirin daily. Ask your health care provider if you need to take a cholesterol-lowering medicine (statin). Find healthy ways to manage stress, such as: Meditation, yoga, or listening to music. Journaling. Talking to a trusted person. Spending time with friends and family. Minimize exposure to UV radiation to reduce your risk of skin cancer. Safety Always wear your seat belt while driving or riding in a vehicle. Do not drive: If you have been drinking alcohol. Do not ride with someone who has been drinking. When you are tired or distracted. While texting. If you have been using any mind-altering substances or drugs. Wear a helmet and other protective equipment during sports activities. If you have firearms in your house, make sure you follow all gun safety procedures. What's next? Visit your health care provider once a year for an annual wellness visit. Ask your health care provider how often you should have your eyes and teeth checked. Stay up to date on all vaccines. This information is not intended to replace advice given to you by your health care provider. Make sure you discuss any questions you have with your health care provider. Document Revised: 07/30/2020 Document Reviewed: 07/30/2020 Elsevier Patient Education  2023 Elsevier Inc.   

## 2022-06-29 NOTE — Assessment & Plan Note (Signed)
Check labs  Lab Results  Component Value Date   TSH 1.25 12/29/2021

## 2022-06-29 NOTE — Assessment & Plan Note (Signed)
Ghm utd Check labs  Health Maintenance  Topic Date Due   DTaP/Tdap/Td (1 - Tdap) Never done   OPHTHALMOLOGY EXAM  06/12/2022   HEMOGLOBIN A1C  06/29/2022   COVID-19 Vaccine (3 - Pfizer risk series) 07/15/2022 (Originally 05/29/2019)   Zoster Vaccines- Shingrix (1 of 2) 09/29/2022 (Originally 03/09/1974)   Diabetic kidney evaluation - Urine ACR  12/30/2022   FOOT EXAM  12/30/2022   Medicare Annual Wellness (AWV)  03/25/2023   DEXA SCAN  04/10/2023   MAMMOGRAM  04/29/2023   Diabetic kidney evaluation - eGFR measurement  06/08/2023   COLONOSCOPY (Pts 45-40yrs Insurance coverage will need to be confirmed)  11/12/2026   Pneumonia Vaccine 24+ Years old  Completed   Hepatitis C Screening  Completed   HPV VACCINES  Aged Out

## 2022-06-29 NOTE — Assessment & Plan Note (Signed)
Well controlled, no changes to meds. Encouraged heart healthy diet such as the DASH diet and exercise as tolerated.  °

## 2022-06-30 LAB — THYROID PANEL WITH TSH
Free Thyroxine Index: 2.4 (ref 1.4–3.8)
T3 Uptake: 33 % (ref 22–35)
T4, Total: 7.3 ug/dL (ref 5.1–11.9)
TSH: 1.14 mIU/L (ref 0.40–4.50)

## 2022-07-01 DIAGNOSIS — H2512 Age-related nuclear cataract, left eye: Secondary | ICD-10-CM | POA: Diagnosis not present

## 2022-07-01 DIAGNOSIS — Z961 Presence of intraocular lens: Secondary | ICD-10-CM | POA: Diagnosis not present

## 2022-07-01 DIAGNOSIS — H2511 Age-related nuclear cataract, right eye: Secondary | ICD-10-CM | POA: Diagnosis not present

## 2022-07-20 ENCOUNTER — Other Ambulatory Visit (HOSPITAL_BASED_OUTPATIENT_CLINIC_OR_DEPARTMENT_OTHER): Payer: Self-pay

## 2022-07-26 ENCOUNTER — Other Ambulatory Visit (HOSPITAL_BASED_OUTPATIENT_CLINIC_OR_DEPARTMENT_OTHER): Payer: Self-pay

## 2022-07-26 MED ORDER — PILOCARPINE HCL 1 % OP SOLN
1.0000 [drp] | Freq: Every day | OPHTHALMIC | 0 refills | Status: AC
  Filled 2022-07-26: qty 15, 100d supply, fill #0

## 2022-07-27 ENCOUNTER — Other Ambulatory Visit (HOSPITAL_BASED_OUTPATIENT_CLINIC_OR_DEPARTMENT_OTHER): Payer: Self-pay

## 2022-07-28 ENCOUNTER — Other Ambulatory Visit (HOSPITAL_BASED_OUTPATIENT_CLINIC_OR_DEPARTMENT_OTHER): Payer: Self-pay

## 2022-08-02 ENCOUNTER — Other Ambulatory Visit (HOSPITAL_BASED_OUTPATIENT_CLINIC_OR_DEPARTMENT_OTHER): Payer: Self-pay

## 2022-08-05 ENCOUNTER — Other Ambulatory Visit (HOSPITAL_BASED_OUTPATIENT_CLINIC_OR_DEPARTMENT_OTHER): Payer: Self-pay

## 2022-08-05 MED ORDER — VALACYCLOVIR HCL 1 G PO TABS
1000.0000 mg | ORAL_TABLET | Freq: Two times a day (BID) | ORAL | 1 refills | Status: DC
  Filled 2022-08-05 (×2): qty 60, 30d supply, fill #0
  Filled 2022-09-05: qty 60, 30d supply, fill #1

## 2022-09-13 ENCOUNTER — Other Ambulatory Visit: Payer: Self-pay | Admitting: Family Medicine

## 2022-09-13 ENCOUNTER — Other Ambulatory Visit (HOSPITAL_BASED_OUTPATIENT_CLINIC_OR_DEPARTMENT_OTHER): Payer: Self-pay

## 2022-09-13 DIAGNOSIS — E1165 Type 2 diabetes mellitus with hyperglycemia: Secondary | ICD-10-CM

## 2022-09-13 MED ORDER — METFORMIN HCL ER 500 MG PO TB24
500.0000 mg | ORAL_TABLET | Freq: Two times a day (BID) | ORAL | 1 refills | Status: DC
Start: 2022-09-13 — End: 2023-04-19
  Filled 2022-09-13 – 2022-10-25 (×2): qty 180, 90d supply, fill #0
  Filled 2023-01-15: qty 180, 90d supply, fill #1

## 2022-09-15 DIAGNOSIS — H53143 Visual discomfort, bilateral: Secondary | ICD-10-CM | POA: Diagnosis not present

## 2022-09-15 DIAGNOSIS — E119 Type 2 diabetes mellitus without complications: Secondary | ICD-10-CM | POA: Diagnosis not present

## 2022-09-15 LAB — HM DIABETES EYE EXAM

## 2022-10-12 ENCOUNTER — Other Ambulatory Visit (HOSPITAL_BASED_OUTPATIENT_CLINIC_OR_DEPARTMENT_OTHER): Payer: Self-pay

## 2022-10-12 ENCOUNTER — Encounter: Payer: Self-pay | Admitting: Pharmacist

## 2022-10-12 NOTE — Progress Notes (Signed)
Pharmacy Quality Measure Review  This patient is appearing on a report for being at risk of failing the adherence measure for cholesterol (statin) medications this calendar year.   Medication: simvastatin 20mg   Last fill date: 06/07/2022 for 90 day supply  There were not refill records in Dr Tiajuana Amass database but per EPIC simvastatin was filled for 90 DS on 09/07/2022. Called [facility] pharmacy and verified Rx was filled and picked up 7/23/204  Insurance report was not up to date. No action needed at this time.    Henrene Pastor, PharmD Clinical Pharmacist [facility]  Population Health [phone]

## 2022-10-25 ENCOUNTER — Other Ambulatory Visit (HOSPITAL_BASED_OUTPATIENT_CLINIC_OR_DEPARTMENT_OTHER): Payer: Self-pay

## 2022-10-25 ENCOUNTER — Other Ambulatory Visit: Payer: Self-pay | Admitting: Family Medicine

## 2022-10-25 DIAGNOSIS — E1169 Type 2 diabetes mellitus with other specified complication: Secondary | ICD-10-CM

## 2022-10-26 ENCOUNTER — Other Ambulatory Visit (HOSPITAL_BASED_OUTPATIENT_CLINIC_OR_DEPARTMENT_OTHER): Payer: Self-pay

## 2022-10-26 MED ORDER — SIMVASTATIN 20 MG PO TABS
20.0000 mg | ORAL_TABLET | Freq: Every day | ORAL | 1 refills | Status: DC
Start: 2022-10-26 — End: 2023-04-22
  Filled 2022-10-26 – 2022-12-07 (×2): qty 90, 90d supply, fill #0
  Filled 2023-03-04: qty 90, 90d supply, fill #1

## 2022-10-31 ENCOUNTER — Ambulatory Visit
Admission: RE | Admit: 2022-10-31 | Discharge: 2022-10-31 | Disposition: A | Discharge status: Home / Self Care | Payer: PPO | Source: Ambulatory Visit | Attending: Family Medicine | Admitting: Family Medicine

## 2022-10-31 ENCOUNTER — Other Ambulatory Visit: Payer: Self-pay

## 2022-10-31 VITALS — BP 172/83 | HR 82 | Temp 97.6°F | Resp 16

## 2022-10-31 DIAGNOSIS — N390 Urinary tract infection, site not specified: Secondary | ICD-10-CM

## 2022-10-31 LAB — POCT URINALYSIS DIP (MANUAL ENTRY)
Bilirubin, UA: NEGATIVE
Glucose, UA: NEGATIVE mg/dL
Ketones, POC UA: NEGATIVE mg/dL
Nitrite, UA: NEGATIVE
Protein Ur, POC: NEGATIVE mg/dL
Spec Grav, UA: 1.02 (ref 1.010–1.025)
Urobilinogen, UA: 0.2 U/dL
pH, UA: 7 (ref 5.0–8.0)
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
## 2022-10-31 NOTE — Discharge Instructions (Signed)
Take nitrofurantion 100 mg 2 x a day for 5 days Lots of water Check my chart for culture results

## 2022-10-31 NOTE — ED Triage Notes (Signed)
Frequency, burning urination, blood clots in urine, cloudy urine, right flank pain, bladder spasms since this morning

## 2022-11-02 ENCOUNTER — Other Ambulatory Visit: Payer: Self-pay

## 2022-11-02 ENCOUNTER — Other Ambulatory Visit (HOSPITAL_BASED_OUTPATIENT_CLINIC_OR_DEPARTMENT_OTHER): Payer: Self-pay

## 2022-11-08 ENCOUNTER — Ambulatory Visit: Payer: PPO | Attending: General Surgery

## 2022-11-08 VITALS — Wt 180.4 lb

## 2022-11-08 DIAGNOSIS — Z483 Aftercare following surgery for neoplasm: Secondary | ICD-10-CM | POA: Insufficient documentation
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
## 2022-12-02 ENCOUNTER — Other Ambulatory Visit (HOSPITAL_BASED_OUTPATIENT_CLINIC_OR_DEPARTMENT_OTHER): Payer: Self-pay

## 2022-12-06 ENCOUNTER — Other Ambulatory Visit: Payer: Self-pay

## 2022-12-06 NOTE — Progress Notes (Unsigned)
Michelle [name] May 01, 1955 [id number]  Patient outreached by Nelma Rothman , PharmD Candidate on 10/21.2024.  Blood Pressure Readings: Last documented ambulatory systolic blood pressure: 172 Last documented ambulatory diastolic blood pressure: 83 Systolic BP today: 120 Diastolic BP today: 65 Does the patient have a validated home blood pressure machine?: Yes They report home readings Usually in the 120s/60s, peaks with stress  Medication review was performed. Is the patient taking their medications as prescribed?: Yes Differences from their prescribed list include: did not want to walk through entire med list but endorses she is taking all BP meds  The following barriers to adherence were noted: Does the patient have cost concerns?: No Does the patient have transportation concerns?: No Does the patient need assistance obtaining refills?: No Does the patient occassionally forget to take some of their prescribed medications?: No Does the patient feel like one/some of their medications make them feel poorly?: No Does the patient have questions or concerns about their medications?: No Does the patient have a follow up scheduled with their primary care provider/cardiologist?: Yes   Interventions: Interventions Completed: Medications were reviewed. 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  The patient has follow up scheduled: Nov 14 PCP: Zola Button, Grayling Congress, DO   Nelma Rothman, Student-PharmD

## 2022-12-07 ENCOUNTER — Other Ambulatory Visit: Payer: Self-pay

## 2022-12-07 ENCOUNTER — Other Ambulatory Visit (HOSPITAL_BASED_OUTPATIENT_CLINIC_OR_DEPARTMENT_OTHER): Payer: Self-pay

## 2022-12-12 ENCOUNTER — Other Ambulatory Visit: Payer: Self-pay | Admitting: Family Medicine

## 2022-12-12 DIAGNOSIS — E785 Hyperlipidemia, unspecified: Secondary | ICD-10-CM

## 2022-12-12 DIAGNOSIS — I10 Essential (primary) hypertension: Secondary | ICD-10-CM

## 2022-12-12 DIAGNOSIS — Z923 Personal history of irradiation: Secondary | ICD-10-CM

## 2022-12-13 ENCOUNTER — Other Ambulatory Visit (HOSPITAL_BASED_OUTPATIENT_CLINIC_OR_DEPARTMENT_OTHER): Payer: Self-pay

## 2022-12-13 ENCOUNTER — Other Ambulatory Visit: Payer: Self-pay

## 2022-12-13 MED ORDER — HYDROCHLOROTHIAZIDE 25 MG PO TABS
25.0000 mg | ORAL_TABLET | Freq: Every day | ORAL | 1 refills | Status: DC
  Filled 2022-12-13: qty 90, 90d supply, fill #0
  Filled 2023-03-15: qty 90, 90d supply, fill #1

## 2022-12-13 MED ORDER — AMLODIPINE BESYLATE 5 MG PO TABS
5.0000 mg | ORAL_TABLET | Freq: Every day | ORAL | 1 refills | Status: DC
  Filled 2022-12-13: qty 90, 90d supply, fill #0
  Filled 2023-03-15: qty 90, 90d supply, fill #1

## 2022-12-14 ENCOUNTER — Inpatient Hospital Stay: Payer: PPO | Admitting: Hematology & Oncology

## 2022-12-14 ENCOUNTER — Other Ambulatory Visit: Payer: Self-pay

## 2022-12-14 ENCOUNTER — Inpatient Hospital Stay: Payer: PPO | Attending: Hematology & Oncology

## 2022-12-14 ENCOUNTER — Encounter: Payer: Self-pay | Admitting: Hematology & Oncology

## 2022-12-14 VITALS — BP 140/55 | HR 84 | Temp 97.9°F | Resp 17 | Ht 63.0 in | Wt 179.0 lb

## 2022-12-14 DIAGNOSIS — C50412 Malignant neoplasm of upper-outer quadrant of left female breast: Secondary | ICD-10-CM | POA: Insufficient documentation

## 2022-12-14 DIAGNOSIS — Z79899 Other long term (current) drug therapy: Secondary | ICD-10-CM | POA: Insufficient documentation

## 2022-12-14 DIAGNOSIS — Z170 Estrogen receptor positive status [ER+]: Secondary | ICD-10-CM

## 2022-12-14 DIAGNOSIS — Z923 Personal history of irradiation: Secondary | ICD-10-CM | POA: Insufficient documentation

## 2022-12-14 LAB — CMP (CANCER CENTER ONLY)
ALT: 19 U/L (ref 0–44)
AST: 17 U/L (ref 15–41)
Albumin: 4.8 g/dL (ref 3.5–5.0)
Alkaline Phosphatase: 44 U/L (ref 38–126)
Anion gap: 8 (ref 5–15)
BUN: 24 mg/dL — ABNORMAL HIGH (ref 8–23)
CO2: 32 mmol/L (ref 22–32)
Calcium: 10.1 mg/dL (ref 8.9–10.3)
Chloride: 99 mmol/L (ref 98–111)
Creatinine: 0.81 mg/dL (ref 0.44–1.00)
GFR, Estimated: >60 mL/min (ref >60)
Glucose, Bld: 106 mg/dL — ABNORMAL HIGH (ref 70–99)
Potassium: 4.9 mmol/L (ref 3.5–5.1)
Sodium: 139 mmol/L (ref 135–145)
Total Bilirubin: 0.6 mg/dL (ref 0.3–1.2)
Total Protein: 7.5 g/dL (ref 6.5–8.1)

## 2022-12-14 LAB — CBC WITH DIFFERENTIAL (CANCER CENTER ONLY)
Abs Immature Granulocytes: 0.02 10*3/uL (ref 0.00–0.07)
Basophils Absolute: 0.1 10*3/uL (ref 0.0–0.1)
Basophils Relative: 1 %
Eosinophils Absolute: 0.6 10*3/uL — ABNORMAL HIGH (ref 0.0–0.5)
Eosinophils Relative: 8 %
HCT: 41.3 % (ref 36.0–46.0)
Hemoglobin: 13.5 g/dL (ref 12.0–15.0)
Immature Granulocytes: 0 %
Lymphocytes Relative: 39 %
Lymphs Abs: 3.2 10*3/uL (ref 0.7–4.0)
MCH: 29.4 pg (ref 26.0–34.0)
MCHC: 32.7 g/dL (ref 30.0–36.0)
MCV: 90 fL (ref 80.0–100.0)
Monocytes Absolute: 0.7 10*3/uL (ref 0.1–1.0)
Monocytes Relative: 9 %
Neutro Abs: 3.5 10*3/uL (ref 1.7–7.7)
Neutrophils Relative %: 43 %
Platelet Count: 341 10*3/uL (ref 150–400)
RBC: 4.59 MIL/uL (ref 3.87–5.11)
RDW: 12.4 % (ref 11.5–15.5)
WBC Count: 8 10*3/uL (ref 4.0–10.5)
nRBC: 0 % (ref 0.0–0.2)

## 2022-12-14 LAB — LACTATE DEHYDROGENASE: LDH: 141 U/L (ref 98–192)

## 2022-12-14 NOTE — Progress Notes (Signed)
Hematology and Oncology Follow Up Visit  Michelle [name] [id number] [birth year] 67 y.o. 12/14/2022   Principle Diagnosis:  Stage 1A ([id number]) infiltrating ductal carcinoma of the LEFT breast- -ER positive/HER2 negative --Oncotype score of 17.  Current Therapy:   Status post left lumpectomy on 06/11/2020 Status post radiation therapy-completed on 08/19/2020 Femara 2.5 mg p.o. daily -she has not yet started     Interim History:  Michelle [name] is back for follow-up.  Her mother finder passed away up in [place].  This was in August.  It was very peaceful.  Michelle [name] was so happy that she could be there to see her mother passed on to [place].  She is very happy that she had excepted Jesus.  She also had to go to [place].  She was in [place] for another couple weeks.  She is with some of the grandchildren.  She had a wonderful time.  She has gained a little bit of weight.  Now that she does not have to travel back and forth to [place], she will lose the weight.  She has had no problems with nausea or vomiting.  She has had no problems with bowels or bladder.  She has had no cough or shortness of breath.  Despite all of her travels, she has had no issues with COVID.  Overall, I would have said that her performance status is probably ECOG 0.     Medications:  Current Outpatient Medications:    albuterol (VENTOLIN HFA) 108 (90 Base) MCG/ACT inhaler, Inhale 2 puffs into the lungs every 6 (six) hours as needed for wheezing or shortness of breath., Disp: 6.7 g, Rfl: 0   Alpha-Lipoic Acid 600 MG TABS, Take 600 mg by mouth daily., Disp: , Rfl:    amLODipine (NORVASC) 5 MG tablet, Take 1 tablet (5 mg total) by mouth daily., Disp: 90 tablet, Rfl: 1   Ascorbic Acid (VITAMIN C) 1000 MG tablet, Take 1,000 mg by mouth 2 (two) times daily., Disp: , Rfl:    ASPIRIN 81 PO, Take by mouth daily., Disp: , Rfl:    blood glucose meter kit and supplies KIT, Use to check blood glucose once daily, Disp: 1 each,  Rfl: 0   Calcium-Magnesium (CAL-MAG PO), Take 1 tablet by mouth 3 (three) times daily., Disp: , Rfl:    Cholecalciferol (DIALYVITE VITAMIN D 5000) 125 MCG (5000 UT) capsule, Take 5,000 Units by mouth daily., Disp: , Rfl:    CHROMIUM PO, Take 2 tablets by mouth 2 (two) times daily., Disp: , Rfl:    CINNAMON PO, Take 125 mg by mouth 3 (three) times daily., Disp: , Rfl:    co-enzyme Q-10 50 MG capsule, Take 100 mg by mouth daily., Disp: , Rfl:    Digestive Enzyme CAPS, Take 1 capsule by mouth 3 (three) times daily with meals., Disp: , Rfl:    Fluticasone-Umeclidin-Vilant (TRELEGY ELLIPTA) 100-62.5-25 MCG/ACT AEPB, Inhale 1 puff into the lungs daily., Disp: 60 each, Rfl: 11   GARLIC PO, Take 1 tablet by mouth 3 (three) times daily., Disp: , Rfl:    glucose blood test strip, Use to check blood glucose once daily, Disp: 100 each, Rfl: 0   hydrochlorothiazide (HYDRODIURIL) 25 MG tablet, Take 1 tablet (25 mg total) by mouth daily., Disp: 90 tablet, Rfl: 1   Lancets (ONETOUCH ULTRASOFT) lancets, Use to check blood glucose once daily, Disp: 100 each, Rfl: 0   loratadine (CLARITIN) 10 MG tablet, Take 10 mg by mouth at bedtime., Disp: ,  Rfl:    Melatonin 10 MG CAPS, Take 20 mg by mouth at bedtime., Disp: , Rfl:    Menaquinone-7 (VITAMIN K2 PO), Take 1 tablet by mouth daily., Disp: , Rfl:    metFORMIN (GLUCOPHAGE-XR) 500 MG 24 hr tablet, Take 1 tablet (500 mg total) by mouth 2 (two) times daily., Disp: 180 tablet, Rfl: 1   Methylcobalamin 96295 MCG SOLR, Inject 5,000 mcg as directed 2 (two) times a week. Tuesdays and Saturdays, Disp: , Rfl:    MULTIPLE VITAMIN PO, Take 2 capsules by mouth in the morning, at noon, and at bedtime., Disp: , Rfl:    NALTREXONE HCL PO, Take 3 mg by mouth at bedtime. Natrexone er 1.5 mg capsules x 2, Disp: , Rfl:    Omega-3 Fatty Acids (THE VERY FINEST FISH OIL) LIQD, Take 15 mLs by mouth daily., Disp: , Rfl:    OVER THE COUNTER MEDICATION, 10 mg at bedtime. CBD gummy., Disp: ,  Rfl:    pilocarpine (PILOCAR) 1 % ophthalmic solution, Place 1 drop into the affected eye(s) once daily., Disp: 15 mL, Rfl: 0   POTASSIUM IODIDE PO, Take 225 mcg by mouth 3 (three) times daily., Disp: , Rfl:    Probiotic Product (PROBIOTIC PO), Take 1 capsule by mouth at bedtime., Disp: , Rfl:    QUERCETIN PO, Take 1 tablet by mouth 3 (three) times daily., Disp: , Rfl:    simvastatin (ZOCOR) 20 MG tablet, Take 1 tablet (20 mg total) by mouth daily., Disp: 90 tablet, Rfl: 1   thyroid (NP THYROID) 90 MG tablet, Take 1/2 tablet by mouth daily, Disp: 100 tablet, Rfl: 3   TURMERIC PO, Take 1,500 mg by mouth 2 (two) times daily., Disp: , Rfl:    VITAMIN A PO, Take 20,000 Units by mouth daily., Disp: , Rfl:    VITAMIN E PO, Take 260 mg by mouth daily., Disp: , Rfl:    Zinc 50 MG TABS, Take 50 mg by mouth daily., Disp: , Rfl:   Allergies:  Allergies  Allergen Reactions   Influenza Vaccine Live Other (See Comments)    Sever flu symptoms, chest tightness, wheezing   Shrimp [Shellfish Allergy] Itching    Allergic to shrimp.  Wheezing and tightness in chest.   Tdap [Tetanus-Diphth-Acell Pertussis] Other (See Comments)    Change in level of consciousness, altered mental state   Demerol [Meperidine] Nausea And Vomiting   Peanut-Containing Drug Products Other (See Comments)    wheezing   Chlorhexidine Gluconate [Chlorhexidine] Rash    Patient states she is sensitive to CHG solution and prefers not to use it   Latex Rash    Past Medical History, Surgical history, Social history, and Family History were reviewed and updated.  Review of Systems: Review of Systems  Constitutional: Negative.   HENT:  Negative.    Eyes: Negative.   Respiratory: Negative.    Cardiovascular: Negative.   Gastrointestinal: Negative.   Endocrine: Negative.   Genitourinary: Negative.    Musculoskeletal: Negative.   Skin: Negative.   Neurological: Negative.   Hematological: Negative.   Psychiatric/Behavioral:  Negative.      Physical Exam:  height is 5\' 3"  (1.6 m) and weight is 179 lb (81.2 kg). Her oral temperature is 97.9 F (36.6 C). Her blood pressure is 140/55 (abnormal) and her pulse is 84. Her respiration is 17 and oxygen saturation is 98%.   Wt Readings from Last 3 Encounters:  12/14/22 179 lb (81.2 kg)  11/08/22 180 lb 6 oz (  81.8 kg)  06/29/22 172 lb 12.8 oz (78.4 kg)    Physical Exam Vitals reviewed.  Constitutional:      Comments: Her breast exam shows right breast with no masses, edema or erythema.  There is no right axillary adenopathy.  Left breast shows the lumpectomy that is well-healed.  There is at the edge of the areola at about the 1 o'clock position.  This is well-healed.  She has no breast masses.  There are some hyperpigmentation on the left breast from radiation.  The hyperpigmentation is mostly noted in the lower aspect of the left breast.  There is no left axillary adenopathy.  HENT:     Head: Normocephalic and atraumatic.  Eyes:     Pupils: Pupils are equal, round, and reactive to light.  Cardiovascular:     Rate and Rhythm: Normal rate and regular rhythm.     Heart sounds: Normal heart sounds.  Pulmonary:     Effort: Pulmonary effort is normal.     Breath sounds: Normal breath sounds.  Abdominal:     General: Bowel sounds are normal.     Palpations: Abdomen is soft.  Musculoskeletal:        General: No tenderness or deformity. Normal range of motion.     Cervical back: Normal range of motion.  Lymphadenopathy:     Cervical: No cervical adenopathy.  Skin:    General: Skin is warm and dry.     Findings: No erythema or rash.  Neurological:     Mental Status: She is alert and oriented to person, place, and time.  Psychiatric:        Behavior: Behavior normal.        Thought Content: Thought content normal.        Judgment: Judgment normal.      Lab Results  Component Value Date   WBC 8.0 12/14/2022   HGB 13.5 12/14/2022   HCT 41.3 12/14/2022    MCV 90.0 12/14/2022   PLT 341 12/14/2022     Chemistry      Component Value Date/Time   NA 138 06/29/2022 0954   K 4.2 06/29/2022 0954   CL 97 06/29/2022 0954   CO2 32 06/29/2022 0954   BUN 20 06/29/2022 0954   CREATININE 0.73 06/29/2022 0954   CREATININE 0.73 06/08/2022 0927   CREATININE 0.74 11/06/2019 1113      Component Value Date/Time   CALCIUM 10.2 06/29/2022 0954   ALKPHOS 42 06/29/2022 0954   AST 16 06/29/2022 0954   AST 16 06/08/2022 0927   ALT 17 06/29/2022 0954   ALT 15 06/08/2022 0927   BILITOT 0.8 06/29/2022 0954   BILITOT 0.6 06/08/2022 0927      Impression and Plan: Ms. Bonura is a very nice 67 year old postmenopausal white female.  She has a early stage-stage IA-ductal carcinoma of the left breast.  She underwent lumpectomy .  She had a low Oncotype score.  She has completed radiation therapy.  She completed this in February.  Again, she is not on Femara.  She is taking her aromatase inhibitor natural products.  As always, she looks great.  I am so happy that she is able to stay in this area now.  I know it is hard that her mom passed away but again it was very peaceful.  We will go ahead and plan to get her back in another 6 months.    Josph Macho, MD 10/29/20249:53 AM

## 2022-12-30 ENCOUNTER — Encounter: Payer: Self-pay | Admitting: Family Medicine

## 2022-12-30 ENCOUNTER — Ambulatory Visit: Payer: PPO | Admitting: Family Medicine

## 2022-12-30 VITALS — BP 160/70 | HR 82 | Temp 98.2°F | Resp 18 | Ht 63.0 in | Wt 180.2 lb

## 2022-12-30 DIAGNOSIS — E2839 Other primary ovarian failure: Secondary | ICD-10-CM

## 2022-12-30 DIAGNOSIS — E1169 Type 2 diabetes mellitus with other specified complication: Secondary | ICD-10-CM

## 2022-12-30 DIAGNOSIS — N390 Urinary tract infection, site not specified: Secondary | ICD-10-CM

## 2022-12-30 DIAGNOSIS — E785 Hyperlipidemia, unspecified: Secondary | ICD-10-CM | POA: Diagnosis not present

## 2022-12-30 DIAGNOSIS — E039 Hypothyroidism, unspecified: Secondary | ICD-10-CM | POA: Diagnosis not present

## 2022-12-30 DIAGNOSIS — E559 Vitamin D deficiency, unspecified: Secondary | ICD-10-CM

## 2022-12-30 DIAGNOSIS — E118 Type 2 diabetes mellitus with unspecified complications: Secondary | ICD-10-CM

## 2022-12-30 DIAGNOSIS — I10 Essential (primary) hypertension: Secondary | ICD-10-CM | POA: Diagnosis not present

## 2022-12-30 DIAGNOSIS — E1165 Type 2 diabetes mellitus with hyperglycemia: Secondary | ICD-10-CM

## 2022-12-30 LAB — POC URINALSYSI DIPSTICK (AUTOMATED)
Bilirubin, UA: NEGATIVE
Blood, UA: NEGATIVE
Glucose, UA: NEGATIVE
Ketones, UA: NEGATIVE
Nitrite, UA: NEGATIVE
Protein, UA: NEGATIVE
Spec Grav, UA: <=1.005 — AB (ref 1.010–1.025)
Urobilinogen, UA: 0.2 U/dL
pH, UA: 7.5 (ref 5.0–8.0)

## 2022-12-30 LAB — LIPID PANEL
Cholesterol: 178 mg/dL (ref 0–200)
HDL: 58.1 mg/dL (ref >39.00)
LDL Cholesterol: 95 mg/dL (ref 0–99)
NonHDL: 120.2
Total CHOL/HDL Ratio: 3
Triglycerides: 128 mg/dL (ref 0.0–149.0)
VLDL: 25.6 mg/dL (ref 0.0–40.0)

## 2022-12-30 LAB — CBC WITH DIFFERENTIAL/PLATELET
Basophils Absolute: 0.1 10*3/uL (ref 0.0–0.1)
Basophils Relative: 1.2 % (ref 0.0–3.0)
Eosinophils Absolute: 0.6 10*3/uL (ref 0.0–0.7)
Eosinophils Relative: 9.4 % — ABNORMAL HIGH (ref 0.0–5.0)
HCT: 41.1 % (ref 36.0–46.0)
Hemoglobin: 13.5 g/dL (ref 12.0–15.0)
Lymphocytes Relative: 36.9 % (ref 12.0–46.0)
Lymphs Abs: 2.3 10*3/uL (ref 0.7–4.0)
MCHC: 32.9 g/dL (ref 30.0–36.0)
MCV: 89.9 fL (ref 78.0–100.0)
Monocytes Absolute: 0.7 10*3/uL (ref 0.1–1.0)
Monocytes Relative: 10.4 % (ref 3.0–12.0)
Neutro Abs: 2.7 10*3/uL (ref 1.4–7.7)
Neutrophils Relative %: 42.1 % — ABNORMAL LOW (ref 43.0–77.0)
Platelets: 305 10*3/uL (ref 150.0–400.0)
RBC: 4.57 Mil/uL (ref 3.87–5.11)
RDW: 13.1 % (ref 11.5–15.5)
WBC: 6.4 10*3/uL (ref 4.0–10.5)

## 2022-12-30 LAB — TSH: TSH: 0.83 u[IU]/mL (ref 0.35–5.50)

## 2022-12-30 LAB — COMPREHENSIVE METABOLIC PANEL
ALT: 18 U/L (ref 0–35)
AST: 18 U/L (ref 0–37)
Albumin: 4.6 g/dL (ref 3.5–5.2)
Alkaline Phosphatase: 49 U/L (ref 39–117)
BUN: 22 mg/dL (ref 6–23)
CO2: 31 meq/L (ref 19–32)
Calcium: 9.9 mg/dL (ref 8.4–10.5)
Chloride: 98 meq/L (ref 96–112)
Creatinine, Ser: 0.72 mg/dL (ref 0.40–1.20)
GFR: 85.08 mL/min (ref >60.00)
Glucose, Bld: 101 mg/dL — ABNORMAL HIGH (ref 70–99)
Potassium: 4.3 meq/L (ref 3.5–5.1)
Sodium: 136 meq/L (ref 135–145)
Total Bilirubin: 0.8 mg/dL (ref 0.2–1.2)
Total Protein: 7.2 g/dL (ref 6.0–8.3)

## 2022-12-30 LAB — VITAMIN D 25 HYDROXY (VIT D DEFICIENCY, FRACTURES): VITD: 115.46 ng/mL (ref 30.00–100.00)

## 2022-12-30 LAB — MICROALBUMIN / CREATININE URINE RATIO
Creatinine,U: 25.9 mg/dL
Microalb Creat Ratio: 2.7 mg/g (ref 0.0–30.0)
Microalb, Ur: <0.7 mg/dL (ref 0.0–1.9)

## 2022-12-30 LAB — HEMOGLOBIN A1C: Hgb A1c MFr Bld: 6.1 % (ref 4.6–6.5)

## 2022-12-30 NOTE — Assessment & Plan Note (Signed)
Encourage heart healthy diet such as MIND or DASH diet, increase exercise, avoid trans fats, simple carbohydrates and processed foods, consider a krill or fish or flaxseed oil cap daily.  °

## 2022-12-30 NOTE — Progress Notes (Signed)
Established Patient Office Visit  Subjective   Patient ID: Michelle [name], female    DOB: April 03, 1955  Age: 67 y.o. MRN: [id number]  Chief Complaint  Patient presents with   Hypertension   Diabetes   Hyperlipidemia   Follow-up    HPI Discussed the use of AI scribe software for clinical note transcription with the patient, who gave verbal consent to proceed.  History of Present Illness   The patient, with a history of urinary tract infections (UTIs), presents with a recent recurrence of UTI symptoms. She reports having been in good health until experiencing general malaise, backaches, urinary urgency, burning during urination, and blood in the urine. These symptoms prompted a visit to urgent care, where she was advised to take nitrofurantoin, a medication she had on hand due to her mother's history of chronic UTIs. The patient noticed an improvement in symptoms but continues to experience some nausea.  The patient also reports significant recent life stressors, including the passing of her mother three months ago, for whom she provided around-the-clock care. She has been traveling extensively, including to [place] and [place], and has been involved in physically demanding activities such as staining her decks and hosting a large birthday party. She notes that she has not been able to exercise or eat as she would like due to these circumstances, and she has noticed some weight gain and occasional swelling in her ankles.  The patient also mentions a possible allergic reaction to body wipes, which she suspects may have contributed to her UTI symptoms. She reports having used these wipes in her perineal area shortly before the onset of symptoms on two separate occasions. She has a history of skin allergies and plans to avoid using these wipes in the future.  The patient is eager to return to her regular routine and self-care practices now that her caregiving responsibilities and  travels have concluded. She is currently taking Trelegy for an unspecified condition, which she reports is working well for her. She has also recently seen an eye doctor and is planning to fill a new prescription for glasses. She has a history of breast cancer and is due for a mammogram and bone density test in March.      Patient Active Problem List   Diagnosis Date Noted   Bronchitis 04/22/2022   DM (diabetes mellitus) type II uncontrolled, periph vascular disorder 11/14/2020   Goals of care, counseling/discussion 06/30/2020   Malignant neoplasm of upper-outer quadrant of left breast in female, estrogen receptor positive (HCC) 05/14/2020   Uncontrolled type 2 diabetes mellitus with hyperglycemia (HCC) 04/24/2019   Vitamin D deficiency 04/24/2019   Low back pain 11/13/2018   Whiplash injury to neck 10/26/2018   Overweight (BMI 25.0-29.9) 07/12/2017   Preventative health care 09/06/2016   Diabetes mellitus without complication (HCC) 09/06/2016   Pyelonephritis 02/24/2016   Recurrent sinusitis 05/12/2015   Upper airway cough syndrome 10/16/2014   Dyspnea 10/15/2014   Renal calculi  incidental on CT imaging 03/14/2014   HTN (hypertension) 03/26/2013   Hyperlipidemia 03/26/2013   Osteopenia 03/26/2013   Hypothyroidism 03/26/2013   Menopausal symptoms 02/24/2012   Status post hysterectomy 02/24/2012   Past Medical History:  Diagnosis Date   Allergy    Asthma    Breast cancer (HCC)    Bronchitis    Cancer (HCC)    Cataract 03/25/2020   Chronic kidney disease    COPD (chronic obstructive pulmonary disease) (HCC)    mild   Diabetes  mellitus without complication (HCC)    Epstein Barr infection    GERD (gastroesophageal reflux disease) 2014   Goals of care, counseling/discussion 06/30/2020   History of hiatal hernia    History of kidney stones    Hyperlipidemia    Hypertension    stress induced/ work makes it worse.   Hypothyroidism    Neuromuscular disorder (HCC)     Neuropathy   Obesity    Pneumonia    Thyroid disease    Past Surgical History:  Procedure Laterality Date   BREAST BIOPSY     BREAST EXCISIONAL BIOPSY Left 2022   BREAST LUMPECTOMY     BREAST LUMPECTOMY WITH RADIOACTIVE SEED AND SENTINEL LYMPH NODE BIOPSY Left 06/11/2020   Procedure: LEFT BREAST LUMPECTOMY WITH RADIOACTIVE SEED AND LEFT AXILLARY SENTINEL LYMPH NODE BIOPSY;  Surgeon: Emelia Loron, MD;  Location: [facility] OR;  Service: General;  Laterality: Left;   CATARACT EXTRACTION Right 10/02/2021   CATARACT EXTRACTION Left 07/31/2021   TONSILLECTOMY AND ADENOIDECTOMY  childhood   TUBAL LIGATION     VAGINAL HYSTERECTOMY  2011   Social History   Tobacco Use   Smoking status: Never   Smokeless tobacco: Never  Vaping Use   Vaping status: Never Used  Substance Use Topics   Alcohol use: No   Drug use: No   Social History   Socioeconomic History   Marital status: Married    Spouse name: Not on file   Number of children: Not on file   Years of education: Not on file   Highest education level: Bachelor's degree (e.g., BA, AB, BS)  Occupational History   Occupation: RN  Tobacco Use   Smoking status: Never   Smokeless tobacco: Never  Vaping Use   Vaping status: Never Used  Substance and Sexual Activity   Alcohol use: No   Drug use: No   Sexual activity: Yes  Other Topics Concern   Not on file  Social History Narrative   Exercise- Walks about an hour a day   Right handed   Lives husband one story   Social Determinants of Health   Financial Resource Strain: Low Risk  (12/29/2022)   Overall Financial Resource Strain (CARDIA)    Difficulty of Paying Living Expenses: Not hard at all  Food Insecurity: No Food Insecurity (12/29/2022)   Hunger Vital Sign    Worried About Running Out of Food in the Last Year: Never true    Ran Out of Food in the Last Year: Never true  Transportation Needs: No Transportation Needs (12/29/2022)   PRAPARE - Scientist, research (physical sciences) (Medical): No    Lack of Transportation (Non-Medical): No  Physical Activity: Insufficiently Active (12/29/2022)   Exercise Vital Sign    Days of Exercise per Week: 3 days    Minutes of Exercise per Session: 30 min  Stress: No Stress Concern Present (12/29/2022)   [employer] of Occupational Health - Occupational Stress Questionnaire    Feeling of Stress : Only a little  Social Connections: Socially Integrated (12/29/2022)   Social Connection and Isolation Panel [NHANES]    Frequency of Communication with Friends and Family: More than three times a week    Frequency of Social Gatherings with Friends and Family: Three times a week    Attends Religious Services: More than 4 times per year    Active Member of Clubs or Organizations: No    Attends Banker Meetings: More than 4 times per year  Marital Status: Married  Catering manager Violence: Not At Risk (03/24/2022)   Humiliation, Afraid, Rape, and Kick questionnaire    Fear of Current or Ex-Partner: No    Emotionally Abused: No    Physically Abused: No    Sexually Abused: No   Family Status  Relation Name Status   Mother Aurea Graff Deceased   Father Princess Bruins   Brother  Alive   Brother  Alive   Brother Scientist, water quality (Not Specified)   Brother Actor (Not Specified)   Neg Hx  (Not Specified)  No partnership data on file   Family History  Problem Relation Age of Onset   Heart disease Mother    Hyperlipidemia Mother    Stroke Mother    Hypertension Mother    Arthritis Mother    Hearing loss Mother    Varicose Veins Mother    Heart disease Father    Hyperlipidemia Father    Colon polyps Brother    Coronary artery disease Brother    Heart disease Brother    Prostate cancer Brother    Cancer Brother    Hyperlipidemia Brother    Hypertension Brother    Heart disease Brother    Hyperlipidemia Brother    Colon cancer Neg Hx    Stomach cancer Neg Hx    Rectal cancer Neg Hx    Esophageal cancer Neg Hx     Allergies  Allergen Reactions   Influenza Vaccine Live Other (See Comments)    Sever flu symptoms, chest tightness, wheezing   Shrimp [Shellfish Allergy] Itching    Allergic to shrimp.  Wheezing and tightness in chest.   Tdap [Tetanus-Diphth-Acell Pertussis] Other (See Comments)    Change in level of consciousness, altered mental state   Demerol [Meperidine] Nausea And Vomiting   Peanut-Containing Drug Products Other (See Comments)    wheezing   Chlorhexidine Gluconate [Chlorhexidine] Rash    Patient states she is sensitive to CHG solution and prefers not to use it   Latex Rash      Review of Systems  Constitutional:  Negative for chills, fever and malaise/fatigue.  HENT:  Negative for congestion and hearing loss.   Eyes:  Negative for discharge.  Respiratory:  Negative for cough, sputum production and shortness of breath.   Cardiovascular:  Negative for chest pain, palpitations and leg swelling.  Gastrointestinal:  Negative for abdominal pain, blood in stool, constipation, diarrhea, heartburn, nausea and vomiting.  Genitourinary:  Negative for dysuria, frequency, hematuria and urgency.  Musculoskeletal:  Negative for back pain, falls and myalgias.  Skin:  Negative for rash.  Neurological:  Negative for dizziness, sensory change, loss of consciousness, weakness and headaches.  Endo/Heme/Allergies:  Negative for environmental allergies. Does not bruise/bleed easily.  Psychiatric/Behavioral:  Negative for depression and suicidal ideas. The patient is not nervous/anxious and does not have insomnia.       Objective:     BP (!) 160/70 (BP Location: Right Arm, Patient Position: Sitting, Cuff Size: Normal)   Pulse 82   Temp 98.2 F (36.8 C) (Oral)   Resp 18   Ht 5\' 3"  (1.6 m)   Wt 180 lb 3.2 oz (81.7 kg)   SpO2 98%   BMI 31.92 kg/m  BP Readings from Last 3 Encounters:  12/30/22 (!) 160/70  12/14/22 (!) 140/55  10/31/22 (!) 172/83   Wt Readings from Last 3 Encounters:   12/30/22 180 lb 3.2 oz (81.7 kg)  12/14/22 179 lb (81.2 kg)  11/08/22 180 lb 6 oz (  81.8 kg)   SpO2 Readings from Last 3 Encounters:  12/30/22 98%  12/14/22 98%  10/31/22 99%      Physical Exam Vitals and nursing note reviewed.  Constitutional:      General: She is not in acute distress.    Appearance: Normal appearance. She is well-developed.  HENT:     Head: Normocephalic and atraumatic.     Right Ear: Tympanic membrane, ear canal and external ear normal. There is no impacted cerumen.     Left Ear: Tympanic membrane, ear canal and external ear normal. There is no impacted cerumen.     Nose: Nose normal.     Mouth/Throat:     Mouth: Mucous membranes are moist.     Pharynx: Oropharynx is clear. No oropharyngeal exudate or posterior oropharyngeal erythema.  Eyes:     General: No scleral icterus.       Right eye: No discharge.        Left eye: No discharge.     Conjunctiva/sclera: Conjunctivae normal.     Pupils: Pupils are equal, round, and reactive to light.  Neck:     Thyroid: No thyromegaly or thyroid tenderness.     Vascular: No JVD.  Cardiovascular:     Rate and Rhythm: Normal rate and regular rhythm.     Heart sounds: Normal heart sounds. No murmur heard. Pulmonary:     Effort: Pulmonary effort is normal. No respiratory distress.     Breath sounds: Normal breath sounds.  Abdominal:     General: Bowel sounds are normal. There is no distension.     Palpations: Abdomen is soft. There is no mass.     Tenderness: There is no abdominal tenderness. There is no guarding or rebound.  Genitourinary:    Vagina: Normal.  Musculoskeletal:        General: Normal range of motion.     Cervical back: Normal range of motion and neck supple.     Right lower leg: No edema.     Left lower leg: No edema.  Lymphadenopathy:     Cervical: No cervical adenopathy.  Skin:    General: Skin is warm and dry.     Findings: No erythema or rash.  Neurological:     Mental Status: She is  alert and oriented to person, place, and time.     Cranial Nerves: No cranial nerve deficit.     Deep Tendon Reflexes: Reflexes are normal and symmetric.  Psychiatric:        Mood and Affect: Mood normal.        Behavior: Behavior normal.        Thought Content: Thought content normal.        Judgment: Judgment normal.      Results for orders placed or performed in visit on 12/30/22  POCT Urinalysis Dipstick (Automated)  Result Value Ref Range   Color, UA yellow    Clarity, UA clear    Glucose, UA Negative Negative   Bilirubin, UA Negative    Ketones, UA Negative    Spec Grav, UA <=1.005 (A) 1.010 - 1.025   Blood, UA Negative    pH, UA 7.5 5.0 - 8.0   Protein, UA Negative Negative   Urobilinogen, UA 0.2 0.2 or 1.0 E.U./dL   Nitrite, UA Negative    Leukocytes, UA Trace (A) Negative    Last CBC Lab Results  Component Value Date   WBC 8.0 12/14/2022   HGB 13.5 12/14/2022   HCT 41.3  12/14/2022   MCV 90.0 12/14/2022   MCH 29.4 12/14/2022   RDW 12.4 12/14/2022   PLT 341 12/14/2022   Last metabolic panel Lab Results  Component Value Date   GLUCOSE 106 (H) 12/14/2022   NA 139 12/14/2022   K 4.9 12/14/2022   CL 99 12/14/2022   CO2 32 12/14/2022   BUN 24 (H) 12/14/2022   CREATININE 0.81 12/14/2022   GFRNONAA >60 12/14/2022   CALCIUM 10.1 12/14/2022   PROT 7.5 12/14/2022   ALBUMIN 4.8 12/14/2022   BILITOT 0.6 12/14/2022   ALKPHOS 44 12/14/2022   AST 17 12/14/2022   ALT 19 12/14/2022   ANIONGAP 8 12/14/2022   Last lipids Lab Results  Component Value Date   CHOL 157 06/29/2022   HDL 64.50 06/29/2022   LDLCALC 68 06/29/2022   TRIG 121.0 06/29/2022   CHOLHDL 2 06/29/2022   Last hemoglobin A1c Lab Results  Component Value Date   HGBA1C 5.9 06/29/2022   Last thyroid functions Lab Results  Component Value Date   TSH 1.14 06/29/2022   T4TOTAL 7.3 06/29/2022   Last vitamin D Lab Results  Component Value Date   VD25OH 116.56 (HH) 06/16/2021   Last  vitamin B12 and Folate No results found for: "VITAMINB12", "FOLATE"    The 10-year ASCVD risk score (Arnett DK, et al., 2019) is: 27.6%    Assessment & Plan:   Problem List Items Addressed This Visit       Unprioritized   Vitamin D deficiency    Check labs       Uncontrolled type 2 diabetes mellitus with hyperglycemia (HCC)    hgba1c to be checked,  minimize simple carbs. Increase exercise as tolerated. Continue current meds       Hypothyroidism - Primary    Check tsh       Relevant Orders   TSH   Hyperlipidemia    Encourage heart healthy diet such as MIND or DASH diet, increase exercise, avoid trans fats, simple carbohydrates and processed foods, consider a krill or fish or flaxseed oil cap daily.        Relevant Orders   Lipid panel   Comprehensive metabolic panel   HTN (hypertension)    Well controlled, no changes to meds. Encouraged heart healthy diet such as the DASH diet and exercise as tolerated.        Relevant Orders   Lipid panel   CBC with Differential/Platelet   Comprehensive metabolic panel   Hemoglobin A1c   Other Visit Diagnoses     Hyperlipidemia associated with type 2 diabetes mellitus (HCC)       Relevant Orders   Lipid panel   Comprehensive metabolic panel   Controlled type 2 diabetes mellitus with complication, without long-term current use of insulin (HCC)       Relevant Orders   Lipid panel   Comprehensive metabolic panel   Hemoglobin A1c   Microalbumin / creatinine urine ratio   Urinary tract infection without hematuria, site unspecified       Relevant Orders   POCT Urinalysis Dipstick (Automated) (Completed)   Urine Culture   Estrogen deficiency       Relevant Orders   DG BONE DENSITY (DXA)   VITAMIN D 25 Hydroxy (Vit-D Deficiency, Fractures)     Assessment and Plan    Urinary Tract Infection (UTI)   She exhibits symptoms of a recurrent UTI, including malaise, back pain, urgency, dysuria, and hematuria, with partial  improvement after starting nitrofurantoin 100 mg  for 5 days. A possible allergic reaction to Prevail body wipes was noted. We will continue nitrofurantoin 100 mg for the full course and advise avoiding Prevail body wipes. Monitoring of symptoms is crucial, and she should return if there is no improvement or if symptoms worsen. We discussed the risks of an untreated UTI, including the progression to pyelonephritis.  Chronic Obstructive Pulmonary Disease (COPD)   Her COPD symptoms are well-controlled with Trelegy, and no new issues have been reported. We will continue the current COPD management with Trelegy, emphasizing the benefits of its continued use for symptom management and prevention of exacerbations.  Peripheral Edema   She experiences intermittent ankle and foot swelling, likely related to prolonged standing and recent physical activity. We advised on monitoring for persistent or worsening edema and recommended leg elevation and reducing prolonged standing when possible to manage symptoms.  General Health Maintenance   She is due for a mammogram and bone density screening in March and prefers screenings at the breast center. A recent eye exam was completed, with a prescription pending. We will schedule the mammogram and bone density screening for March and fill the eye prescription from Bl[facility] at [facility]. Checking the Vitamin D level is necessary due to current supplementation with 7500 IU of Vitamin D. We discussed the importance of regular screenings for early detection and management.  Follow-up   A follow-up after her husband's surgery is planned to reassess health and stress levels.        No follow-ups on file.    Donato Schultz, DO

## 2022-12-30 NOTE — Assessment & Plan Note (Signed)
hgba1c to be checked, minimize simple carbs. Increase exercise as tolerated. Continue current meds  

## 2022-12-30 NOTE — Assessment & Plan Note (Signed)
Check labs 

## 2022-12-30 NOTE — Assessment & Plan Note (Signed)
Well controlled, no changes to meds. Encouraged heart healthy diet such as the DASH diet and exercise as tolerated.  °

## 2022-12-30 NOTE — Assessment & Plan Note (Signed)
Check tsh 

## 2022-12-31 ENCOUNTER — Telehealth: Payer: Self-pay

## 2022-12-31 LAB — URINE CULTURE
MICRO NUMBER:: 15730985
Result:: NO GROWTH
SPECIMEN QUALITY:: ADEQUATE

## 2022-12-31 NOTE — Telephone Encounter (Signed)
Lab Name [facility] Phone Number [phone] Lab Tech Name Saa Chief Complaint Lab Result (Critical or Stat) Call Type Lab Send to RN Reason for Call Report lab results Initial Comment Caller is calling with critical lab results. Translation No Nurse Assessment Nurse: Daphine Deutscher, RN, Melanie Date/Time (Eastern Time): 12/30/2022 5:29:20 PM Is there an on-call provider listed? ---Yes Please list name of person reporting value (Lab Employee) and a contact number. ---[phone] Please document the following items: Lab name Lab value (read back to lab to verify) Reference range for lab value Date and time blood was drawn Collect time of birth for bilirubin results ---vitamin D 115.46 ref 30-100.00 drawn today at 10:55 ordered by Dr Zola Button Please collect the patient contact information from the lab. (name, phone number and address) ---[phone] Disp. Time Lamount Cohen Time) Disposition Final User 12/30/2022 5:35:09 PM Paged On Call back to [facility], RN, Shawna Orleans 12/30/2022 5:57:45 PM Paged On Call back to [facility], RN, Shawna Orleans 12/30/2022 6:01:24 PM Clinical Call Yes Daphine Deutscher, RN, Melanie Final Disposition 12/30/2022 6:01:24 PM Clinical Call Yes Daphine Deutscher, RN, Shawna Orleans

## 2022-12-31 NOTE — Telephone Encounter (Signed)
  Please decrease vita d3 2000 u daily  Written by Donato Schultz, DO on 12/31/2022 11:11 AM EST View Past Comments

## 2023-01-04 DIAGNOSIS — Z6831 Body mass index (BMI) 31.0-31.9, adult: Secondary | ICD-10-CM | POA: Diagnosis not present

## 2023-01-04 DIAGNOSIS — Z779 Other contact with and (suspected) exposures hazardous to health: Secondary | ICD-10-CM | POA: Diagnosis not present

## 2023-01-04 DIAGNOSIS — Z1272 Encounter for screening for malignant neoplasm of vagina: Secondary | ICD-10-CM | POA: Diagnosis not present

## 2023-01-04 DIAGNOSIS — Z1151 Encounter for screening for human papillomavirus (HPV): Secondary | ICD-10-CM | POA: Diagnosis not present

## 2023-01-04 DIAGNOSIS — Z853 Personal history of malignant neoplasm of breast: Secondary | ICD-10-CM | POA: Diagnosis not present

## 2023-01-28 ENCOUNTER — Other Ambulatory Visit (HOSPITAL_BASED_OUTPATIENT_CLINIC_OR_DEPARTMENT_OTHER): Payer: Self-pay

## 2023-02-01 ENCOUNTER — Other Ambulatory Visit (HOSPITAL_BASED_OUTPATIENT_CLINIC_OR_DEPARTMENT_OTHER): Payer: Self-pay

## 2023-02-02 ENCOUNTER — Encounter: Payer: Self-pay | Admitting: Family Medicine

## 2023-02-02 NOTE — Telephone Encounter (Signed)
I see notes from [facility] in [place], but doesn't indicate whether pt has Retinopathy. Letter sent for eye exam notes.

## 2023-02-03 ENCOUNTER — Encounter: Payer: Self-pay | Admitting: *Deleted

## 2023-02-21 ENCOUNTER — Other Ambulatory Visit (HOSPITAL_BASED_OUTPATIENT_CLINIC_OR_DEPARTMENT_OTHER): Payer: Self-pay

## 2023-02-28 ENCOUNTER — Other Ambulatory Visit (HOSPITAL_BASED_OUTPATIENT_CLINIC_OR_DEPARTMENT_OTHER): Payer: Self-pay

## 2023-03-15 ENCOUNTER — Other Ambulatory Visit (HOSPITAL_BASED_OUTPATIENT_CLINIC_OR_DEPARTMENT_OTHER): Payer: Self-pay

## 2023-03-15 MED ORDER — TRELEGY ELLIPTA 100-62.5-25 MCG/ACT IN AEPB
1.0000 | INHALATION_SPRAY | Freq: Every day | RESPIRATORY_TRACT | 3 refills | Status: DC
  Filled 2023-03-15: qty 60, 30d supply, fill #0

## 2023-03-16 ENCOUNTER — Other Ambulatory Visit: Payer: Self-pay | Admitting: Family Medicine

## 2023-03-16 DIAGNOSIS — N6320 Unspecified lump in the left breast, unspecified quadrant: Secondary | ICD-10-CM

## 2023-03-22 ENCOUNTER — Ambulatory Visit (HOSPITAL_BASED_OUTPATIENT_CLINIC_OR_DEPARTMENT_OTHER)
Admission: RE | Admit: 2023-03-22 | Discharge: 2023-03-22 | Disposition: A | Discharge status: Home / Self Care | Payer: PPO | Source: Ambulatory Visit | Attending: Family Medicine | Admitting: Family Medicine

## 2023-03-22 DIAGNOSIS — M85832 Other specified disorders of bone density and structure, left forearm: Secondary | ICD-10-CM | POA: Diagnosis not present

## 2023-03-22 DIAGNOSIS — E2839 Other primary ovarian failure: Secondary | ICD-10-CM | POA: Diagnosis not present

## 2023-03-24 ENCOUNTER — Encounter: Payer: Self-pay | Admitting: Family Medicine

## 2023-03-28 ENCOUNTER — Other Ambulatory Visit (HOSPITAL_BASED_OUTPATIENT_CLINIC_OR_DEPARTMENT_OTHER): Payer: Self-pay

## 2023-03-28 ENCOUNTER — Other Ambulatory Visit: Payer: Self-pay

## 2023-03-28 MED ORDER — ALENDRONATE SODIUM 70 MG PO TABS
70.0000 mg | ORAL_TABLET | ORAL | 3 refills | Status: DC
  Filled 2023-03-28: qty 12, 84d supply, fill #0

## 2023-04-11 ENCOUNTER — Other Ambulatory Visit: Payer: Self-pay | Admitting: Family Medicine

## 2023-04-11 ENCOUNTER — Other Ambulatory Visit (HOSPITAL_BASED_OUTPATIENT_CLINIC_OR_DEPARTMENT_OTHER): Payer: Self-pay

## 2023-04-11 MED ORDER — TRELEGY ELLIPTA 100-62.5-25 MCG/ACT IN AEPB
1.0000 | INHALATION_SPRAY | Freq: Every day | RESPIRATORY_TRACT | 3 refills | Status: DC
  Filled 2023-04-11: qty 180, 90d supply, fill #0
  Filled 2023-04-11: qty 60, 30d supply, fill #0

## 2023-04-12 ENCOUNTER — Other Ambulatory Visit (HOSPITAL_BASED_OUTPATIENT_CLINIC_OR_DEPARTMENT_OTHER): Payer: Self-pay

## 2023-04-19 ENCOUNTER — Ambulatory Visit (INDEPENDENT_AMBULATORY_CARE_PROVIDER_SITE_OTHER): Payer: PPO

## 2023-04-19 ENCOUNTER — Other Ambulatory Visit: Payer: Self-pay | Admitting: Family Medicine

## 2023-04-19 VITALS — Ht 63.0 in | Wt 161.0 lb

## 2023-04-19 DIAGNOSIS — Z0000 Encounter for general adult medical examination without abnormal findings: Secondary | ICD-10-CM

## 2023-04-19 DIAGNOSIS — E1165 Type 2 diabetes mellitus with hyperglycemia: Secondary | ICD-10-CM

## 2023-04-19 NOTE — Progress Notes (Signed)
 Subjective:   Michelle [name] is a 68 y.o. female who presents for Medicare Annual (Subsequent) preventive examination.  Visit Complete: Virtual I connected with  Michelle [name] on 04/19/23 by a audio enabled telemedicine application and verified that I am speaking with the correct person using two identifiers.  Patient Location: Home  Provider Location: Home Office  I discussed the limitations of evaluation and management by telemedicine. The patient expressed understanding and agreed to proceed.  Vital Signs: Because this visit was a virtual/telehealth visit, some criteria may be missing or patient reported. Any vitals not documented were not able to be obtained and vitals that have been documented are patient reported.  Patient Medicare AWV questionnaire was completed by the patient on 04/18/23; I have confirmed that all information answered by patient is correct and no changes since this date.  Cardiac Risk Factors include: advanced age (>88men, >25 women);none;diabetes mellitus;hypertension     Objective:    Today's Vitals   04/19/23 0814  Weight: 161 lb (73 kg)  Height: 5\' 3"  (1.6 m)   Body mass index is 28.52 kg/m.     04/19/2023    8:25 AM 12/14/2022    9:27 AM 06/08/2022   10:07 AM 03/24/2022   11:02 AM 12/14/2021    9:50 AM 12/01/2021   11:57 AM 07/21/2021   10:08 AM  Advanced Directives  Does Patient Have a Medical Advance Directive? No No No No No No No  Would patient like information on creating a medical advance directive? No - Patient declined No - Patient declined No - Patient declined No - Patient declined No - Patient declined No - Patient declined No - Patient declined    Current Medications (verified) Outpatient Encounter Medications as of 04/19/2023  Medication Sig   alendronate (FOSAMAX) 70 MG tablet Take 1 tablet (70 mg total) by mouth every 7 (seven) days. Take with a full glass of water on an empty stomach. (Patient not taking: Reported on 04/19/2023)    albuterol (VENTOLIN HFA) 108 (90 Base) MCG/ACT inhaler Inhale 2 puffs into the lungs every 6 (six) hours as needed for wheezing or shortness of breath.   Alpha-Lipoic Acid 600 MG TABS Take 600 mg by mouth daily.   amLODipine (NORVASC) 5 MG tablet Take 1 tablet (5 mg total) by mouth daily.   Ascorbic Acid (VITAMIN C) 1000 MG tablet Take 1,000 mg by mouth 2 (two) times daily.   ASPIRIN 81 PO Take by mouth daily.   blood glucose meter kit and supplies KIT Use to check blood glucose once daily   Calcium-Magnesium (CAL-MAG PO) Take 1 tablet by mouth 3 (three) times daily.   Cholecalciferol (DIALYVITE VITAMIN D 5000) 125 MCG (5000 UT) capsule Take 5,000 Units by mouth daily.   CHROMIUM PO Take 2 tablets by mouth 2 (two) times daily.   CINNAMON PO Take 125 mg by mouth 3 (three) times daily.   co-enzyme Q-10 50 MG capsule Take 100 mg by mouth daily.   Digestive Enzyme CAPS Take 1 capsule by mouth 3 (three) times daily with meals.   Fluticasone-Umeclidin-Vilant (TRELEGY ELLIPTA) 100-62.5-25 MCG/ACT AEPB Inhale 1 puff into the lungs daily.   Fluticasone-Umeclidin-Vilant (TRELEGY ELLIPTA) 100-62.5-25 MCG/ACT AEPB Inhale 1 puff into the lungs daily.   GARLIC PO Take 1 tablet by mouth 3 (three) times daily.   glucose blood test strip Use to check blood glucose once daily   hydrochlorothiazide (HYDRODIURIL) 25 MG tablet Take 1 tablet (25 mg total) by mouth  daily.   Lancets (ONETOUCH ULTRASOFT) lancets Use to check blood glucose once daily   loratadine (CLARITIN) 10 MG tablet Take 10 mg by mouth at bedtime.   Melatonin 10 MG CAPS Take 20 mg by mouth at bedtime.   Menaquinone-7 (VITAMIN K2 PO) Take 1 tablet by mouth daily.   metFORMIN (GLUCOPHAGE-XR) 500 MG 24 hr tablet Take 1 tablet (500 mg total) by mouth 2 (two) times daily.   Methylcobalamin 84696 MCG SOLR Inject 5,000 mcg as directed 2 (two) times a week. Tuesdays and Saturdays   MULTIPLE VITAMIN PO Take 2 capsules by mouth in the morning, at noon,  and at bedtime.   Omega-3 Fatty Acids (THE VERY FINEST FISH OIL) LIQD Take 15 mLs by mouth daily.   OVER THE COUNTER MEDICATION 10 mg at bedtime. CBD gummy.   pilocarpine (PILOCAR) 1 % ophthalmic solution Place 1 drop into the affected eye(s) once daily.   POTASSIUM IODIDE PO Take 225 mcg by mouth 3 (three) times daily.   Probiotic Product (PROBIOTIC PO) Take 1 capsule by mouth at bedtime.   QUERCETIN PO Take 1 tablet by mouth 3 (three) times daily.   simvastatin (ZOCOR) 20 MG tablet Take 1 tablet (20 mg total) by mouth daily.   thyroid (NP THYROID) 90 MG tablet Take 1/2 tablet by mouth daily   TURMERIC PO Take 1,500 mg by mouth 2 (two) times daily.   VITAMIN A PO Take 20,000 Units by mouth daily.   VITAMIN E PO Take 260 mg by mouth daily.   Zinc 50 MG TABS Take 50 mg by mouth daily.   No facility-administered encounter medications on file as of 04/19/2023.    Allergies (verified) Influenza vaccine live, Shrimp [shellfish allergy], Tdap [tetanus-diphth-acell pertussis], Demerol [meperidine], Peanut-containing drug products, Chlorhexidine gluconate [chlorhexidine], and Latex   History: Past Medical History:  Diagnosis Date   Allergy    Asthma    Breast cancer (HCC)    Bronchitis    Cancer (HCC)    Cataract 03/25/2020   Chronic kidney disease    COPD (chronic obstructive pulmonary disease) (HCC)    mild   Diabetes mellitus without complication (HCC)    Epstein Barr infection    GERD (gastroesophageal reflux disease) 2014   Goals of care, counseling/discussion 06/30/2020   History of hiatal hernia    History of kidney stones    Hyperlipidemia    Hypertension    stress induced/ work makes it worse.   Hypothyroidism    Neuromuscular disorder (HCC)    Neuropathy   Obesity    Pneumonia    Thyroid disease    Past Surgical History:  Procedure Laterality Date   BREAST BIOPSY     BREAST EXCISIONAL BIOPSY Left 2022   BREAST LUMPECTOMY     BREAST LUMPECTOMY WITH RADIOACTIVE SEED  AND SENTINEL LYMPH NODE BIOPSY Left 06/11/2020   Procedure: LEFT BREAST LUMPECTOMY WITH RADIOACTIVE SEED AND LEFT AXILLARY SENTINEL LYMPH NODE BIOPSY;  Surgeon: Emelia Loron, MD;  Location: [facility] OR;  Service: General;  Laterality: Left;   CATARACT EXTRACTION Right 10/02/2021   CATARACT EXTRACTION Left 07/31/2021   TONSILLECTOMY AND ADENOIDECTOMY  childhood   TUBAL LIGATION     VAGINAL HYSTERECTOMY  2011   Family History  Problem Relation Age of Onset   Heart disease Mother    Hyperlipidemia Mother    Stroke Mother    Hypertension Mother    Arthritis Mother    Hearing loss Mother    Varicose Veins Mother  Heart disease Father    Hyperlipidemia Father    Colon polyps Brother    Coronary artery disease Brother    Heart disease Brother    Prostate cancer Brother    Cancer Brother    Hyperlipidemia Brother    Hypertension Brother    Heart disease Brother    Hyperlipidemia Brother    Colon cancer Neg Hx    Stomach cancer Neg Hx    Rectal cancer Neg Hx    Esophageal cancer Neg Hx    Social History   Socioeconomic History   Marital status: Married    Spouse name: Not on file   Number of children: Not on file   Years of education: Not on file   Highest education level: Bachelor's degree (e.g., BA, AB, BS)  Occupational History   Occupation: RN  Tobacco Use   Smoking status: Never   Smokeless tobacco: Never  Vaping Use   Vaping status: Never Used  Substance and Sexual Activity   Alcohol use: No   Drug use: No   Sexual activity: Yes  Other Topics Concern   Not on file  Social History Narrative   Exercise- Walks about an hour a day   Right handed   Lives husband one story   Social Drivers of Health   Financial Resource Strain: Low Risk  (04/19/2023)   Overall Financial Resource Strain (CARDIA)    Difficulty of Paying Living Expenses: Not hard at all  Food Insecurity: Unknown (04/19/2023)   Hunger Vital Sign    Worried About Running Out of Food in the Last Year:  Patient declined    Ran Out of Food in the Last Year: Never true  Transportation Needs: No Transportation Needs (04/19/2023)   PRAPARE - Administrator, Civil Service (Medical): No    Lack of Transportation (Non-Medical): No  Physical Activity: Sufficiently Active (04/19/2023)   Exercise Vital Sign    Days of Exercise per Week: 4 days    Minutes of Exercise per Session: 40 min  Stress: Stress Concern Present (04/19/2023)   [employer] of Occupational Health - Occupational Stress Questionnaire    Feeling of Stress : To some extent  Social Connections: Socially Integrated (04/19/2023)   Social Connection and Isolation Panel [NHANES]    Frequency of Communication with Friends and Family: More than three times a week    Frequency of Social Gatherings with Friends and Family: Three times a week    Attends Religious Services: More than 4 times per year    Active Member of Clubs or Organizations: Yes    Attends Engineer, structural: More than 4 times per year    Marital Status: Married    Tobacco Counseling Counseling given: Not Answered   Clinical Intake:  Pre-visit preparation completed: Yes  Pain : No/denies pain     BMI - recorded: 28.52 Nutritional Status: BMI 25 -29 Overweight Nutritional Risks: None Diabetes: Yes CBG done?: No Did pt. bring in CBG monitor from home?: No  How often do you need to have someone help you when you read instructions, pamphlets, or other written materials from your doctor or pharmacy?: 1 - Never  Interpreter Needed?: No  Information entered by :: Theresa Mulligan LPN   Activities of Daily Living    04/19/2023    8:22 AM 04/18/2023    3:26 PM  In your present state of health, do you have any difficulty performing the following activities:  Hearing? 0 0  Vision?  0 0  Difficulty concentrating or making decisions? 0 0  Walking or climbing stairs? 0 0  Dressing or bathing? 0 0  Doing errands, shopping? 0 0  Preparing Food  and eating ? N N  Using the Toilet? N N  In the past six months, have you accidently leaked urine? Malvin Johns  Comment Wears panty Linners. Followed by GYN   Do you have problems with loss of bowel control? N N  Managing your Medications? N N  Managing your Finances? N N  Housekeeping or managing your Housekeeping? N N    Patient Care Team: Zola Button, Grayling Congress, DO as PCP - General (Family Medicine) Blima Ledger, OD (Optometry) Jeanann Lewandowsky, MD as Referring Physician (Family Medicine) Zelphia Cairo, MD as Consulting Physician (Obstetrics and Gynecology) Myna Hidalgo Rose Phi, MD as Oncology Nurse Navigator (Oncology) Gwendel Hanson, RN as Oncology Nurse Navigator  Indicate any recent Medical Services you may have received from other than [facility] providers in the past year (date may be approximate).     Assessment:   This is a routine wellness examination for Alyssia.  Hearing/Vision screen Hearing Screening - Comments:: Denies hearing difficulties   Vision Screening - Comments:: Wears reading glasses - up to date with routine eye exams with  Dr Blima Ledger   Goals Addressed               This Visit's Progress     Increase physical activity (pt-stated)        Lose weight.       Depression Screen    04/19/2023    8:32 AM 03/24/2022   11:03 AM 06/16/2021    9:31 AM 11/04/2020    9:49 AM 11/06/2019   10:41 AM 07/12/2017    3:32 PM 10/19/2016   11:30 AM  PHQ 2/9 Scores  PHQ - 2 Score 0 0 0 0 0 0 0    Fall Risk    04/19/2023    8:24 AM 04/18/2023    3:26 PM 03/23/2022    2:51 PM 06/16/2021    9:31 AM 11/04/2020    9:48 AM  Fall Risk   Falls in the past year? 0 0 0 0 0  Number falls in past yr: 0 0 0 0 0  Injury with Fall? 0 0 0 0 0  Risk for fall due to : No Fall Risks  No Fall Risks    Follow up Falls prevention discussed;Falls evaluation completed  Falls evaluation completed Falls evaluation completed Falls prevention discussed    MEDICARE RISK AT HOME: Medicare Risk at  Home Any stairs in or around the home?: Yes If so, are there any without handrails?: No Home free of loose throw rugs in walkways, pet beds, electrical cords, etc?: No Adequate lighting in your home to reduce risk of falls?: Yes Life alert?: No Use of a cane, walker or w/c?: No Grab bars in the bathroom?: No Shower chair or bench in shower?: No Elevated toilet seat or a handicapped toilet?: No  TIMED UP AND GO:  Was the test performed?  No    Cognitive Function:        04/19/2023    8:25 AM 03/24/2022   11:10 AM  6CIT Screen  What Year? 0 points 0 points  What month? 0 points 0 points  What time? 0 points 0 points  Count back from 20 0 points 0 points  Months in reverse 0 points 0 points  Repeat phrase 0 points  0 points  Total Score 0 points 0 points    Immunizations Immunization History  Administered Date(s) Administered   PFIZER(Purple Top)SARS-COV-2 Vaccination 04/07/2019, 05/01/2019   PNEUMOCOCCAL CONJUGATE-20 06/16/2021    TDAP status: Due, Education has been provided regarding the importance of this vaccine. Advised may receive this vaccine at local pharmacy or Health Dept. Aware to provide a copy of the vaccination record if obtained from local pharmacy or Health Dept. Verbalized acceptance and understanding.    Pneumococcal vaccine status: Up to date  Covid-19 vaccine status: Declined, Education has been provided regarding the importance of this vaccine but patient still declined. Advised may receive this vaccine at local pharmacy or Health Dept.or vaccine clinic. Aware to provide a copy of the vaccination record if obtained from local pharmacy or Health Dept. Verbalized acceptance and understanding.  Qualifies for Shingles Vaccine? Yes   Zostavax completed No   Shingrix Completed?: No.    Education has been provided regarding the importance of this vaccine. Patient has been advised to call insurance company to determine out of pocket expense if they have not yet  received this vaccine. Advised may also receive vaccine at local pharmacy or Health Dept. Verbalized acceptance and understanding.  Screening Tests Health Maintenance  Topic Date Due   DTaP/Tdap/Td (1 - Tdap) Never done   Zoster Vaccines- Shingrix (1 of 2) Never done   COVID-19 Vaccine (3 - Pfizer risk series) 05/29/2019   FOOT EXAM  12/30/2022   MAMMOGRAM  04/29/2023   HEMOGLOBIN A1C  06/29/2023   OPHTHALMOLOGY EXAM  09/15/2023   Diabetic kidney evaluation - eGFR measurement  12/30/2023   Diabetic kidney evaluation - Urine ACR  12/30/2023   Medicare Annual Wellness (AWV)  04/18/2024   DEXA SCAN  03/21/2025   Colonoscopy  11/12/2026   Pneumonia Vaccine 25+ Years old  Completed   Hepatitis C Screening  Completed   HPV VACCINES  Aged Out    Health Maintenance  Health Maintenance Due  Topic Date Due   DTaP/Tdap/Td (1 - Tdap) Never done   Zoster Vaccines- Shingrix (1 of 2) Never done   COVID-19 Vaccine (3 - Pfizer risk series) 05/29/2019   FOOT EXAM  12/30/2022    Colorectal cancer screening: Type of screening: Colonoscopy. Completed 11/11/16. Repeat every 10 years  Mammogram status: Ordered Scheduled for 05/02/23. Pt provided with contact info and advised to call to schedule appt.   Bone Density status: Completed 03/22/23. Results reflect: Bone density results: OSTEOPENIA. Repeat every   years.    Additional Screening:  Hepatitis C Screening: does qualify; Completed 01/29/15  Vision Screening: Recommended annual ophthalmology exams for early detection of glaucoma and other disorders of the eye. Is the patient up to date with their annual eye exam?  Yes  Who is the provider or what is the name of the office in which the patient attends annual eye exams? Dr Blima Ledger If pt is not established with a provider, would they like to be referred to a provider to establish care? No .   Dental Screening: Recommended annual dental exams for proper oral hygiene  Diabetic Foot Exam:  Diabetic Foot Exam: Overdue, Pt has been advised about the importance in completing this exam. Pt is scheduled for diabetic foot exam on Deferred.  Community Resource Referral / Chronic Care Management:  CRR required this visit?  No   CCM required this visit?  No     Plan:     I have personally reviewed and noted the  following in the patient's chart:   Medical and social history Use of alcohol, tobacco or illicit drugs  Current medications and supplements including opioid prescriptions. Patient is not currently taking opioid prescriptions. Functional ability and status Nutritional status Physical activity Advanced directives List of other physicians Hospitalizations, surgeries, and ER visits in previous 12 months Vitals Screenings to include cognitive, depression, and falls Referrals and appointments  In addition, I have reviewed and discussed with patient certain preventive protocols, quality metrics, and best practice recommendations. A written personalized care plan for preventive services as well as general preventive health recommendations were provided to patient.     Tillie Rung, LPN   07/24/6293   After Visit Summary: (MyChart) Due to this being a telephonic visit, the after visit summary with patients personalized plan was offered to patient via MyChart   Nurse Notes: None

## 2023-04-19 NOTE — Patient Instructions (Addendum)
 Michelle [name] , Thank you for taking time to come for your Medicare Wellness Visit. I appreciate your ongoing commitment to your health goals. Please review the following plan we discussed and let me know if I can assist you in the future.   Referrals/Orders/Follow-Ups/Clinician Recommendations:   This is a list of the screening recommended for you and due dates:  Health Maintenance  Topic Date Due   DTaP/Tdap/Td vaccine (1 - Tdap) Never done   Zoster (Shingles) Vaccine (1 of 2) Never done   COVID-19 Vaccine (3 - Pfizer risk series) 05/29/2019   Complete foot exam   12/30/2022   Mammogram  04/29/2023   Hemoglobin A1C  06/29/2023   Eye exam for diabetics  09/15/2023   Yearly kidney function blood test for diabetes  12/30/2023   Yearly kidney health urinalysis for diabetes  12/30/2023   Medicare Annual Wellness Visit  04/18/2024   DEXA scan (bone density measurement)  03/21/2025   Colon Cancer Screening  11/12/2026   Pneumonia Vaccine  Completed   Hepatitis C Screening  Completed   HPV Vaccine  Aged Out    Advanced directives: (Declined) Advance directive discussed with you today. Even though you declined this today, please call our office should you change your mind, and we can give you the proper paperwork for you to fill out.  Next Medicare Annual Wellness Visit scheduled for next year: Yes

## 2023-04-20 ENCOUNTER — Other Ambulatory Visit (HOSPITAL_BASED_OUTPATIENT_CLINIC_OR_DEPARTMENT_OTHER): Payer: Self-pay

## 2023-04-20 MED ORDER — METFORMIN HCL ER 500 MG PO TB24
500.0000 mg | ORAL_TABLET | Freq: Two times a day (BID) | ORAL | 1 refills | Status: DC
  Filled 2023-04-20: qty 180, 90d supply, fill #0
  Filled 2023-07-16: qty 180, 90d supply, fill #1

## 2023-04-22 ENCOUNTER — Other Ambulatory Visit: Payer: Self-pay | Admitting: Family Medicine

## 2023-04-22 ENCOUNTER — Other Ambulatory Visit (HOSPITAL_BASED_OUTPATIENT_CLINIC_OR_DEPARTMENT_OTHER): Payer: Self-pay

## 2023-04-22 DIAGNOSIS — E1169 Type 2 diabetes mellitus with other specified complication: Secondary | ICD-10-CM

## 2023-04-22 MED ORDER — SIMVASTATIN 20 MG PO TABS
20.0000 mg | ORAL_TABLET | Freq: Every day | ORAL | 1 refills | Status: DC
  Filled 2023-04-22 – 2023-05-03 (×2): qty 90, 90d supply, fill #0
  Filled 2023-08-12: qty 90, 90d supply, fill #1

## 2023-04-25 ENCOUNTER — Other Ambulatory Visit (HOSPITAL_BASED_OUTPATIENT_CLINIC_OR_DEPARTMENT_OTHER): Payer: Self-pay

## 2023-04-25 ENCOUNTER — Ambulatory Visit: Payer: PPO | Attending: General Surgery

## 2023-04-25 VITALS — Wt 164.5 lb

## 2023-04-25 DIAGNOSIS — Z483 Aftercare following surgery for neoplasm: Secondary | ICD-10-CM | POA: Insufficient documentation
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
## 2023-05-02 ENCOUNTER — Other Ambulatory Visit: Payer: Self-pay | Admitting: Family Medicine

## 2023-05-02 ENCOUNTER — Ambulatory Visit
Admission: RE | Admit: 2023-05-02 | Discharge: 2023-05-02 | Disposition: A | Discharge status: Home / Self Care | Payer: PPO | Source: Ambulatory Visit | Attending: Family Medicine | Admitting: Family Medicine

## 2023-05-02 ENCOUNTER — Ambulatory Visit
Admission: RE | Admit: 2023-05-02 | Discharge: 2023-05-02 | Disposition: A | Discharge status: Home / Self Care | Source: Ambulatory Visit | Attending: Family Medicine | Admitting: Family Medicine

## 2023-05-02 DIAGNOSIS — Z853 Personal history of malignant neoplasm of breast: Secondary | ICD-10-CM | POA: Diagnosis not present

## 2023-05-02 DIAGNOSIS — N6320 Unspecified lump in the left breast, unspecified quadrant: Secondary | ICD-10-CM

## 2023-05-02 DIAGNOSIS — N644 Mastodynia: Secondary | ICD-10-CM | POA: Diagnosis not present

## 2023-05-03 ENCOUNTER — Other Ambulatory Visit (HOSPITAL_BASED_OUTPATIENT_CLINIC_OR_DEPARTMENT_OTHER): Payer: Self-pay

## 2023-06-08 ENCOUNTER — Other Ambulatory Visit (HOSPITAL_BASED_OUTPATIENT_CLINIC_OR_DEPARTMENT_OTHER): Payer: Self-pay

## 2023-06-08 ENCOUNTER — Other Ambulatory Visit: Payer: Self-pay | Admitting: Family Medicine

## 2023-06-08 DIAGNOSIS — E785 Hyperlipidemia, unspecified: Secondary | ICD-10-CM

## 2023-06-08 DIAGNOSIS — I10 Essential (primary) hypertension: Secondary | ICD-10-CM

## 2023-06-08 DIAGNOSIS — Z923 Personal history of irradiation: Secondary | ICD-10-CM

## 2023-06-08 MED ORDER — AMLODIPINE BESYLATE 5 MG PO TABS
5.0000 mg | ORAL_TABLET | Freq: Every day | ORAL | 1 refills | Status: DC
  Filled 2023-06-08: qty 90, 90d supply, fill #0
  Filled 2023-09-15: qty 90, 90d supply, fill #1

## 2023-06-08 MED ORDER — HYDROCHLOROTHIAZIDE 25 MG PO TABS
25.0000 mg | ORAL_TABLET | Freq: Every day | ORAL | 1 refills | Status: DC
  Filled 2023-06-08: qty 90, 90d supply, fill #0
  Filled 2023-09-15: qty 90, 90d supply, fill #1

## 2023-06-13 ENCOUNTER — Other Ambulatory Visit: Payer: Self-pay

## 2023-06-13 DIAGNOSIS — C50412 Malignant neoplasm of upper-outer quadrant of left female breast: Secondary | ICD-10-CM

## 2023-06-14 ENCOUNTER — Inpatient Hospital Stay: Payer: PPO | Attending: Hematology & Oncology

## 2023-06-14 ENCOUNTER — Encounter: Payer: Self-pay | Admitting: Hematology & Oncology

## 2023-06-14 ENCOUNTER — Other Ambulatory Visit: Payer: Self-pay

## 2023-06-14 ENCOUNTER — Inpatient Hospital Stay (HOSPITAL_BASED_OUTPATIENT_CLINIC_OR_DEPARTMENT_OTHER): Payer: PPO | Admitting: Hematology & Oncology

## 2023-06-14 VITALS — BP 148/60 | HR 80 | Temp 98.0°F | Resp 18 | Ht 63.0 in | Wt 157.0 lb

## 2023-06-14 DIAGNOSIS — C50412 Malignant neoplasm of upper-outer quadrant of left female breast: Secondary | ICD-10-CM

## 2023-06-14 DIAGNOSIS — Z923 Personal history of irradiation: Secondary | ICD-10-CM | POA: Diagnosis not present

## 2023-06-14 DIAGNOSIS — Z1732 Human epidermal growth factor receptor 2 negative status: Secondary | ICD-10-CM | POA: Diagnosis not present

## 2023-06-14 DIAGNOSIS — Z170 Estrogen receptor positive status [ER+]: Secondary | ICD-10-CM | POA: Insufficient documentation

## 2023-06-14 DIAGNOSIS — Z79899 Other long term (current) drug therapy: Secondary | ICD-10-CM | POA: Insufficient documentation

## 2023-06-14 LAB — CBC WITH DIFFERENTIAL (CANCER CENTER ONLY)
Abs Immature Granulocytes: 0.01 10*3/uL (ref 0.00–0.07)
Basophils Absolute: 0.1 10*3/uL (ref 0.0–0.1)
Basophils Relative: 1 %
Eosinophils Absolute: 0.6 10*3/uL — ABNORMAL HIGH (ref 0.0–0.5)
Eosinophils Relative: 8 %
HCT: 42.1 % (ref 36.0–46.0)
Hemoglobin: 13.8 g/dL (ref 12.0–15.0)
Immature Granulocytes: 0 %
Lymphocytes Relative: 47 %
Lymphs Abs: 3.4 10*3/uL (ref 0.7–4.0)
MCH: 28.7 pg (ref 26.0–34.0)
MCHC: 32.8 g/dL (ref 30.0–36.0)
MCV: 87.5 fL (ref 80.0–100.0)
Monocytes Absolute: 0.7 10*3/uL (ref 0.1–1.0)
Monocytes Relative: 9 %
Neutro Abs: 2.6 10*3/uL (ref 1.7–7.7)
Neutrophils Relative %: 35 %
Platelet Count: 318 10*3/uL (ref 150–400)
RBC: 4.81 MIL/uL (ref 3.87–5.11)
RDW: 12.9 % (ref 11.5–15.5)
WBC Count: 7.4 10*3/uL (ref 4.0–10.5)
nRBC: 0 % (ref 0.0–0.2)

## 2023-06-14 LAB — CMP (CANCER CENTER ONLY)
ALT: 12 U/L (ref 0–44)
AST: 13 U/L — ABNORMAL LOW (ref 15–41)
Albumin: 3.7 g/dL (ref 3.5–5.0)
Alkaline Phosphatase: 31 U/L — ABNORMAL LOW (ref 38–126)
Anion gap: 9 (ref 5–15)
BUN: 13 mg/dL (ref 8–23)
CO2: 30 mmol/L (ref 22–32)
Calcium: 9.7 mg/dL (ref 8.9–10.3)
Chloride: 99 mmol/L (ref 98–111)
Creatinine: 0.75 mg/dL (ref 0.44–1.00)
GFR, Estimated: >60 mL/min (ref >60)
Glucose, Bld: 83 mg/dL (ref 70–99)
Potassium: 4.6 mmol/L (ref 3.5–5.1)
Sodium: 138 mmol/L (ref 135–145)
Total Bilirubin: 0.8 mg/dL (ref 0.0–1.2)
Total Protein: 7.2 g/dL (ref 6.5–8.1)

## 2023-06-14 LAB — LACTATE DEHYDROGENASE: LDH: 121 U/L (ref 98–192)

## 2023-06-14 NOTE — Progress Notes (Signed)
 Hematology and Oncology Follow Up Visit  Michelle [name] [id number] 1955-10-29 68 y.o. 06/14/2023   Principle Diagnosis:  Stage 1A (T1bN0M0) infiltrating ductal carcinoma of the LEFT breast- -ER positive/HER2 negative --Oncotype score of 17.  Current Therapy:   Status post left lumpectomy on 06/11/2020 Status post radiation therapy-completed on 08/19/2020 Femara  2.5 mg p.o. daily -she has not yet started     Interim History:  Michelle [name] is back for follow-up.  She actually just get back from [place] .  She was down there for a month.  She did with 5 of her 15 grandchildren.  She was has a good time down there.  She unfortunate had a concussion while she was down there.  She was playing with her grandkids and fell and hit her head on a concrete sidewalk.  Thankfully, she did not lose consciousness.  She has some visual changes.  She had little bit of unsteadiness.  This all seemed to improve.  Her mother passed away about 8 months ago.  This is still weighing on her.  She has had no issues with nausea or vomiting.  She has had no problems with cough or shortness of breath.  She has had no change in bowel or bladder habits.  There has been no rashes.  She has had no leg swelling.  She had an ultrasound/mammogram of the breast on 05/02/2023.  There is thought to be a possible abnormality in the left breast in the left axilla specifically.  An ultrasound was subsequently done which was negative.  Overall, I would have to say that her performance status is probably ECOG 1.  Medications:  Current Outpatient Medications:    albuterol  (VENTOLIN  HFA) 108 (90 Base) MCG/ACT inhaler, Inhale 2 puffs into the lungs every 6 (six) hours as needed for wheezing or shortness of breath., Disp: 6.7 g, Rfl: 0   Alpha-Lipoic Acid 600 MG TABS, Take 600 mg by mouth daily., Disp: , Rfl:    amLODipine  (NORVASC ) 5 MG tablet, Take 1 tablet (5 mg total) by mouth daily., Disp: 90 tablet, Rfl: 1   Ascorbic Acid  (VITAMIN C) 1000 MG tablet, Take 1,000 mg by mouth 2 (two) times daily., Disp: , Rfl:    blood glucose meter kit and supplies KIT, Use to check blood glucose once daily, Disp: 1 each, Rfl: 0   Calcium-Magnesium (CAL-MAG PO), Take 1 tablet by mouth 3 (three) times daily., Disp: , Rfl:    Cholecalciferol (DIALYVITE VITAMIN D  5000) 125 MCG (5000 UT) capsule, Take 5,000 Units by mouth daily., Disp: , Rfl:    CHROMIUM PO, Take 2 tablets by mouth 2 (two) times daily., Disp: , Rfl:    CINNAMON PO, Take 125 mg by mouth 3 (three) times daily., Disp: , Rfl:    co-enzyme Q-10 50 MG capsule, Take 100 mg by mouth daily., Disp: , Rfl:    Digestive Enzyme CAPS, Take 1 capsule by mouth 3 (three) times daily with meals., Disp: , Rfl:    Fluticasone -Umeclidin-Vilant (TRELEGY ELLIPTA ) 100-62.5-25 MCG/ACT AEPB, Inhale 1 puff into the lungs daily., Disp: 60 each, Rfl: 11   Fluticasone -Umeclidin-Vilant (TRELEGY ELLIPTA ) 100-62.5-25 MCG/ACT AEPB, Inhale 1 puff into the lungs daily., Disp: 60 each, Rfl: 3   GARLIC PO, Take 1 tablet by mouth 3 (three) times daily., Disp: , Rfl:    glucose blood test strip, Use to check blood glucose once daily, Disp: 100 each, Rfl: 0   hydrochlorothiazide  (HYDRODIURIL ) 25 MG tablet, Take 1 tablet (25 mg total)  by mouth daily., Disp: 90 tablet, Rfl: 1   Lancets (ONETOUCH ULTRASOFT) lancets, Use to check blood glucose once daily, Disp: 100 each, Rfl: 0   loratadine (CLARITIN) 10 MG tablet, Take 10 mg by mouth at bedtime., Disp: , Rfl:    Melatonin 10 MG CAPS, Take 20 mg by mouth at bedtime., Disp: , Rfl:    Menaquinone-7 (VITAMIN K2 PO), Take 1 tablet by mouth daily., Disp: , Rfl:    metFORMIN  (GLUCOPHAGE -XR) 500 MG 24 hr tablet, Take 1 tablet (500 mg total) by mouth 2 (two) times daily., Disp: 180 tablet, Rfl: 1   Methylcobalamin 50000 MCG SOLR, Inject 5,000 mcg as directed 2 (two) times a week. Tuesdays and Saturdays, Disp: , Rfl:    MULTIPLE VITAMIN PO, Take 2 capsules by mouth in the  morning, at noon, and at bedtime., Disp: , Rfl:    Omega-3 Fatty Acids (THE VERY FINEST FISH OIL) LIQD, Take 15 mLs by mouth daily., Disp: , Rfl:    OVER THE COUNTER MEDICATION, 10 mg at bedtime. CBD gummy., Disp: , Rfl:    pilocarpine  (PILOCAR) 1 % ophthalmic solution, Place 1 drop into the affected eye(s) once daily., Disp: 15 mL, Rfl: 0   POTASSIUM IODIDE PO, Take 225 mcg by mouth 3 (three) times daily., Disp: , Rfl:    Probiotic Product (PROBIOTIC PO), Take 1 capsule by mouth at bedtime., Disp: , Rfl:    QUERCETIN PO, Take 1 tablet by mouth 3 (three) times daily., Disp: , Rfl:    simvastatin  (ZOCOR ) 20 MG tablet, Take 1 tablet (20 mg total) by mouth daily., Disp: 90 tablet, Rfl: 1   thyroid  (NP THYROID ) 90 MG tablet, Take 1/2 tablet by mouth daily, Disp: 100 tablet, Rfl: 3   TURMERIC PO, Take 1,500 mg by mouth 2 (two) times daily., Disp: , Rfl:    VITAMIN A PO, Take 20,000 Units by mouth daily., Disp: , Rfl:    VITAMIN E PO, Take 260 mg by mouth daily., Disp: , Rfl:    Zinc 50 MG TABS, Take 50 mg by mouth daily., Disp: , Rfl:   Allergies:  Allergies  Allergen Reactions   Influenza Vaccine Live Other (See Comments)    Sever flu symptoms, chest tightness, wheezing   Shrimp [Shellfish Allergy ] Itching    Allergic to shrimp.  Wheezing and tightness in chest.   Tdap [Tetanus-Diphth-Acell Pertussis] Other (See Comments)    Change in level of consciousness, altered mental state   Demerol [Meperidine] Nausea And Vomiting   Peanut-Containing Drug Products Other (See Comments)    wheezing   Chlorhexidine  Gluconate [Chlorhexidine ] Rash    Patient states she is sensitive to CHG solution and prefers not to use it   Latex Rash    Past Medical History, Surgical history, Social history, and Family History were reviewed and updated.  Review of Systems: Review of Systems  Constitutional: Negative.   HENT:  Negative.    Eyes: Negative.   Respiratory: Negative.    Cardiovascular: Negative.    Gastrointestinal: Negative.   Endocrine: Negative.   Genitourinary: Negative.    Musculoskeletal: Negative.   Skin: Negative.   Neurological: Negative.   Hematological: Negative.   Psychiatric/Behavioral: Negative.      Physical Exam:  height is 5\' 3"  (1.6 m) and weight is 157 lb (71.2 kg). Her oral temperature is 98 F (36.7 C). Her blood pressure is 148/60 (abnormal) and her pulse is 80. Her respiration is 18 and oxygen saturation is 100%.  Wt Readings from Last 3 Encounters:  06/14/23 157 lb (71.2 kg)  04/25/23 164 lb 8 oz (74.6 kg)  04/19/23 161 lb (73 kg)    Physical Exam Vitals reviewed.  Constitutional:      Comments: Her breast exam shows right breast with no masses, edema or erythema.  There is no right axillary adenopathy.  Left breast shows the lumpectomy that is well-healed.  There is at the edge of the areola at about the 1 o'clock position.  This is well-healed.  She has no breast masses.  There are some hyperpigmentation on the left breast from radiation.  The hyperpigmentation is mostly noted in the lower aspect of the left breast.  There is no left axillary adenopathy.  HENT:     Head: Normocephalic and atraumatic.  Eyes:     Pupils: Pupils are equal, round, and reactive to light.  Cardiovascular:     Rate and Rhythm: Normal rate and regular rhythm.     Heart sounds: Normal heart sounds.  Pulmonary:     Effort: Pulmonary effort is normal.     Breath sounds: Normal breath sounds.  Abdominal:     General: Bowel sounds are normal.     Palpations: Abdomen is soft.  Musculoskeletal:        General: No tenderness or deformity. Normal range of motion.     Cervical back: Normal range of motion.  Lymphadenopathy:     Cervical: No cervical adenopathy.  Skin:    General: Skin is warm and dry.     Findings: No erythema or rash.  Neurological:     Mental Status: She is alert and oriented to person, place, and time.  Psychiatric:        Behavior: Behavior normal.         Thought Content: Thought content normal.        Judgment: Judgment normal.     Lab Results  Component Value Date   WBC 7.4 06/14/2023   HGB 13.8 06/14/2023   HCT 42.1 06/14/2023   MCV 87.5 06/14/2023   PLT 318 06/14/2023     Chemistry      Component Value Date/Time   NA 138 06/14/2023 0906   K 4.6 06/14/2023 0906   CL 99 06/14/2023 0906   CO2 30 06/14/2023 0906   BUN 13 06/14/2023 0906   CREATININE 0.75 06/14/2023 0906   CREATININE 0.74 11/06/2019 1113      Component Value Date/Time   CALCIUM 9.7 06/14/2023 0906   ALKPHOS 31 (L) 06/14/2023 0906   AST 13 (L) 06/14/2023 0906   ALT 12 06/14/2023 0906   BILITOT 0.8 06/14/2023 0906      Impression and Plan: Ms. Tango is a very nice 68 year old postmenopausal white female.  She has a early stage-stage IA-ductal carcinoma of the left breast.  She underwent lumpectomy .  She had a low Oncotype score.  She has completed radiation therapy.  She completed this in February.  Again, she is not on Femara .  She is taking her aromatase inhibitor natural products.  I am sorry that she had this concussion.  It sounds like she got through this without any difficulty.  As always, we will plan to see her back in 6 months.  I told her that we had to follow her up for at least 10 years.  This is the standard of practice.    Ivor Mars, MD 4/29/202510:03 AM

## 2023-06-30 ENCOUNTER — Other Ambulatory Visit: Payer: Self-pay

## 2023-06-30 ENCOUNTER — Telehealth: Payer: Self-pay

## 2023-06-30 ENCOUNTER — Ambulatory Visit: Payer: PPO | Admitting: Family Medicine

## 2023-06-30 ENCOUNTER — Other Ambulatory Visit (HOSPITAL_BASED_OUTPATIENT_CLINIC_OR_DEPARTMENT_OTHER): Payer: Self-pay

## 2023-06-30 ENCOUNTER — Encounter: Payer: Self-pay | Admitting: Family Medicine

## 2023-06-30 VITALS — BP 120/60 | HR 74 | Temp 98.1°F | Resp 18 | Ht 63.0 in | Wt 158.4 lb

## 2023-06-30 DIAGNOSIS — I10 Essential (primary) hypertension: Secondary | ICD-10-CM | POA: Diagnosis not present

## 2023-06-30 DIAGNOSIS — E785 Hyperlipidemia, unspecified: Secondary | ICD-10-CM | POA: Diagnosis not present

## 2023-06-30 DIAGNOSIS — E559 Vitamin D deficiency, unspecified: Secondary | ICD-10-CM | POA: Diagnosis not present

## 2023-06-30 DIAGNOSIS — E118 Type 2 diabetes mellitus with unspecified complications: Secondary | ICD-10-CM

## 2023-06-30 DIAGNOSIS — E1169 Type 2 diabetes mellitus with other specified complication: Secondary | ICD-10-CM

## 2023-06-30 DIAGNOSIS — E039 Hypothyroidism, unspecified: Secondary | ICD-10-CM | POA: Diagnosis not present

## 2023-06-30 LAB — COMPREHENSIVE METABOLIC PANEL WITH GFR
ALT: 14 U/L (ref 0–35)
AST: 16 U/L (ref 0–37)
Albumin: 4.8 g/dL (ref 3.5–5.2)
Alkaline Phosphatase: 43 U/L (ref 39–117)
BUN: 17 mg/dL (ref 6–23)
CO2: 32 meq/L (ref 19–32)
Calcium: 10.1 mg/dL (ref 8.4–10.5)
Chloride: 98 meq/L (ref 96–112)
Creatinine, Ser: 0.69 mg/dL (ref 0.40–1.20)
GFR: 88 mL/min (ref >60.00)
Glucose, Bld: 98 mg/dL (ref 70–99)
Potassium: 4.4 meq/L (ref 3.5–5.1)
Sodium: 139 meq/L (ref 135–145)
Total Bilirubin: 0.8 mg/dL (ref 0.2–1.2)
Total Protein: 7.2 g/dL (ref 6.0–8.3)

## 2023-06-30 LAB — CBC WITH DIFFERENTIAL/PLATELET
Basophils Absolute: 0.1 10*3/uL (ref 0.0–0.1)
Basophils Relative: 1 % (ref 0.0–3.0)
Eosinophils Absolute: 0.6 10*3/uL (ref 0.0–0.7)
Eosinophils Relative: 9.1 % — ABNORMAL HIGH (ref 0.0–5.0)
HCT: 40.7 % (ref 36.0–46.0)
Hemoglobin: 13.5 g/dL (ref 12.0–15.0)
Lymphocytes Relative: 36 % (ref 12.0–46.0)
Lymphs Abs: 2.2 10*3/uL (ref 0.7–4.0)
MCHC: 33.2 g/dL (ref 30.0–36.0)
MCV: 86.1 fl (ref 78.0–100.0)
Monocytes Absolute: 0.6 10*3/uL (ref 0.1–1.0)
Monocytes Relative: 9.2 % (ref 3.0–12.0)
Neutro Abs: 2.8 10*3/uL (ref 1.4–7.7)
Neutrophils Relative %: 44.7 % (ref 43.0–77.0)
Platelets: 334 10*3/uL (ref 150.0–400.0)
RBC: 4.72 Mil/uL (ref 3.87–5.11)
RDW: 13.1 % (ref 11.5–15.5)
WBC: 6.2 10*3/uL (ref 4.0–10.5)

## 2023-06-30 LAB — LIPID PANEL
Cholesterol: 151 mg/dL (ref 0–200)
HDL: 61.7 mg/dL (ref >39.00)
LDL Cholesterol: 73 mg/dL (ref 0–99)
NonHDL: 89.48
Total CHOL/HDL Ratio: 2
Triglycerides: 83 mg/dL (ref 0.0–149.0)
VLDL: 16.6 mg/dL (ref 0.0–40.0)

## 2023-06-30 LAB — HEMOGLOBIN A1C: Hgb A1c MFr Bld: 5.7 % (ref 4.6–6.5)

## 2023-06-30 LAB — VITAMIN D 25 HYDROXY (VIT D DEFICIENCY, FRACTURES): VITD: 110.09 ng/mL (ref 30.00–100.00)

## 2023-06-30 MED ORDER — TRELEGY ELLIPTA 100-62.5-25 MCG/ACT IN AEPB
1.0000 | INHALATION_SPRAY | Freq: Every day | RESPIRATORY_TRACT | 3 refills | Status: DC
  Filled 2023-06-30: qty 180, 90d supply, fill #0
  Filled 2023-06-30: qty 60, 30d supply, fill #0
  Filled 2023-10-03: qty 60, 30d supply, fill #1

## 2023-06-30 NOTE — Assessment & Plan Note (Signed)
 Encourage heart healthy diet such as MIND or DASH diet, increase exercise, avoid trans fats, simple carbohydrates and processed foods, consider a krill or fish or flaxseed oil cap daily.

## 2023-06-30 NOTE — Assessment & Plan Note (Signed)
 hgba1c to be checked, minimize simple carbs. Increase exercise as tolerated. Continue current meds
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
## 2023-06-30 NOTE — Telephone Encounter (Signed)
 CRITICAL VALUE STICKER  CRITICAL VALUE: Vitamin D - 110.09

## 2023-06-30 NOTE — Assessment & Plan Note (Signed)
 Well controlled, no changes to meds. Encouraged heart healthy diet such as the DASH diet and exercise as tolerated.

## 2023-07-01 LAB — THYROID PANEL WITH TSH
Free Thyroxine Index: 2.4 (ref 1.4–3.8)
T3 Uptake: 33 % (ref 22–35)
T4, Total: 7.4 ug/dL (ref 5.1–11.9)
TSH: 1.01 m[IU]/L (ref 0.40–4.50)

## 2023-07-01 LAB — MICROALBUMIN / CREATININE URINE RATIO
Creatinine,U: 25.3 mg/dL
Microalb Creat Ratio: UNDETERMINED mg/g (ref 0.0–30.0)
Microalb, Ur: <0.7 mg/dL

## 2023-07-01 NOTE — Telephone Encounter (Signed)
 Mychart message has been sent to pt.

## 2023-07-04 ENCOUNTER — Ambulatory Visit: Payer: Self-pay | Admitting: Family Medicine

## 2023-09-15 DIAGNOSIS — E119 Type 2 diabetes mellitus without complications: Secondary | ICD-10-CM | POA: Diagnosis not present

## 2023-09-15 DIAGNOSIS — D3131 Benign neoplasm of right choroid: Secondary | ICD-10-CM | POA: Diagnosis not present

## 2023-09-15 LAB — HM DIABETES EYE EXAM

## 2023-10-03 ENCOUNTER — Other Ambulatory Visit (HOSPITAL_BASED_OUTPATIENT_CLINIC_OR_DEPARTMENT_OTHER): Payer: Self-pay

## 2023-10-03 ENCOUNTER — Other Ambulatory Visit: Payer: Self-pay | Admitting: Family Medicine

## 2023-10-03 DIAGNOSIS — E1165 Type 2 diabetes mellitus with hyperglycemia: Secondary | ICD-10-CM

## 2023-10-03 MED ORDER — METFORMIN HCL ER 500 MG PO TB24
500.0000 mg | ORAL_TABLET | Freq: Two times a day (BID) | ORAL | 1 refills | Status: AC
  Filled 2023-10-03 – 2023-10-04 (×2): qty 180, 90d supply, fill #0
  Filled 2024-01-24: qty 180, 90d supply, fill #1

## 2023-10-04 ENCOUNTER — Other Ambulatory Visit: Payer: Self-pay

## 2023-10-04 ENCOUNTER — Other Ambulatory Visit: Payer: Self-pay | Admitting: Family Medicine

## 2023-10-04 ENCOUNTER — Other Ambulatory Visit (HOSPITAL_BASED_OUTPATIENT_CLINIC_OR_DEPARTMENT_OTHER): Payer: Self-pay

## 2023-10-04 MED ORDER — TRELEGY ELLIPTA 100-62.5-25 MCG/ACT IN AEPB
1.0000 | INHALATION_SPRAY | Freq: Every day | RESPIRATORY_TRACT | 0 refills | Status: DC
  Filled 2023-10-04 (×2): qty 180, 90d supply, fill #0

## 2023-10-05 ENCOUNTER — Other Ambulatory Visit: Payer: Self-pay

## 2023-10-24 ENCOUNTER — Ambulatory Visit: Attending: General Surgery

## 2023-10-24 VITALS — Wt 161.2 lb

## 2023-10-24 DIAGNOSIS — Z483 Aftercare following surgery for neoplasm: Secondary | ICD-10-CM | POA: Insufficient documentation
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
## 2023-11-02 ENCOUNTER — Other Ambulatory Visit: Payer: Self-pay | Admitting: Family Medicine

## 2023-11-02 ENCOUNTER — Other Ambulatory Visit (HOSPITAL_BASED_OUTPATIENT_CLINIC_OR_DEPARTMENT_OTHER): Payer: Self-pay

## 2023-11-02 DIAGNOSIS — E1169 Type 2 diabetes mellitus with other specified complication: Secondary | ICD-10-CM

## 2023-11-02 MED ORDER — SIMVASTATIN 20 MG PO TABS
20.0000 mg | ORAL_TABLET | Freq: Every day | ORAL | 1 refills | Status: AC
  Filled 2023-11-02: qty 90, 90d supply, fill #0
  Filled 2024-01-31: qty 90, 90d supply, fill #1

## 2023-11-03 ENCOUNTER — Encounter: Payer: Self-pay | Admitting: Pharmacist

## 2023-11-03 ENCOUNTER — Other Ambulatory Visit (HOSPITAL_BASED_OUTPATIENT_CLINIC_OR_DEPARTMENT_OTHER): Payer: Self-pay

## 2023-11-03 NOTE — Progress Notes (Signed)
 Pharmacy Quality Measure Review  This patient is appearing on a report for being at risk of failing the adherence measure for cholesterol (statin) medications this calendar year.   Medication: simvastatin  20mg  Last fill date: 08/12/2023 for 90 day supply per adherence report. Would appear that she is not yet due to refill yet but her last impactable date per adherence report is 11/09/2023.   Reviewed her refill history and patient did fill simvastatin  on time for 2025 so far.  90 day supply was filled the following dates - 03/04/2023; 05/03/2023, 08/12/2023. There is a prescription pending that was filled 11/02/2023 for 90 days so she should not fail MAC.   I reviewed her refill records and it appears that the refill from 05/03/2023 was not processed on her insurance. Looks like it was processed as cash - $14.60  Will likely need to fill once more to pass MAC for 2025 - Will monitor.     Insurance report was not up to date. No action needed at this time.   Madelin Ray, PharmD Clinical Pharmacist  Primary Care SW [facility]

## 2023-11-24 ENCOUNTER — Other Ambulatory Visit: Payer: Self-pay

## 2023-12-12 ENCOUNTER — Other Ambulatory Visit (HOSPITAL_BASED_OUTPATIENT_CLINIC_OR_DEPARTMENT_OTHER): Payer: Self-pay

## 2023-12-14 ENCOUNTER — Encounter: Payer: Self-pay | Admitting: Hematology & Oncology

## 2023-12-14 ENCOUNTER — Inpatient Hospital Stay: Admitting: Hematology & Oncology

## 2023-12-14 ENCOUNTER — Inpatient Hospital Stay: Attending: Hematology & Oncology

## 2023-12-14 VITALS — BP 131/61 | HR 78 | Temp 98.6°F | Resp 20 | Ht 63.0 in | Wt 159.1 lb

## 2023-12-14 DIAGNOSIS — Z170 Estrogen receptor positive status [ER+]: Secondary | ICD-10-CM

## 2023-12-14 DIAGNOSIS — Z79899 Other long term (current) drug therapy: Secondary | ICD-10-CM | POA: Insufficient documentation

## 2023-12-14 DIAGNOSIS — Z1732 Human epidermal growth factor receptor 2 negative status: Secondary | ICD-10-CM | POA: Insufficient documentation

## 2023-12-14 DIAGNOSIS — Z923 Personal history of irradiation: Secondary | ICD-10-CM | POA: Diagnosis not present

## 2023-12-14 DIAGNOSIS — Z1721 Progesterone receptor positive status: Secondary | ICD-10-CM | POA: Insufficient documentation

## 2023-12-14 DIAGNOSIS — C50412 Malignant neoplasm of upper-outer quadrant of left female breast: Secondary | ICD-10-CM

## 2023-12-14 LAB — CMP (CANCER CENTER ONLY)
ALT: 16 U/L (ref 0–44)
AST: 19 U/L (ref 15–41)
Albumin: 4.7 g/dL (ref 3.5–5.0)
Alkaline Phosphatase: 45 U/L (ref 38–126)
Anion gap: 10 (ref 5–15)
BUN: 16 mg/dL (ref 8–23)
CO2: 28 mmol/L (ref 22–32)
Calcium: 9.9 mg/dL (ref 8.9–10.3)
Chloride: 97 mmol/L — ABNORMAL LOW (ref 98–111)
Creatinine: 0.7 mg/dL (ref 0.44–1.00)
GFR, Estimated: >60 mL/min (ref >60)
Glucose, Bld: 114 mg/dL — ABNORMAL HIGH (ref 70–99)
Potassium: 4.7 mmol/L (ref 3.5–5.1)
Sodium: 134 mmol/L — ABNORMAL LOW (ref 135–145)
Total Bilirubin: 0.6 mg/dL (ref 0.0–1.2)
Total Protein: 7.3 g/dL (ref 6.5–8.1)

## 2023-12-14 LAB — CBC WITH DIFFERENTIAL (CANCER CENTER ONLY)
Abs Immature Granulocytes: 0.01 K/uL (ref 0.00–0.07)
Basophils Absolute: 0 K/uL (ref 0.0–0.1)
Basophils Relative: 1 %
Eosinophils Absolute: 0.4 K/uL (ref 0.0–0.5)
Eosinophils Relative: 5 %
HCT: 39.6 % (ref 36.0–46.0)
Hemoglobin: 13.1 g/dL (ref 12.0–15.0)
Immature Granulocytes: 0 %
Lymphocytes Relative: 37 %
Lymphs Abs: 3 K/uL (ref 0.7–4.0)
MCH: 28.7 pg (ref 26.0–34.0)
MCHC: 33.1 g/dL (ref 30.0–36.0)
MCV: 86.8 fL (ref 80.0–100.0)
Monocytes Absolute: 0.7 K/uL (ref 0.1–1.0)
Monocytes Relative: 8 %
Neutro Abs: 4 K/uL (ref 1.7–7.7)
Neutrophils Relative %: 49 %
Platelet Count: 329 K/uL (ref 150–400)
RBC: 4.56 MIL/uL (ref 3.87–5.11)
RDW: 12.8 % (ref 11.5–15.5)
WBC Count: 8.1 K/uL (ref 4.0–10.5)
nRBC: 0 % (ref 0.0–0.2)

## 2023-12-14 LAB — LACTATE DEHYDROGENASE: LDH: 143 U/L (ref 98–192)

## 2023-12-14 NOTE — Progress Notes (Signed)
 Hematology and Oncology Follow Up Visit  Michelle [name] [id number] 06/19/1955 68 y.o. 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  Current Therapy:   Status post left lumpectomy on 06/11/2020 Status post radiation therapy-completed on 08/19/2020      Interim History:  Michelle [name] is back for follow-up.  She has been quite busy.  Apparently, a son moved out to a new home.  This is over an hour away.  He should be helping him move.  She has been have a couple falls.  Thankfully, she has not broken anything.  One of her grandchildren got married down in [place] .  He had a wonderful service.  She had a wonderful time down there.  She is not taking the Femara .  She is using celery juice.  She says this has a much higher level of aromatase inhibitor potential.  She feels well.  She does have a little bit of fatigue.  She has had no change in bowel or bladder habits.  She has had no nausea or vomiting.  She has had no rashes.  She has had no headache.  Her last CA 27.29 was 10.4.  Overall, I would say that her performance status is probably ECOG 0.   .  Medications:  Current Outpatient Medications:    albuterol  (VENTOLIN  HFA) 108 (90 Base) MCG/ACT inhaler, Inhale 2 puffs into the lungs every 6 (six) hours as needed for wheezing or shortness of breath., Disp: 6.7 g, Rfl: 0   Alpha-Lipoic Acid 600 MG TABS, Take 600 mg by mouth daily., Disp: , Rfl:    amLODipine  (NORVASC ) 5 MG tablet, Take 1 tablet (5 mg total) by mouth daily., Disp: 90 tablet, Rfl: 1   Ascorbic Acid (VITAMIN C) 1000 MG tablet, Take 1,000 mg by mouth 2 (two) times daily., Disp: , Rfl:    Calcium-Magnesium (CAL-MAG PO), Take 1 tablet by mouth 3 (three) times daily., Disp: , Rfl:    Cholecalciferol (DIALYVITE VITAMIN D  5000) 125 MCG (5000 UT) capsule, Take 5,000 Units by mouth daily., Disp: , Rfl:    CHROMIUM PO,  Take 2 tablets by mouth 2 (two) times daily., Disp: , Rfl:    CINNAMON PO, Take 125 mg by mouth 3 (three) times daily., Disp: , Rfl:    co-enzyme Q-10 50 MG capsule, Take 100 mg by mouth daily., Disp: , Rfl:    Digestive Enzyme CAPS, Take 1 capsule by mouth 3 (three) times daily with meals., Disp: , Rfl:    Fluticasone -Umeclidin-Vilant (TRELEGY ELLIPTA ) 100-62.5-25 MCG/ACT AEPB, Inhale 1 puff into the lungs daily., Disp: 180 each, Rfl: 0   GARLIC PO, Take 1 tablet by mouth 3 (three) times daily., Disp: , Rfl:    hydrochlorothiazide  (HYDRODIURIL ) 25 MG tablet, Take 1 tablet (25 mg total) by mouth daily., Disp: 90 tablet, Rfl: 1   loratadine (CLARITIN) 10 MG tablet, Take 10 mg by mouth at bedtime., Disp: , Rfl:    Melatonin 10 MG CAPS, Take 20 mg by mouth at bedtime., Disp: , Rfl:    Menaquinone-7 (VITAMIN K2 PO), Take 1 tablet by mouth daily., Disp: , Rfl:    metFORMIN  (GLUCOPHAGE -XR) 500 MG 24 hr tablet, Take 1 tablet (500 mg total) by mouth 2 (two) times daily., Disp: 180 tablet, Rfl: 1   Methylcobalamin 50000 MCG SOLR, Inject 5,000 mcg as directed 2 (two) times a week. Tuesdays and Saturdays, Disp: , Rfl:  MULTIPLE VITAMIN PO, Take 2 capsules by mouth in the morning, at noon, and at bedtime., Disp: , Rfl:    OVER THE COUNTER MEDICATION, 10 mg at bedtime. CBD gummy., Disp: , Rfl:    OVER THE COUNTER MEDICATION, 2,150 mg 2 (two) times daily. ProOmega 2000-fish oil supplement, Disp: , Rfl:    pilocarpine  (PILOCAR) 1 % ophthalmic solution, Place 1 drop into the affected eye(s) once daily., Disp: 15 mL, Rfl: 0   POTASSIUM IODIDE PO, Take 225 mcg by mouth 3 (three) times daily., Disp: , Rfl:    PRESCRIPTION MEDICATION, 3 mg daily. Naltrexone, Disp: , Rfl:    Probiotic Product (PROBIOTIC PO), Take 1 capsule by mouth at bedtime., Disp: , Rfl:    QUERCETIN PO, Take 1 tablet by mouth 3 (three) times daily., Disp: , Rfl:    simvastatin  (ZOCOR ) 20 MG tablet, Take 1 tablet (20 mg total) by mouth daily.,  Disp: 90 tablet, Rfl: 1   thyroid  (NP THYROID ) 90 MG tablet, Take 1/2 tablet by mouth daily, Disp: 100 tablet, Rfl: 3   TURMERIC PO, Take 1,500 mg by mouth 2 (two) times daily., Disp: , Rfl:    VITAMIN A PO, Take 20,000 Units by mouth daily., Disp: , Rfl:    VITAMIN E PO, Take 260 mg by mouth daily., Disp: , Rfl:    Zinc 50 MG TABS, Take 50 mg by mouth daily., Disp: , Rfl:    blood glucose meter kit and supplies KIT, Use to check blood glucose once daily (Patient not taking: Reported on 12/14/2023), Disp: 1 each, Rfl: 0   glucose blood test strip, Use to check blood glucose once daily (Patient not taking: Reported on 12/14/2023), Disp: 100 each, Rfl: 0   Lancets (ONETOUCH ULTRASOFT) lancets, Use to check blood glucose once daily (Patient not taking: Reported on 12/14/2023), Disp: 100 each, Rfl: 0  Allergies:  Allergies  Allergen Reactions   Influenza Vaccine Live Other (See Comments)    Sever flu symptoms, chest tightness, wheezing   Shrimp [Shellfish Allergy ] Itching    Allergic to shrimp.  Wheezing and tightness in chest.   Tdap [Tetanus-Diphth-Acell Pertussis] Other (See Comments)    Change in level of consciousness, altered mental state   Demerol [Meperidine] Nausea And Vomiting   Peanut-Containing Drug Products Other (See Comments)    wheezing   Chlorhexidine  Gluconate [Chlorhexidine ] Rash    Patient states she is sensitive to CHG solution and prefers not to use it   Latex Rash    Past Medical History, Surgical history, Social history, and Family History were reviewed and updated.  Review of Systems: Review of Systems  Constitutional: Negative.   HENT:  Negative.    Eyes: Negative.   Respiratory: Negative.    Cardiovascular: Negative.   Gastrointestinal: Negative.   Endocrine: Negative.   Genitourinary: Negative.    Musculoskeletal: Negative.   Skin: Negative.   Neurological: Negative.   Hematological: Negative.   Psychiatric/Behavioral: Negative.      Physical  Exam:  height is 5' 3 (1.6 m) and weight is 159 lb 1.3 oz (72.2 kg). Her oral temperature is 98.6 F (37 C). Her blood pressure is 131/61 and her pulse is 78. Her respiration is 20 and oxygen saturation is 99%.   Wt Readings from Last 3 Encounters:  12/14/23 159 lb 1.3 oz (72.2 kg)  10/24/23 161 lb 4 oz (73.1 kg)  06/30/23 158 lb 6.4 oz (71.8 kg)    Physical Exam Vitals reviewed.  Constitutional:  Comments: Her breast exam shows right breast with no masses, edema or erythema.  There is no right axillary adenopathy.  Left breast shows the lumpectomy that is well-healed.  There is at the edge of the areola at about the 1 o'clock position.  This is well-healed.  She has no breast masses.  There are some hyperpigmentation on the left breast from radiation.  The hyperpigmentation is mostly noted in the lower aspect of the left breast.  There is no left axillary adenopathy.  HENT:     Head: Normocephalic and atraumatic.  Eyes:     Pupils: Pupils are equal, round, and reactive to light.  Cardiovascular:     Rate and Rhythm: Normal rate and regular rhythm.     Heart sounds: Normal heart sounds.  Pulmonary:     Effort: Pulmonary effort is normal.     Breath sounds: Normal breath sounds.  Abdominal:     General: Bowel sounds are normal.     Palpations: Abdomen is soft.  Musculoskeletal:        General: No tenderness or deformity. Normal range of motion.     Cervical back: Normal range of motion.  Lymphadenopathy:     Cervical: No cervical adenopathy.  Skin:    General: Skin is warm and dry.     Findings: No erythema or rash.  Neurological:     Mental Status: She is alert and oriented to person, place, and time.  Psychiatric:        Behavior: Behavior normal.        Thought Content: Thought content normal.        Judgment: Judgment normal.      Lab Results  Component Value Date   WBC 8.1 12/14/2023   HGB 13.1 12/14/2023   HCT 39.6 12/14/2023   MCV 86.8 12/14/2023   PLT  329 12/14/2023     Chemistry      Component Value Date/Time   NA 134 (L) 12/14/2023 0911   K 4.7 12/14/2023 0911   CL 97 (L) 12/14/2023 0911   CO2 28 12/14/2023 0911   BUN 16 12/14/2023 0911   CREATININE 0.70 12/14/2023 0911   CREATININE 0.74 11/06/2019 1113      Component Value Date/Time   CALCIUM 9.9 12/14/2023 0911   ALKPHOS 45 12/14/2023 0911   AST 19 12/14/2023 0911   ALT 16 12/14/2023 0911   BILITOT 0.6 12/14/2023 0911      Impression and Plan: Ms. Blas is a very nice 68 year old postmenopausal white female.  She has a early stage-stage IA-ductal carcinoma of the left breast.  She underwent lumpectomy .  She had a low Oncotype score.  She has completed radiation therapy.  She completed this in February.  She will not take the Femara .  Again she is taking celery juice which she says has a much higher level of aromatase inhibitor activity.  Everything looks good from my examination.  She is in great shape.  Hopefully, everything will continue to do well for her.  I know that she will be busy over the Holiday season.  They will be headed back down to [place]  for part of the holidays.  We will get her back in 6 months.   Maude JONELLE Crease, MD 10/29/202510:07 AM

## 2023-12-15 ENCOUNTER — Other Ambulatory Visit: Payer: Self-pay

## 2023-12-26 ENCOUNTER — Other Ambulatory Visit: Payer: Self-pay | Admitting: Family Medicine

## 2023-12-26 DIAGNOSIS — I10 Essential (primary) hypertension: Secondary | ICD-10-CM

## 2023-12-26 DIAGNOSIS — J208 Acute bronchitis due to other specified organisms: Secondary | ICD-10-CM

## 2023-12-26 DIAGNOSIS — E785 Hyperlipidemia, unspecified: Secondary | ICD-10-CM

## 2023-12-26 DIAGNOSIS — Z923 Personal history of irradiation: Secondary | ICD-10-CM

## 2023-12-27 ENCOUNTER — Other Ambulatory Visit (HOSPITAL_BASED_OUTPATIENT_CLINIC_OR_DEPARTMENT_OTHER): Payer: Self-pay

## 2023-12-27 MED ORDER — HYDROCHLOROTHIAZIDE 25 MG PO TABS
25.0000 mg | ORAL_TABLET | Freq: Every day | ORAL | 1 refills | Status: AC
  Filled 2023-12-27: qty 90, 90d supply, fill #0
  Filled 2024-03-23: qty 90, 90d supply, fill #1

## 2023-12-27 MED ORDER — ALBUTEROL SULFATE HFA 108 (90 BASE) MCG/ACT IN AERS
2.0000 | INHALATION_SPRAY | Freq: Four times a day (QID) | RESPIRATORY_TRACT | 5 refills | Status: AC | PRN
  Filled 2023-12-27 (×2): qty 6.7, 25d supply, fill #0

## 2023-12-27 MED ORDER — TRELEGY ELLIPTA 100-62.5-25 MCG/ACT IN AEPB
1.0000 | INHALATION_SPRAY | Freq: Every day | RESPIRATORY_TRACT | 1 refills | Status: AC
  Filled 2023-12-27: qty 180, 90d supply, fill #0
  Filled 2024-03-23: qty 180, 90d supply, fill #1

## 2023-12-27 MED ORDER — AMLODIPINE BESYLATE 5 MG PO TABS
5.0000 mg | ORAL_TABLET | Freq: Every day | ORAL | 1 refills | Status: AC
  Filled 2023-12-27 (×2): qty 90, 90d supply, fill #0
  Filled 2024-03-23: qty 90, 90d supply, fill #1

## 2024-01-05 ENCOUNTER — Ambulatory Visit: Admitting: Family Medicine

## 2024-01-05 ENCOUNTER — Other Ambulatory Visit: Payer: Self-pay | Admitting: Family Medicine

## 2024-01-05 ENCOUNTER — Encounter: Payer: Self-pay | Admitting: Family Medicine

## 2024-01-05 ENCOUNTER — Ambulatory Visit: Payer: Self-pay | Admitting: Family Medicine

## 2024-01-05 VITALS — BP 128/72 | HR 80 | Temp 98.2°F | Resp 16 | Ht 63.0 in | Wt 160.8 lb

## 2024-01-05 DIAGNOSIS — Z7984 Long term (current) use of oral hypoglycemic drugs: Secondary | ICD-10-CM

## 2024-01-05 DIAGNOSIS — E559 Vitamin D deficiency, unspecified: Secondary | ICD-10-CM

## 2024-01-05 DIAGNOSIS — E2839 Other primary ovarian failure: Secondary | ICD-10-CM

## 2024-01-05 DIAGNOSIS — I10 Essential (primary) hypertension: Secondary | ICD-10-CM

## 2024-01-05 DIAGNOSIS — E039 Hypothyroidism, unspecified: Secondary | ICD-10-CM | POA: Diagnosis not present

## 2024-01-05 DIAGNOSIS — E1165 Type 2 diabetes mellitus with hyperglycemia: Secondary | ICD-10-CM

## 2024-01-05 DIAGNOSIS — Z0000 Encounter for general adult medical examination without abnormal findings: Secondary | ICD-10-CM

## 2024-01-05 DIAGNOSIS — Z6829 Body mass index (BMI) 29.0-29.9, adult: Secondary | ICD-10-CM | POA: Diagnosis not present

## 2024-01-05 DIAGNOSIS — E785 Hyperlipidemia, unspecified: Secondary | ICD-10-CM

## 2024-01-05 DIAGNOSIS — Z01419 Encounter for gynecological examination (general) (routine) without abnormal findings: Secondary | ICD-10-CM | POA: Diagnosis not present

## 2024-01-05 LAB — COMPREHENSIVE METABOLIC PANEL WITH GFR
ALT: 16 U/L (ref 0–35)
AST: 19 U/L (ref 0–37)
Albumin: 4.6 g/dL (ref 3.5–5.2)
Alkaline Phosphatase: 45 U/L (ref 39–117)
BUN: 17 mg/dL (ref 6–23)
CO2: 31 meq/L (ref 19–32)
Calcium: 9.7 mg/dL (ref 8.4–10.5)
Chloride: 96 meq/L (ref 96–112)
Creatinine, Ser: 0.67 mg/dL (ref 0.40–1.20)
GFR: 88.3 mL/min (ref >60.00)
Glucose, Bld: 100 mg/dL — ABNORMAL HIGH (ref 70–99)
Potassium: 4.1 meq/L (ref 3.5–5.1)
Sodium: 135 meq/L (ref 135–145)
Total Bilirubin: 0.7 mg/dL (ref 0.2–1.2)
Total Protein: 7 g/dL (ref 6.0–8.3)

## 2024-01-05 LAB — CBC WITH DIFFERENTIAL/PLATELET
Basophils Absolute: 0.1 K/uL (ref 0.0–0.1)
Basophils Relative: 1.3 % (ref 0.0–3.0)
Eosinophils Absolute: 0.4 K/uL (ref 0.0–0.7)
Eosinophils Relative: 7.9 % — ABNORMAL HIGH (ref 0.0–5.0)
HCT: 40.3 % (ref 36.0–46.0)
Hemoglobin: 13.3 g/dL (ref 12.0–15.0)
Lymphocytes Relative: 42.9 % (ref 12.0–46.0)
Lymphs Abs: 2.3 K/uL (ref 0.7–4.0)
MCHC: 33 g/dL (ref 30.0–36.0)
MCV: 86.4 fl (ref 78.0–100.0)
Monocytes Absolute: 0.5 K/uL (ref 0.1–1.0)
Monocytes Relative: 10 % (ref 3.0–12.0)
Neutro Abs: 2 K/uL (ref 1.4–7.7)
Neutrophils Relative %: 37.9 % — ABNORMAL LOW (ref 43.0–77.0)
Platelets: 318 K/uL (ref 150.0–400.0)
RBC: 4.67 Mil/uL (ref 3.87–5.11)
RDW: 13.3 % (ref 11.5–15.5)
WBC: 5.4 K/uL (ref 4.0–10.5)

## 2024-01-05 LAB — LIPID PANEL
Cholesterol: 163 mg/dL (ref 0–200)
HDL: 67 mg/dL (ref >39.00)
LDL Cholesterol: 82 mg/dL (ref 0–99)
NonHDL: 95.9
Total CHOL/HDL Ratio: 2
Triglycerides: 71 mg/dL (ref 0.0–149.0)
VLDL: 14.2 mg/dL (ref 0.0–40.0)

## 2024-01-05 LAB — TSH: TSH: 0.89 u[IU]/mL (ref 0.35–5.50)

## 2024-01-05 LAB — HEMOGLOBIN A1C: Hgb A1c MFr Bld: 5.6 % (ref 4.6–6.5)

## 2024-01-05 LAB — VITAMIN D 25 HYDROXY (VIT D DEFICIENCY, FRACTURES): VITD: 95.93 ng/mL (ref 30.00–100.00)

## 2024-01-05 NOTE — Assessment & Plan Note (Signed)
 Check labs

## 2024-01-05 NOTE — Assessment & Plan Note (Signed)
 hgba1c to be checked, minimize simple carbs. Increase exercise as tolerated. Continue current meds

## 2024-01-05 NOTE — Progress Notes (Signed)
 Subjective:    Patient ID: Michelle [name], female    DOB: [birth year], 68 y.o.   MRN: [id number]  Chief Complaint  Patient presents with   Annual Exam    Pt states fasting     HPI Patient is in today for cpe.  Discussed the use of AI scribe software for clinical note transcription with the patient, who gave verbal consent to proceed.  History of Present Illness Michelle [name] is a 68 year old female who presents for an annual physical exam.  She takes metformin  for diabetes, Trelegy for COPD, and simvastatin  for hyperlipidemia. She reports that she has not experienced any lung issues while taking Trelegy.  She has fallen three times in the past year due to her active lifestyle, including tripping in a hole and over a dog. She experiences sharp pain in her left knee, especially when walking up steps, and reports a history of falling during a dog incident. She receives deep tissue body work once a month, which she finds beneficial.  She experiences swelling and pain in her foot, particularly in one part that gets swollen and painful. She suspects arthritis and mentions difficulty keeping up with her husband during walks due to his orthotic shoes.  She has a rough spot on the back of her thigh that she cannot see, which she describes as a little rough. She also has a scar on her knee from a fall in August.  She has not been getting COVID or flu shots and had a reaction to the pertussis component of the tetanus vaccine. She takes 5000 IU of vitamin D  daily, especially after feeling unwell following helping her son move in October. She notes that her immune system seems to improve with this dosage.    Past Medical History:  Diagnosis Date   Allergy     Asthma    Breast cancer (HCC)    Bronchitis    Cancer (HCC)    Cataract 03/25/2020   Chronic kidney disease    COPD (chronic obstructive pulmonary disease) (HCC)    mild   Diabetes mellitus without complication (HCC)    Epstein  Barr infection    GERD (gastroesophageal reflux disease) 2014   Goals of care, counseling/discussion 06/30/2020   History of hiatal hernia    History of kidney stones    Hyperlipidemia    Hypertension    stress induced/ work makes it worse.   Hypothyroidism    Neuromuscular disorder (HCC)    Neuropathy   Obesity    Pneumonia    Thyroid  disease     Past Surgical History:  Procedure Laterality Date   BREAST BIOPSY     BREAST EXCISIONAL BIOPSY Left 2022   BREAST LUMPECTOMY     BREAST LUMPECTOMY WITH RADIOACTIVE SEED AND SENTINEL LYMPH NODE BIOPSY Left 06/11/2020   Procedure: LEFT BREAST LUMPECTOMY WITH RADIOACTIVE SEED AND LEFT AXILLARY SENTINEL LYMPH NODE BIOPSY;  Surgeon: Ebbie Cough, MD;  Location: [facility] OR;  Service: General;  Laterality: Left;   CATARACT EXTRACTION Right 10/02/2021   CATARACT EXTRACTION Left 07/31/2021   TONSILLECTOMY AND ADENOIDECTOMY  childhood   TUBAL LIGATION     VAGINAL HYSTERECTOMY  2011    Family History  Problem Relation Age of Onset   Heart disease Mother    Hyperlipidemia Mother    Stroke Mother    Hypertension Mother    Arthritis Mother    Hearing loss Mother    Varicose Veins Mother    Heart disease  Father    Hyperlipidemia Father    Prostate cancer Brother    Coronary artery disease Brother    Colon polyps Brother    Cancer Brother    Hyperlipidemia Brother    Hypertension Brother    Heart disease Brother    Hyperlipidemia Brother    Multiple myeloma Brother    Colon cancer Neg Hx    Stomach cancer Neg Hx    Rectal cancer Neg Hx    Esophageal cancer Neg Hx     Social History   Socioeconomic History   Marital status: Married    Spouse name: Not on file   Number of children: Not on file   Years of education: Not on file   Highest education level: Bachelor's degree (e.g., BA, AB, BS)  Occupational History   Occupation: RN  Tobacco Use   Smoking status: Never   Smokeless tobacco: Never  Vaping Use   Vaping status:  Never Used  Substance and Sexual Activity   Alcohol use: No   Drug use: No   Sexual activity: Yes  Other Topics Concern   Not on file  Social History Narrative   Exercise- Walks about an hour a day   Right handed   Lives husband one story   Social Drivers of Health   Financial Resource Strain: Low Risk  (01/04/2024)   Overall Financial Resource Strain (CARDIA)    Difficulty of Paying Living Expenses: Not hard at all  Food Insecurity: No Food Insecurity (01/04/2024)   Hunger Vital Sign    Worried About Running Out of Food in the Last Year: Never true    Ran Out of Food in the Last Year: Never true  Transportation Needs: No Transportation Needs (01/04/2024)   PRAPARE - Administrator, Civil Service (Medical): No    Lack of Transportation (Non-Medical): No  Physical Activity: Insufficiently Active (01/04/2024)   Exercise Vital Sign    Days of Exercise per Week: 4 days    Minutes of Exercise per Session: 30 min  Stress: No Stress Concern Present (01/04/2024)   [employer] of Occupational Health - Occupational Stress Questionnaire    Feeling of Stress: Only a little  Social Connections: Socially Integrated (01/04/2024)   Social Connection and Isolation Panel    Frequency of Communication with Friends and Family: More than three times a week    Frequency of Social Gatherings with Friends and Family: Three times a week    Attends Religious Services: More than 4 times per year    Active Member of Clubs or Organizations: Yes    Attends Banker Meetings: 1 to 4 times per year    Marital Status: Married  Catering Manager Violence: Not At Risk (04/19/2023)   Humiliation, Afraid, Rape, and Kick questionnaire    Fear of Current or Ex-Partner: No    Emotionally Abused: No    Physically Abused: No    Sexually Abused: No    Outpatient Medications Prior to Visit  Medication Sig Dispense Refill   albuterol  (VENTOLIN  HFA) 108 (90 Base) MCG/ACT inhaler Inhale  2 puffs into the lungs every 6 (six) hours as needed for wheezing or shortness of breath. 6.7 g 5   Alpha-Lipoic Acid 600 MG TABS Take 600 mg by mouth daily.     amLODipine  (NORVASC ) 5 MG tablet Take 1 tablet (5 mg total) by mouth daily. 90 tablet 1   Ascorbic Acid (VITAMIN C) 1000 MG tablet Take 1,000 mg by mouth 2 (  two) times daily.     Calcium-Magnesium (CAL-MAG PO) Take 1 tablet by mouth 3 (three) times daily.     Cholecalciferol (DIALYVITE VITAMIN D  5000) 125 MCG (5000 UT) capsule Take 5,000 Units by mouth daily.     CHROMIUM PO Take 2 tablets by mouth 2 (two) times daily.     CINNAMON PO Take 125 mg by mouth 3 (three) times daily.     co-enzyme Q-10 50 MG capsule Take 100 mg by mouth daily.     Digestive Enzyme CAPS Take 1 capsule by mouth 3 (three) times daily with meals.     Fluticasone -Umeclidin-Vilant (TRELEGY ELLIPTA ) 100-62.5-25 MCG/ACT AEPB Inhale 1 puff into the lungs daily. 180 each 1   GARLIC PO Take 1 tablet by mouth 3 (three) times daily.     hydrochlorothiazide  (HYDRODIURIL ) 25 MG tablet Take 1 tablet (25 mg total) by mouth daily. 90 tablet 1   loratadine (CLARITIN) 10 MG tablet Take 10 mg by mouth at bedtime.     Melatonin 10 MG CAPS Take 20 mg by mouth at bedtime.     Menaquinone-7 (VITAMIN K2 PO) Take 1 tablet by mouth daily.     metFORMIN  (GLUCOPHAGE -XR) 500 MG 24 hr tablet Take 1 tablet (500 mg total) by mouth 2 (two) times daily. 180 tablet 1   Methylcobalamin 50000 MCG SOLR Inject 5,000 mcg as directed 2 (two) times a week. Tuesdays and Saturdays     MULTIPLE VITAMIN PO Take 2 capsules by mouth in the morning, at noon, and at bedtime.     OVER THE COUNTER MEDICATION 10 mg at bedtime. CBD gummy.     OVER THE COUNTER MEDICATION 2,150 mg 2 (two) times daily. ProOmega 2000-fish oil supplement     pilocarpine  (PILOCAR) 1 % ophthalmic solution Place 1 drop into the affected eye(s) once daily. 15 mL 0   POTASSIUM IODIDE PO Take 225 mcg by mouth 3 (three) times daily.      PRESCRIPTION MEDICATION 3 mg daily. Naltrexone     Probiotic Product (PROBIOTIC PO) Take 1 capsule by mouth at bedtime.     QUERCETIN PO Take 1 tablet by mouth 3 (three) times daily.     simvastatin  (ZOCOR ) 20 MG tablet Take 1 tablet (20 mg total) by mouth daily. 90 tablet 1   thyroid  (NP THYROID ) 90 MG tablet Take 1/2 tablet by mouth daily 100 tablet 3   TURMERIC PO Take 1,500 mg by mouth 2 (two) times daily.     VITAMIN A PO Take 20,000 Units by mouth daily.     VITAMIN E PO Take 260 mg by mouth daily.     Zinc 50 MG TABS Take 50 mg by mouth daily.     blood glucose meter kit and supplies KIT Use to check blood glucose once daily (Patient not taking: Reported on 01/05/2024) 1 each 0   glucose blood test strip Use to check blood glucose once daily (Patient not taking: Reported on 01/05/2024) 100 each 0   Lancets (ONETOUCH ULTRASOFT) lancets Use to check blood glucose once daily (Patient not taking: Reported on 01/05/2024) 100 each 0   No facility-administered medications prior to visit.    Allergies  Allergen Reactions   Influenza Vaccine Live Other (See Comments)    Sever flu symptoms, chest tightness, wheezing   Shrimp [Shellfish Allergy ] Itching    Allergic to shrimp.  Wheezing and tightness in chest.   Tdap [Tetanus-Diphth-Acell Pertussis] Other (See Comments)    Change in level of  consciousness, altered mental state   Demerol [Meperidine] Nausea And Vomiting   Peanut-Containing Drug Products Other (See Comments)    wheezing   Chlorhexidine  Gluconate [Chlorhexidine ] Rash    Patient states she is sensitive to CHG solution and prefers not to use it   Latex Rash    Review of Systems  Constitutional:  Negative for chills, fever and malaise/fatigue.  HENT:  Negative for congestion and hearing loss.   Eyes:  Negative for blurred vision and discharge.  Respiratory:  Negative for cough, sputum production and shortness of breath.   Cardiovascular:  Negative for chest pain,  palpitations and leg swelling.  Gastrointestinal:  Negative for abdominal pain, blood in stool, constipation, diarrhea, heartburn, nausea and vomiting.  Genitourinary:  Negative for dysuria, frequency, hematuria and urgency.  Musculoskeletal:  Negative for back pain, falls and myalgias.  Skin:  Negative for rash.  Neurological:  Negative for dizziness, sensory change, loss of consciousness, weakness and headaches.  Endo/Heme/Allergies:  Negative for environmental allergies. Does not bruise/bleed easily.  Psychiatric/Behavioral:  Negative for depression and suicidal ideas. The patient is not nervous/anxious and does not have insomnia.        Objective:    Physical Exam Vitals and nursing note reviewed.  Constitutional:      General: She is not in acute distress.    Appearance: Normal appearance. She is well-developed.  HENT:     Head: Normocephalic and atraumatic.     Right Ear: Tympanic membrane, ear canal and external ear normal. There is no impacted cerumen.     Left Ear: Tympanic membrane, ear canal and external ear normal. There is no impacted cerumen.     Nose: Nose normal.     Mouth/Throat:     Mouth: Mucous membranes are moist.     Pharynx: Oropharynx is clear. No oropharyngeal exudate or posterior oropharyngeal erythema.  Eyes:     General: No scleral icterus.       Right eye: No discharge.        Left eye: No discharge.     Conjunctiva/sclera: Conjunctivae normal.     Pupils: Pupils are equal, round, and reactive to light.  Neck:     Thyroid : No thyromegaly or thyroid  tenderness.     Vascular: No JVD.  Cardiovascular:     Rate and Rhythm: Normal rate and regular rhythm.     Heart sounds: Normal heart sounds. No murmur heard. Pulmonary:     Effort: Pulmonary effort is normal. No respiratory distress.     Breath sounds: Normal breath sounds.  Abdominal:     General: Bowel sounds are normal. There is no distension.     Palpations: Abdomen is soft. There is no mass.      Tenderness: There is no abdominal tenderness. There is no guarding or rebound.  Genitourinary:    Vagina: Normal.  Musculoskeletal:        General: Normal range of motion.     Cervical back: Normal range of motion and neck supple.     Right lower leg: No edema.     Left lower leg: No edema.  Lymphadenopathy:     Cervical: No cervical adenopathy.  Skin:    General: Skin is warm and dry.     Findings: No erythema or rash.  Neurological:     Mental Status: She is alert and oriented to person, place, and time.     Cranial Nerves: No cranial nerve deficit.     Deep Tendon Reflexes: Reflexes  are normal and symmetric.  Psychiatric:        Mood and Affect: Mood normal.        Behavior: Behavior normal.        Thought Content: Thought content normal.        Judgment: Judgment normal.     BP 128/72 (BP Location: Right Arm, Patient Position: Sitting, Cuff Size: Normal)   Pulse 80   Temp 98.2 F (36.8 C) (Oral)   Resp 16   Ht 5' 3 (1.6 m)   Wt 160 lb 12.8 oz (72.9 kg)   SpO2 97%   BMI 28.48 kg/m  Wt Readings from Last 3 Encounters:  01/05/24 160 lb 12.8 oz (72.9 kg)  12/14/23 159 lb 1.3 oz (72.2 kg)  10/24/23 161 lb 4 oz (73.1 kg)    Diabetic Foot Exam - Simple   No data filed    Lab Results  Component Value Date   WBC 8.1 12/14/2023   HGB 13.1 12/14/2023   HCT 39.6 12/14/2023   PLT 329 12/14/2023   GLUCOSE 114 (H) 12/14/2023   CHOL 151 06/30/2023   TRIG 83.0 06/30/2023   HDL 61.70 06/30/2023   LDLCALC 73 06/30/2023   ALT 16 12/14/2023   AST 19 12/14/2023   NA 134 (L) 12/14/2023   K 4.7 12/14/2023   CL 97 (L) 12/14/2023   CREATININE 0.70 12/14/2023   BUN 16 12/14/2023   CO2 28 12/14/2023   TSH 1.01 06/30/2023   HGBA1C 5.7 06/30/2023   MICROALBUR <0.7 06/30/2023    Lab Results  Component Value Date   TSH 1.01 06/30/2023   Lab Results  Component Value Date   WBC 8.1 12/14/2023   HGB 13.1 12/14/2023   HCT 39.6 12/14/2023   MCV 86.8 12/14/2023   PLT  329 12/14/2023   Lab Results  Component Value Date   NA 134 (L) 12/14/2023   K 4.7 12/14/2023   CO2 28 12/14/2023   GLUCOSE 114 (H) 12/14/2023   BUN 16 12/14/2023   CREATININE 0.70 12/14/2023   BILITOT 0.6 12/14/2023   ALKPHOS 45 12/14/2023   AST 19 12/14/2023   ALT 16 12/14/2023   PROT 7.3 12/14/2023   ALBUMIN 4.7 12/14/2023   CALCIUM 9.9 12/14/2023   ANIONGAP 10 12/14/2023   GFR 88.00 06/30/2023   Lab Results  Component Value Date   CHOL 151 06/30/2023   Lab Results  Component Value Date   HDL 61.70 06/30/2023   Lab Results  Component Value Date   LDLCALC 73 06/30/2023   Lab Results  Component Value Date   TRIG 83.0 06/30/2023   Lab Results  Component Value Date   CHOLHDL 2 06/30/2023   Lab Results  Component Value Date   HGBA1C 5.7 06/30/2023       Assessment & Plan:  Preventative health care Assessment & Plan: Ghm utd Check labs See AVS Health Maintenance  Topic Date Due   Zoster Vaccines- Shingrix (1 of 2) Never done   FOOT EXAM  12/30/2022   HEMOGLOBIN A1C  12/31/2023   COVID-19 Vaccine (3 - Pfizer risk series) 01/21/2024 (Originally 05/29/2019)   DTaP/Tdap/Td (1 - Tdap) 01/04/2025 (Originally 03/09/1974)   Medicare Annual Wellness (AWV)  04/18/2024   Mammogram  05/01/2024   Diabetic kidney evaluation - Urine ACR  06/29/2024   OPHTHALMOLOGY EXAM  09/14/2024   Diabetic kidney evaluation - eGFR measurement  12/13/2024   Bone Density Scan  03/21/2025   Colonoscopy  11/12/2026   Pneumococcal Vaccine:  50+ Years  Completed   Hepatitis C Screening  Completed   Meningococcal B Vaccine  Aged Out      Type 2 diabetes mellitus with hyperglycemia, without long-term current use of insulin  (HCC) Assessment & Plan: hgba1c to be checked, minimize simple carbs. Increase exercise as tolerated. Continue current meds   Orders: -     Ambulatory referral to Podiatry -     Comprehensive metabolic panel with GFR -     Hemoglobin A1c  Hyperlipidemia LDL  goal <100 -     CBC with Differential/Platelet -     Comprehensive metabolic panel with GFR -     Lipid panel -     TSH  Primary hypertension Assessment & Plan: Well controlled, no changes to meds. Encouraged heart healthy diet such as the DASH diet and exercise as tolerated.    Orders: -     Comprehensive metabolic panel with GFR -     Lipid panel  Acquired hypothyroidism Assessment & Plan: Check labs   Orders: -     TSH  Vitamin D  deficiency -     VITAMIN D  25 Hydroxy (Vit-D Deficiency, Fractures)   Assessment and Plan Assessment & Plan Adult Wellness Visit   During her routine adult wellness visit, there were no significant changes in family history except for her brother's multiple myeloma diagnosis. She reported several new concerns and symptoms, including foot pain and swelling, left knee pain after a fall, concerns about vitamin D  levels, and a rough spot on the back of her thigh. Continue current medications: metformin , amlodipine , hydrochlorothiazide , Trelegy, and simvastatin . Ordered labs for routine monitoring.  Type 2 diabetes mellitus   Her diabetes is well-controlled with metformin , though she has not recently monitored her blood glucose due to a stable condition. Continue metformin  and encourage regular blood glucose monitoring.  Essential hypertension   Her hypertension is well-controlled with the current medication regimen. Continue amlodipine  and hydrochlorothiazide .  Hyperlipidemia   Her hyperlipidemia is managed with simvastatin . Continue simvastatin .  Hypothyroidism   No specific discussion or changes in management during this visit.  Vitamin D  deficiency   She manages her vitamin D  deficiency with supplementation but reports feeling unwell after reducing intake. Ordered vitamin D  level and continue supplementation as needed.  Left knee pain after fall   She experiences intermittent sharp pain in her left knee after a fall, worsened by walking up  steps, possibly related to joint issues. Continue body work for pain management.  Foot pain and swelling   She has foot pain and swelling, possibly due to arthritis or improper footwear. Discussed potential benefits of orthotic shoes and inserts. Referred to a podiatrist for evaluation and management. Consider orthotic shoes and inserts for support.  Dellar Traber R Lowne Chase, DO

## 2024-01-05 NOTE — Assessment & Plan Note (Signed)
 Ghm utd Check labs See AVS Health Maintenance  Topic Date Due   Zoster Vaccines- Shingrix (1 of 2) Never done   FOOT EXAM  12/30/2022   HEMOGLOBIN A1C  12/31/2023   COVID-19 Vaccine (3 - Pfizer risk series) 01/21/2024 (Originally 05/29/2019)   DTaP/Tdap/Td (1 - Tdap) 01/04/2025 (Originally 03/09/1974)   Medicare Annual Wellness (AWV)  04/18/2024   Mammogram  05/01/2024   Diabetic kidney evaluation - Urine ACR  06/29/2024   OPHTHALMOLOGY EXAM  09/14/2024   Diabetic kidney evaluation - eGFR measurement  12/13/2024   Bone Density Scan  03/21/2025   Colonoscopy  11/12/2026   Pneumococcal Vaccine: 50+ Years  Completed   Hepatitis C Screening  Completed   Meningococcal B Vaccine  Aged Out

## 2024-01-05 NOTE — Assessment & Plan Note (Signed)
 Well controlled, no changes to meds. Encouraged heart healthy diet such as the DASH diet and exercise as tolerated.

## 2024-01-25 ENCOUNTER — Telehealth: Payer: Self-pay | Admitting: *Deleted

## 2024-01-25 DIAGNOSIS — M722 Plantar fascial fibromatosis: Secondary | ICD-10-CM

## 2024-01-25 NOTE — Telephone Encounter (Signed)
 I left a message for the patient asking her to stop by imaging tomorrow for xrays prior to coming to us  for her appointment.  I advised her to enter through the back entrance.

## 2024-01-25 NOTE — Telephone Encounter (Signed)
 Called and informed patient that we will need xrays of her feet.

## 2024-01-26 ENCOUNTER — Telehealth: Payer: Self-pay | Admitting: Family Medicine

## 2024-01-26 ENCOUNTER — Ambulatory Visit

## 2024-01-26 ENCOUNTER — Other Ambulatory Visit (HOSPITAL_BASED_OUTPATIENT_CLINIC_OR_DEPARTMENT_OTHER): Payer: Self-pay

## 2024-01-26 ENCOUNTER — Encounter: Payer: Self-pay | Admitting: Podiatry

## 2024-01-26 ENCOUNTER — Ambulatory Visit: Admitting: Podiatry

## 2024-01-26 DIAGNOSIS — E1142 Type 2 diabetes mellitus with diabetic polyneuropathy: Secondary | ICD-10-CM

## 2024-01-26 DIAGNOSIS — M7662 Achilles tendinitis, left leg: Secondary | ICD-10-CM

## 2024-01-26 DIAGNOSIS — M722 Plantar fascial fibromatosis: Secondary | ICD-10-CM | POA: Diagnosis not present

## 2024-01-26 DIAGNOSIS — M21611 Bunion of right foot: Secondary | ICD-10-CM | POA: Diagnosis not present

## 2024-01-26 MED ORDER — MELOXICAM 15 MG PO TABS
15.0000 mg | ORAL_TABLET | Freq: Every day | ORAL | 0 refills | Status: AC
  Filled 2024-01-26: qty 30, 30d supply, fill #0

## 2024-01-26 NOTE — Patient Instructions (Signed)

## 2024-01-26 NOTE — Telephone Encounter (Signed)
 Received

## 2024-01-26 NOTE — Telephone Encounter (Signed)
 Pt dropped of forms to be completed by pcp. Please call pt to pickup forms when ready. Placed in pcp's bin in fo.

## 2024-01-26 NOTE — Progress Notes (Signed)
 Subjective:  Patient ID: Michelle [name], female    DOB: 1955/04/15,   MRN: [id number]  Chief Complaint  Patient presents with   Foot Pain    I have a bunion on my right foot.  I am having pain on the back of my left heel.  I'm type 2 Diabetic.  I wonder if she can prescribe some shoes for me. Saw Dr. Antonio Meth - 01/05/2024; A1c - 5.6    68 y.o. female presents for diabetic foot check.  She also relates concern for right foot bunion that has been bothering her for some time now.  She relates pain off and on and sometimes at night in the area.  She also relates concern for left heel pain that has been ongoing for several months.  She relates pain in the back of her heel.  Certain shoes tend to bother her more.  She denies much treatment for this aside from anti-inflammatories which helped some but not much.  She is wondering about diabetic shoes.  Relates burning and tingling in their feet. Patient is diabetic and last A1c was  Lab Results  Component Value Date   HGBA1C 5.6 01/05/2024   .   PCP:  Antonio Meth, Jamee SAUNDERS, DO    . Denies any other pedal complaints. Denies n/v/f/c.   Past Medical History:  Diagnosis Date   Allergy     Asthma    Breast cancer (HCC)    Bronchitis    Cancer (HCC)    Cataract 03/25/2020   Chronic kidney disease    COPD (chronic obstructive pulmonary disease) (HCC)    mild   Diabetes mellitus without complication (HCC)    Epstein Barr infection    GERD (gastroesophageal reflux disease) 2014   Goals of care, counseling/discussion 06/30/2020   History of hiatal hernia    History of kidney stones    Hyperlipidemia    Hypertension    stress induced/ work makes it worse.   Hypothyroidism    Neuromuscular disorder (HCC)    Neuropathy   Obesity    Pneumonia    Thyroid  disease     Objective:  Physical Exam: Vascular: DP/PT pulses 2/4 bilateral. CFT <3 seconds. Feet cold to touch. Absent hair growth on digits. Edema noted to bilateral lower  extremities. Xerosis noted bilaterally.  Skin. No lacerations or abrasions bilateral feet. Nails 1-5 bilateral mostly normal in appearance.  There is some discoloration noted to the bilateral great toes. Musculoskeletal: MMT 5/5 bilateral lower extremities in DF, PF, Inversion and Eversion. Deceased ROM in DF of ankle joint.  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  Some pain with range of motion of the first MPJ no first ray hypermobility noted.  Tenderness to the left Achilles tendon proximal watershed area.  Mild bulging of the tendon.  Tendon is intact.  Negative Thompson test. Neurological: Sensation intact to light touch. Protective sensation intact bilateral.    Assessment:   1. Tendonitis, Achilles, left   2. Bunion, right   3. Type 2 diabetes mellitus with peripheral neuropathy (HCC)      Plan:  Patient was evaluated and treated and all questions answered. -Xrays reviewed.  No acute fractures or dislocations noted.  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  Bilateral mild spurring noted of posterior heel with Haglund's deformity noted bilateral. -Discussed HAV and treatment options;conservative and surgical management;  risks, benefits, alternatives discussed. All patient's questions answered. -Discussed padding and wide shoe gear.   -Recommend continue with good supportive shoes and inserts.  -Discussed surgical options if needed in future  -Discussed Achilles insertional tendonitis and treatment options with patient.  -Discussed stretching exercises. -Rx Meloxicam provided  -Discussed if no improvement will consider MRI/PT/EPAT/PRP injections.  -Discussed and educated patient on diabetic foot care, especially with  regards to the vascular, neurological and musculoskeletal systems.  -Stressed the importance of good glycemic control and the detriment of not  controlling  glucose levels in relation to the foot. -Discussed supportive shoes at all times and checking feet regularly.  - Has diabetic shoes and prescription provided to [facility] -Answered all patient questions -Patient to return in 6 weeks for recheck of Achilles tendon. -Patient advised to call the office if any problems or questions arise in the meantime.   Asberry Failing, DPM

## 2024-02-01 NOTE — Telephone Encounter (Signed)
 Pt called. LVM advising form was ready for pickup. Copy placed to scan.

## 2024-02-03 ENCOUNTER — Other Ambulatory Visit (HOSPITAL_BASED_OUTPATIENT_CLINIC_OR_DEPARTMENT_OTHER): Payer: Self-pay

## 2024-02-06 ENCOUNTER — Encounter: Payer: Self-pay | Admitting: Pharmacist

## 2024-02-06 NOTE — Progress Notes (Signed)
 Pharmacy Quality Measure Review  This patient is appearing on a report for being at risk of failing the adherence measure for cholesterol (statin) medications this calendar year.   Medication: simvastatin   Last fill date: 11/02/2023 for 90 day supply per adherence report.   Reviewed Epic records. Patient filled simvastatin  01/31/2024 for 90 Days at [facility] pharmacy.  Insurance report was not up to date. No action needed at this time.   Madelin Ray, PharmD Clinical Pharmacist [facility]

## 2024-02-23 ENCOUNTER — Other Ambulatory Visit (HOSPITAL_BASED_OUTPATIENT_CLINIC_OR_DEPARTMENT_OTHER): Payer: Self-pay

## 2024-02-23 MED ORDER — PILOCARPINE HCL 1 % OP SOLN
1.0000 [drp] | Freq: Every day | OPHTHALMIC | 0 refills | Status: AC
  Filled 2024-02-23: qty 15, 100d supply, fill #0

## 2024-02-24 ENCOUNTER — Other Ambulatory Visit (HOSPITAL_BASED_OUTPATIENT_CLINIC_OR_DEPARTMENT_OTHER): Payer: Self-pay

## 2024-02-27 ENCOUNTER — Other Ambulatory Visit (HOSPITAL_BASED_OUTPATIENT_CLINIC_OR_DEPARTMENT_OTHER): Payer: Self-pay

## 2024-03-22 ENCOUNTER — Other Ambulatory Visit: Payer: Self-pay | Admitting: Hematology & Oncology

## 2024-03-22 DIAGNOSIS — Z853 Personal history of malignant neoplasm of breast: Secondary | ICD-10-CM

## 2024-03-23 ENCOUNTER — Other Ambulatory Visit: Payer: Self-pay

## 2024-03-23 ENCOUNTER — Other Ambulatory Visit (HOSPITAL_BASED_OUTPATIENT_CLINIC_OR_DEPARTMENT_OTHER): Payer: Self-pay

## 2024-04-19 ENCOUNTER — Other Ambulatory Visit (HOSPITAL_BASED_OUTPATIENT_CLINIC_OR_DEPARTMENT_OTHER)

## 2024-04-23 ENCOUNTER — Ambulatory Visit

## 2024-04-24 ENCOUNTER — Ambulatory Visit

## 2024-05-03 ENCOUNTER — Encounter

## 2024-06-14 ENCOUNTER — Inpatient Hospital Stay: Admitting: Hematology & Oncology

## 2024-06-14 ENCOUNTER — Inpatient Hospital Stay

## 2024-07-05 ENCOUNTER — Ambulatory Visit: Admitting: Family Medicine
